# Patient Record
Sex: Male | Born: 1940
Health system: Southern US, Community
[De-identification: ages and names within clinical notes are randomized; demographics above are authoritative.]

## PROBLEM LIST (undated history)

## (undated) DIAGNOSIS — M1990 Unspecified osteoarthritis, unspecified site: Secondary | ICD-10-CM

## (undated) DIAGNOSIS — C679 Malignant neoplasm of bladder, unspecified: Secondary | ICD-10-CM

## (undated) DIAGNOSIS — K219 Gastro-esophageal reflux disease without esophagitis: Secondary | ICD-10-CM

## (undated) DIAGNOSIS — I10 Essential (primary) hypertension: Secondary | ICD-10-CM

## (undated) DIAGNOSIS — Z8719 Personal history of other diseases of the digestive system: Secondary | ICD-10-CM

## (undated) DIAGNOSIS — G4700 Insomnia, unspecified: Secondary | ICD-10-CM

## (undated) DIAGNOSIS — R011 Cardiac murmur, unspecified: Secondary | ICD-10-CM

## (undated) HISTORY — PX: JOINT REPLACEMENT: SHX530

## (undated) HISTORY — PX: TONSILLECTOMY: SUR1361

## (undated) HISTORY — PX: SECONDARY CLOSURE ARM: SHX5151

## (undated) HISTORY — PX: KNEE ARTHROSCOPY: SUR90

## (undated) HISTORY — PX: TRANSTHORACIC ECHOCARDIOGRAM: SHX275

## (undated) HISTORY — PX: OTHER SURGICAL HISTORY: SHX169

## (undated) HISTORY — PX: CARDIOVASCULAR STRESS TEST: SHX262

---

## 2000-09-21 ENCOUNTER — Emergency Department (HOSPITAL_COMMUNITY): Admission: EM | Admit: 2000-09-21 | Discharge: 2000-09-21 | Payer: Self-pay | Admitting: Emergency Medicine

## 2000-09-21 ENCOUNTER — Encounter: Payer: Self-pay | Admitting: Emergency Medicine

## 2001-04-01 ENCOUNTER — Encounter: Payer: Self-pay | Admitting: Internal Medicine

## 2001-04-01 ENCOUNTER — Encounter: Admission: RE | Admit: 2001-04-01 | Discharge: 2001-04-01 | Payer: Self-pay | Admitting: Internal Medicine

## 2001-07-16 ENCOUNTER — Encounter: Payer: Self-pay | Admitting: Orthopedic Surgery

## 2001-07-18 ENCOUNTER — Observation Stay (HOSPITAL_COMMUNITY): Admission: RE | Admit: 2001-07-18 | Discharge: 2001-07-19 | Payer: Self-pay | Admitting: Orthopedic Surgery

## 2001-07-18 HISTORY — PX: SHOULDER OPEN ROTATOR CUFF REPAIR: SHX2407

## 2005-04-16 ENCOUNTER — Emergency Department (HOSPITAL_COMMUNITY): Admission: EM | Admit: 2005-04-16 | Discharge: 2005-04-16 | Payer: Self-pay | Admitting: Family Medicine

## 2005-05-22 ENCOUNTER — Ambulatory Visit (HOSPITAL_COMMUNITY): Admission: RE | Admit: 2005-05-22 | Discharge: 2005-05-22 | Payer: Self-pay | Admitting: *Deleted

## 2005-07-12 ENCOUNTER — Encounter: Admission: RE | Admit: 2005-07-12 | Discharge: 2005-07-12 | Payer: Self-pay | Admitting: Internal Medicine

## 2005-11-23 ENCOUNTER — Ambulatory Visit (HOSPITAL_COMMUNITY): Admission: RE | Admit: 2005-11-23 | Discharge: 2005-11-23 | Payer: Self-pay | Admitting: Family Medicine

## 2005-11-23 ENCOUNTER — Emergency Department (HOSPITAL_COMMUNITY): Admission: EM | Admit: 2005-11-23 | Discharge: 2005-11-23 | Payer: Self-pay | Admitting: Family Medicine

## 2009-03-15 ENCOUNTER — Ambulatory Visit (HOSPITAL_COMMUNITY): Admission: RE | Admit: 2009-03-15 | Discharge: 2009-03-16 | Payer: Self-pay | Admitting: Orthopedic Surgery

## 2009-03-15 HISTORY — PX: OTHER SURGICAL HISTORY: SHX169

## 2010-02-19 ENCOUNTER — Emergency Department (HOSPITAL_COMMUNITY)
Admission: EM | Admit: 2010-02-19 | Discharge: 2010-02-19 | Payer: Self-pay | Source: Home / Self Care | Admitting: Emergency Medicine

## 2010-04-30 LAB — DIFFERENTIAL
Basophils Relative: 1 % (ref 0–1)
Eosinophils Absolute: 0.1 10*3/uL (ref 0.0–0.7)
Eosinophils Relative: 1 % (ref 0–5)
Lymphs Abs: 1.7 10*3/uL (ref 0.7–4.0)
Monocytes Relative: 5 % (ref 3–12)

## 2010-04-30 LAB — COMPREHENSIVE METABOLIC PANEL
ALT: 21 U/L (ref 0–53)
AST: 21 U/L (ref 0–37)
Alkaline Phosphatase: 51 U/L (ref 39–117)
CO2: 29 mEq/L (ref 19–32)
Calcium: 9.1 mg/dL (ref 8.4–10.5)
GFR calc Af Amer: >60 mL/min (ref >60)
Potassium: 4 mEq/L (ref 3.5–5.1)
Sodium: 140 mEq/L (ref 135–145)
Total Protein: 7.5 g/dL (ref 6.0–8.3)

## 2010-04-30 LAB — URINALYSIS, ROUTINE W REFLEX MICROSCOPIC
Bilirubin Urine: NEGATIVE
Glucose, UA: NEGATIVE mg/dL
Ketones, ur: NEGATIVE mg/dL
Leukocytes, UA: NEGATIVE
Nitrite: NEGATIVE
Protein, ur: NEGATIVE mg/dL
Specific Gravity, Urine: 1.011 (ref 1.005–1.030)
Urobilinogen, UA: 0.2 mg/dL (ref 0.0–1.0)
pH: 6.5 (ref 5.0–8.0)

## 2010-04-30 LAB — URINE MICROSCOPIC-ADD ON

## 2010-04-30 LAB — CBC
Hemoglobin: 13.6 g/dL (ref 13.0–17.0)
MCHC: 34.3 g/dL (ref 30.0–36.0)
RBC: 4.29 MIL/uL (ref 4.22–5.81)

## 2010-06-30 NOTE — Op Note (Signed)
[facility]  Patient:    Jeremiah Wright, Jeremiah Wright Visit Number: [id number] MRN: [id number]          Service Type: SUR Location: 4W [id number] [id number] Attending Physician:  Skip Mayer Dictated by:   Georges Lynch Darrelyn Hillock, M.D. Proc. Date: 07/18/01 Admit Date:  07/18/2001                             Operative Report  SURGEON:  Windy Fast A. Darrelyn Hillock, M.D.  ASSISTANTDruscilla Brownie. Underwood III, P.A.-C.  PREOPERATIVE DIAGNOSIS:  Complete retracted tear of the rotator cuff tendon, right shoulder with severe impingement syndrome.  POSTOPERATIVE DIAGNOSIS:  Complete retracted tear to the rotator cuff tendon, right shoulder with severe impingement syndrome.  OPERATION: 1. Repair of a complete tear of the right rotator cuff tendon utilizing the    graft-jacket tendon graft. 2. Partial acromionectomy and acromioplasty.  DESCRIPTION OF PROCEDURE:  Under general anesthesia, the patient first had 1 g of IV Ancef.  A routine orthopedic prep and draping of the right shoulder was carried out.  An incision was made over the anterior aspect of the right shoulder, and bleeders were identified and cauterized.  Note, prior to surgery, the patient had an interscalene block of his neck.  Once the incision was made, I went down to the deltoid tendon and stripped it off by sharp dissection from the acromion.  At this particular time, the muscle then was split proximally.  I then introduced the Bennett retractor and did an acromionectomy and acromioplasty utilizing the oscillating saw and a bur.  I then thoroughly irrigated out the area and bone waxed the undersurface of the acromion.  I did a bursectomy as well.  Note, he had a large, retracted tear of the rotator cuff tendon.  I then utilized the bur to bur down lateral articular surface of the humerus and also to bur off the spur that was present anteriorly.  Once this was done, I then utilized a 5 x 5 graft-jacket to repair the defect  in the rotator cuff tendon.  I also utilized two multi-tack sutures.  The tendon graft down into the bone.  I thoroughly irrigated out the area and then reapproximated the deltoid tendon and the muscle in the usual fashion, and the subcu was closed in the usual fashion.  Skin was closed with metal staples, and a sterile Neosporin dressing was applied, and he was placed in a shoulder immobilizer. Dictated by:   Georges Lynch Darrelyn Hillock, M.D. Attending Physician:  Skip Mayer DD:  07/18/01 TD:  07/21/01 Job: [id number]

## 2010-06-30 NOTE — Op Note (Signed)
NAMENUEL, DEJAYNES                  ACCOUNT NO.:  [id number]   MEDICAL RECORD NO.:  [id number]          PATIENT TYPE:  AMB   LOCATION:  ENDO                         FACILITY:  [facility]   PHYSICIAN:  Georgiana Spinner, M.D.    DATE OF BIRTH:  [birth year]   DATE OF PROCEDURE:  05/22/2005  DATE OF DISCHARGE:                                 OPERATIVE REPORT   PROCEDURE:  Colonoscopy.   INDICATIONS:  Colon cancer screening.   PROCEDURE:  With the patient mildly sedated in the left lateral decubitus  position, a rectal examination was performed which revealed trace positive  material.  The prostate could not be well felt.  Subsequently, the Olympus  videoscopic colonoscope was inserted into the rectum and passed under direct  vision.  With pressure applied and position change, we reached the cecum  identified by the ileocecal valve and base of cecum both of which were  photographed.  From this point, the colonoscope was slowly withdrawn, taking  circumferential views of the colonic mucosa, stopping to photograph  significant diverticulosis of the sigmoid colon until we reached the rectum  which appeared normal under direct and showed small hemorrhoids on  retroflexed view.  The endoscope was straightened and withdrawn.  The  patient's vital signs and pulse oximeter remained stable.  The patient  tolerated the procedure well with no apparent complications.   FINDINGS:  Diverticulosis of sigmoid colon.  Internal hemorrhoids.  Otherwise unremarkable examination.   PLAN:  Consider repeat exam in 5 years.           ______________________________  Georgiana Spinner, M.D.     GMO/MEDQ  D:  05/22/2005  T:  05/22/2005  Job:  [id number]

## 2010-08-15 ENCOUNTER — Other Ambulatory Visit: Payer: Self-pay | Admitting: Internal Medicine

## 2010-08-15 DIAGNOSIS — R112 Nausea with vomiting, unspecified: Secondary | ICD-10-CM

## 2010-08-18 ENCOUNTER — Ambulatory Visit
Admission: RE | Admit: 2010-08-18 | Discharge: 2010-08-18 | Disposition: A | Discharge status: Home / Self Care | Payer: 59 | Source: Ambulatory Visit | Attending: Internal Medicine | Admitting: Internal Medicine

## 2010-08-18 DIAGNOSIS — R112 Nausea with vomiting, unspecified: Secondary | ICD-10-CM

## 2011-10-29 ENCOUNTER — Other Ambulatory Visit: Payer: Self-pay | Admitting: Orthopedic Surgery

## 2011-11-20 ENCOUNTER — Ambulatory Visit (HOSPITAL_COMMUNITY)
Admission: RE | Admit: 2011-11-20 | Discharge: 2011-11-20 | Disposition: A | Discharge status: Home / Self Care | Payer: Medicare Other | Source: Ambulatory Visit | Attending: Orthopedic Surgery | Admitting: Orthopedic Surgery

## 2011-11-20 ENCOUNTER — Encounter (HOSPITAL_COMMUNITY): Payer: Self-pay | Admitting: Pharmacy Technician

## 2011-11-20 ENCOUNTER — Encounter (HOSPITAL_COMMUNITY): Payer: Self-pay

## 2011-11-20 ENCOUNTER — Encounter (HOSPITAL_COMMUNITY)
Admission: RE | Admit: 2011-11-20 | Discharge: 2011-11-20 | Disposition: A | Discharge status: Home / Self Care | Payer: Medicare Other | Source: Ambulatory Visit | Attending: Orthopedic Surgery | Admitting: Orthopedic Surgery

## 2011-11-20 DIAGNOSIS — S43429A Sprain of unspecified rotator cuff capsule, initial encounter: Secondary | ICD-10-CM | POA: Insufficient documentation

## 2011-11-20 DIAGNOSIS — I10 Essential (primary) hypertension: Secondary | ICD-10-CM | POA: Insufficient documentation

## 2011-11-20 DIAGNOSIS — X58XXXA Exposure to other specified factors, initial encounter: Secondary | ICD-10-CM | POA: Insufficient documentation

## 2011-11-20 DIAGNOSIS — Z01812 Encounter for preprocedural laboratory examination: Secondary | ICD-10-CM | POA: Insufficient documentation

## 2011-11-20 HISTORY — DX: Essential (primary) hypertension: I10

## 2011-11-20 HISTORY — DX: Unspecified osteoarthritis, unspecified site: M19.90

## 2011-11-20 HISTORY — DX: Gastro-esophageal reflux disease without esophagitis: K21.9

## 2011-11-20 LAB — PROTIME-INR
INR: 1.06 (ref 0.00–1.49)
Prothrombin Time: 13.7 seconds (ref 11.6–15.2)

## 2011-11-20 LAB — SURGICAL PCR SCREEN: Staphylococcus aureus: NEGATIVE

## 2011-11-20 LAB — ABO/RH: ABO/RH(D): A NEG

## 2011-11-20 NOTE — Progress Notes (Signed)
11/20/11 1515  OBSTRUCTIVE SLEEP APNEA  Have you ever been diagnosed with sleep apnea through a sleep study? No  Do you snore loudly (loud enough to be heard through closed doors)?  0  Do you often feel tired, fatigued, or sleepy during the daytime? 0  Has anyone observed you stop breathing during your sleep? 0  Do you have, or are you being treated for high blood pressure? 1  BMI more than 35 kg/m2? 1  Age over 70 years old? 1  Neck circumference greater than 40 cm/18 inches? 0  Gender: 1  Obstructive Sleep Apnea Score 4   Score 4 or greater  Results sent to PCP

## 2011-11-20 NOTE — Patient Instructions (Signed)
Jeremiah Wright  11/20/2011                           YOUR PROCEDURE IS SCHEDULED ON:  11/29/11 7:30 AM               PLEASE REPORT TO SHORT STAY CENTER AT :  5:15 AM               CALL THIS NUMBER IF ANY PROBLEMS THE DAY OF SURGERY :               [id number]                      REMEMBER:   Do not eat food or drink liquids AFTER MIDNIGHT    Take these medicines the morning of surgery with A SIP OF WATER:  PRILOSEC / TRAMADOL   Do not wear jewelry, make-up   Do not wear lotions, powders, or perfumes.   Do not shave legs or underarms 12 hrs. before surgery (men may shave face)  Do not bring valuables to the hospital.  Contacts, dentures or bridgework may not be worn into surgery.  Leave suitcase in the car. After surgery it may be brought to your room.  For patients admitted to the hospital more than one night, checkout time is 11:00                         AM the day of discharge.   Patients discharged the day of surgery will not be allowed to drive home                             If going home same day of surgery, must have someone stay with you first                           4 hrs at home and arrange for some one to drive you home from hospital.    Special Instructions:   Please read over the following fact sheets that you were given:               1. MRSA  INFORMATION                      2. [employer] PREPARING FOR SURGERY SHEET                3. INCENTIVE SPIROMETER                                                  X_____________________________________________________________________

## 2011-11-29 ENCOUNTER — Encounter (HOSPITAL_COMMUNITY): Payer: Self-pay | Admitting: Anesthesiology

## 2011-11-29 ENCOUNTER — Ambulatory Visit (HOSPITAL_COMMUNITY): Payer: Medicare Other | Admitting: Anesthesiology

## 2011-11-29 ENCOUNTER — Encounter (HOSPITAL_COMMUNITY): Payer: Self-pay | Admitting: Orthopedic Surgery

## 2011-11-29 ENCOUNTER — Encounter (HOSPITAL_COMMUNITY): Payer: Self-pay | Admitting: *Deleted

## 2011-11-29 ENCOUNTER — Inpatient Hospital Stay (HOSPITAL_COMMUNITY): Payer: Medicare Other

## 2011-11-29 ENCOUNTER — Inpatient Hospital Stay (HOSPITAL_COMMUNITY)
Admission: RE | Admit: 2011-11-29 | Discharge: 2011-11-30 | DRG: 484 | Disposition: A | Discharge status: Home / Self Care | Payer: Medicare Other | Source: Ambulatory Visit | Attending: Orthopedic Surgery | Admitting: Orthopedic Surgery

## 2011-11-29 ENCOUNTER — Encounter (HOSPITAL_COMMUNITY)
Admission: RE | Disposition: A | Discharge status: Home / Self Care | Payer: Self-pay | Source: Ambulatory Visit | Attending: Orthopedic Surgery

## 2011-11-29 ENCOUNTER — Ambulatory Visit (HOSPITAL_COMMUNITY): Payer: Medicare Other

## 2011-11-29 DIAGNOSIS — M19019 Primary osteoarthritis, unspecified shoulder: Principal | ICD-10-CM | POA: Diagnosis present

## 2011-11-29 DIAGNOSIS — I10 Essential (primary) hypertension: Secondary | ICD-10-CM | POA: Diagnosis present

## 2011-11-29 DIAGNOSIS — M19012 Primary osteoarthritis, left shoulder: Secondary | ICD-10-CM | POA: Diagnosis present

## 2011-11-29 HISTORY — PX: REVERSE SHOULDER ARTHROPLASTY: SHX5054

## 2011-11-29 LAB — TYPE AND SCREEN: ABO/RH(D): A NEG

## 2011-11-29 SURGERY — ARTHROPLASTY, SHOULDER, TOTAL, REVERSE
Anesthesia: General | Site: Shoulder | Laterality: Left | Wound class: Clean

## 2011-11-29 MED ORDER — SODIUM CHLORIDE 0.9 % IR SOLN
Status: DC | PRN
  Administered 2011-11-29: 1000 mL

## 2011-11-29 MED ORDER — PANTOPRAZOLE SODIUM 40 MG PO TBEC
40.0000 mg | DELAYED_RELEASE_TABLET | Freq: Every day | ORAL | Status: DC
  Administered 2011-11-30: 40 mg via ORAL
  Filled 2011-11-29: qty 1

## 2011-11-29 MED ORDER — ACETAMINOPHEN 325 MG PO TABS: 650.0000 mg | ORAL_TABLET | Freq: Four times a day (QID) | ORAL | Status: DC | PRN

## 2011-11-29 MED ORDER — METHOCARBAMOL 100 MG/ML IJ SOLN
500.0000 mg | Freq: Four times a day (QID) | INTRAVENOUS | Status: DC | PRN
  Administered 2011-11-29: 500 mg via INTRAVENOUS
  Filled 2011-11-29: qty 5

## 2011-11-29 MED ORDER — MEPERIDINE HCL 50 MG/ML IJ SOLN: 6.2500 mg | INTRAMUSCULAR | Status: DC | PRN

## 2011-11-29 MED ORDER — MENTHOL 3 MG MT LOZG
1.0000 | LOZENGE | OROMUCOSAL | Status: DC | PRN
  Administered 2011-11-29: 3 mg via ORAL
  Filled 2011-11-29: qty 9

## 2011-11-29 MED ORDER — ACETAMINOPHEN 10 MG/ML IV SOLN
INTRAVENOUS | Status: AC
  Filled 2011-11-29: qty 100

## 2011-11-29 MED ORDER — LIDOCAINE HCL 4 % MT SOLN
OROMUCOSAL | Status: DC | PRN
  Administered 2011-11-29: 4 mL via TOPICAL

## 2011-11-29 MED ORDER — DIPHENHYDRAMINE HCL 12.5 MG/5ML PO ELIX: 12.5000 mg | ORAL_SOLUTION | ORAL | Status: DC | PRN

## 2011-11-29 MED ORDER — METHOCARBAMOL 500 MG PO TABS
500.0000 mg | ORAL_TABLET | Freq: Four times a day (QID) | ORAL | Status: DC | PRN
  Administered 2011-11-29 – 2011-11-30 (×3): 500 mg via ORAL
  Filled 2011-11-29 (×3): qty 1

## 2011-11-29 MED ORDER — IRBESARTAN 150 MG PO TABS
150.0000 mg | ORAL_TABLET | Freq: Every day | ORAL | Status: DC
  Administered 2011-11-29 – 2011-11-30 (×2): 150 mg via ORAL
  Filled 2011-11-29 (×2): qty 1

## 2011-11-29 MED ORDER — MORPHINE SULFATE 2 MG/ML IJ SOLN: 2.0000 mg | INTRAMUSCULAR | Status: DC | PRN

## 2011-11-29 MED ORDER — ONDANSETRON HCL 4 MG/2ML IJ SOLN: 4.0000 mg | Freq: Four times a day (QID) | INTRAMUSCULAR | Status: DC | PRN

## 2011-11-29 MED ORDER — POVIDONE-IODINE 7.5 % EX SOLN: Freq: Once | CUTANEOUS | Status: DC

## 2011-11-29 MED ORDER — PROMETHAZINE HCL 25 MG/ML IJ SOLN: 6.2500 mg | INTRAMUSCULAR | Status: DC | PRN

## 2011-11-29 MED ORDER — HYDROMORPHONE HCL PF 1 MG/ML IJ SOLN
0.2500 mg | INTRAMUSCULAR | Status: DC | PRN
Start: 2011-11-29 — End: 2011-11-29
  Administered 2011-11-29 (×2): 0.5 mg via INTRAVENOUS

## 2011-11-29 MED ORDER — NEOSTIGMINE METHYLSULFATE 1 MG/ML IJ SOLN
INTRAMUSCULAR | Status: DC | PRN
  Administered 2011-11-29: 2 mg via INTRAVENOUS

## 2011-11-29 MED ORDER — PHENOL 1.4 % MT LIQD
1.0000 | OROMUCOSAL | Status: DC | PRN
  Filled 2011-11-29: qty 177

## 2011-11-29 MED ORDER — ACETAMINOPHEN 650 MG RE SUPP: 650.0000 mg | Freq: Four times a day (QID) | RECTAL | Status: DC | PRN

## 2011-11-29 MED ORDER — CEFAZOLIN SODIUM-DEXTROSE 2-3 GM-% IV SOLR
2.0000 g | Freq: Four times a day (QID) | INTRAVENOUS | Status: AC
  Administered 2011-11-29 – 2011-11-30 (×3): 2 g via INTRAVENOUS
  Filled 2011-11-29 (×3): qty 50

## 2011-11-29 MED ORDER — ROPIVACAINE HCL 5 MG/ML IJ SOLN
INTRAMUSCULAR | Status: AC
  Filled 2011-11-29: qty 30

## 2011-11-29 MED ORDER — LIDOCAINE HCL (CARDIAC) 20 MG/ML IV SOLN
INTRAVENOUS | Status: DC | PRN
  Administered 2011-11-29: 40 mg via INTRAVENOUS

## 2011-11-29 MED ORDER — DOCUSATE SODIUM 100 MG PO CAPS
100.0000 mg | ORAL_CAPSULE | Freq: Two times a day (BID) | ORAL | Status: DC
  Administered 2011-11-29 – 2011-11-30 (×3): 100 mg via ORAL

## 2011-11-29 MED ORDER — ONDANSETRON HCL 4 MG/2ML IJ SOLN
INTRAMUSCULAR | Status: DC | PRN
  Administered 2011-11-29: 4 mg via INTRAVENOUS

## 2011-11-29 MED ORDER — ROCURONIUM BROMIDE 100 MG/10ML IV SOLN
INTRAVENOUS | Status: DC | PRN
  Administered 2011-11-29: 50 mg via INTRAVENOUS

## 2011-11-29 MED ORDER — CEFAZOLIN SODIUM-DEXTROSE 2-3 GM-% IV SOLR
2.0000 g | INTRAVENOUS | Status: AC
  Administered 2011-11-29: 2 g via INTRAVENOUS

## 2011-11-29 MED ORDER — BISACODYL 5 MG PO TBEC: 5.0000 mg | DELAYED_RELEASE_TABLET | Freq: Every day | ORAL | Status: DC | PRN

## 2011-11-29 MED ORDER — FENTANYL CITRATE 0.05 MG/ML IJ SOLN
INTRAMUSCULAR | Status: DC | PRN
  Administered 2011-11-29: 50 ug via INTRAVENOUS

## 2011-11-29 MED ORDER — CEFAZOLIN SODIUM-DEXTROSE 2-3 GM-% IV SOLR
INTRAVENOUS | Status: AC
  Filled 2011-11-29: qty 50

## 2011-11-29 MED ORDER — HYDROMORPHONE HCL PF 1 MG/ML IJ SOLN
INTRAMUSCULAR | Status: AC
  Filled 2011-11-29: qty 1

## 2011-11-29 MED ORDER — LACTATED RINGERS IV SOLN
INTRAVENOUS | Status: DC | PRN
  Administered 2011-11-29 (×2): via INTRAVENOUS

## 2011-11-29 MED ORDER — ASPIRIN EC 325 MG PO TBEC
325.0000 mg | DELAYED_RELEASE_TABLET | Freq: Two times a day (BID) | ORAL | Status: DC
  Administered 2011-11-29 – 2011-11-30 (×3): 325 mg via ORAL
  Filled 2011-11-29 (×6): qty 1

## 2011-11-29 MED ORDER — ONDANSETRON HCL 4 MG PO TABS: 4.0000 mg | ORAL_TABLET | Freq: Four times a day (QID) | ORAL | Status: DC | PRN

## 2011-11-29 MED ORDER — HYDROCODONE-ACETAMINOPHEN 5-325 MG PO TABS
1.0000 | ORAL_TABLET | ORAL | Status: DC | PRN
  Administered 2011-11-29 – 2011-11-30 (×5): 2 via ORAL
  Filled 2011-11-29 (×5): qty 2

## 2011-11-29 MED ORDER — METOCLOPRAMIDE HCL 5 MG/ML IJ SOLN: 5.0000 mg | Freq: Three times a day (TID) | INTRAMUSCULAR | Status: DC | PRN

## 2011-11-29 MED ORDER — LACTATED RINGERS IV SOLN
INTRAVENOUS | Status: DC
Start: 2011-11-29 — End: 2011-11-29

## 2011-11-29 MED ORDER — SODIUM CHLORIDE 0.9 % IV SOLN
INTRAVENOUS | Status: DC
  Administered 2011-11-29 – 2011-11-30 (×3): via INTRAVENOUS

## 2011-11-29 MED ORDER — METOCLOPRAMIDE HCL 10 MG PO TABS: 5.0000 mg | ORAL_TABLET | Freq: Three times a day (TID) | ORAL | Status: DC | PRN

## 2011-11-29 MED ORDER — MIDAZOLAM HCL 5 MG/5ML IJ SOLN
INTRAMUSCULAR | Status: DC | PRN
  Administered 2011-11-29: 2 mg via INTRAVENOUS

## 2011-11-29 MED ORDER — ALUM & MAG HYDROXIDE-SIMETH 200-200-20 MG/5ML PO SUSP: 30.0000 mL | ORAL | Status: DC | PRN

## 2011-11-29 MED ORDER — PANTOPRAZOLE SODIUM 40 MG PO TBEC
40.0000 mg | DELAYED_RELEASE_TABLET | Freq: Every day | ORAL | Status: DC
  Filled 2011-11-29: qty 1

## 2011-11-29 MED ORDER — PROPOFOL 10 MG/ML IV BOLUS
INTRAVENOUS | Status: DC | PRN
  Administered 2011-11-29: 140 mg via INTRAVENOUS

## 2011-11-29 MED ORDER — FLEET ENEMA 7-19 GM/118ML RE ENEM: 1.0000 | ENEMA | Freq: Once | RECTAL | Status: AC | PRN

## 2011-11-29 MED ORDER — PHENYLEPHRINE HCL 10 MG/ML IJ SOLN
INTRAMUSCULAR | Status: DC | PRN
  Administered 2011-11-29 (×2): 40 ug via INTRAVENOUS

## 2011-11-29 MED ORDER — ROPIVACAINE HCL 5 MG/ML IJ SOLN
INTRAMUSCULAR | Status: DC | PRN
  Administered 2011-11-29: 30 mL

## 2011-11-29 MED ORDER — OXYCODONE-ACETAMINOPHEN 5-325 MG PO TABS: 1.0000 | ORAL_TABLET | ORAL | Status: DC | PRN

## 2011-11-29 MED ORDER — GLYCOPYRROLATE 0.2 MG/ML IJ SOLN
INTRAMUSCULAR | Status: DC | PRN
  Administered 2011-11-29 (×2): 0.2 mg via INTRAVENOUS

## 2011-11-29 MED ORDER — ACETAMINOPHEN 10 MG/ML IV SOLN
INTRAVENOUS | Status: DC | PRN
  Administered 2011-11-29: 1000 mg via INTRAVENOUS

## 2011-11-29 MED ORDER — POLYETHYLENE GLYCOL 3350 17 G PO PACK: 17.0000 g | PACK | Freq: Every day | ORAL | Status: DC | PRN

## 2011-11-29 MED ORDER — ZOLPIDEM TARTRATE 10 MG PO TABS
10.0000 mg | ORAL_TABLET | Freq: Every evening | ORAL | Status: DC | PRN
  Administered 2011-11-29: 10 mg via ORAL
  Filled 2011-11-29: qty 1

## 2011-11-29 SURGICAL SUPPLY — 75 items
BAG ZIPLOCK 12X15 (MISCELLANEOUS) ×2 IMPLANT
BIT DRILL 170X2.5X (BIT) ×1 IMPLANT
BIT DRL 170X2.5X (BIT) ×1
BLADE SAW SAG 73X25 THK (BLADE) ×1
BLADE SAW SGTL 73X25 THK (BLADE) ×1 IMPLANT
BLADE SURG 15 STRL LF DISP TIS (BLADE) ×1 IMPLANT
BLADE SURG 15 STRL SS (BLADE) ×1
BNDG COHESIVE 4X5 TAN STRL (GAUZE/BANDAGES/DRESSINGS) ×2 IMPLANT
BOOTIES KNEE HIGH SLOAN (MISCELLANEOUS) ×4 IMPLANT
CHLORAPREP W/TINT 26ML (MISCELLANEOUS) ×2 IMPLANT
CLEANER TIP ELECTROSURG 2X2 (MISCELLANEOUS) ×2 IMPLANT
CLOTH BEACON ORANGE TIMEOUT ST (SAFETY) ×2 IMPLANT
CLSR STERI-STRIP ANTIMIC 1/2X4 (GAUZE/BANDAGES/DRESSINGS) ×2 IMPLANT
COVER SURGICAL LIGHT HANDLE (MISCELLANEOUS) IMPLANT
DRAPE INCISE IOBAN 66X45 STRL (DRAPES) ×4 IMPLANT
DRAPE ORTHO SPLIT 77X108 STRL (DRAPES) ×1
DRAPE POUCH INSTRU U-SHP 10X18 (DRAPES) ×2 IMPLANT
DRAPE SURG 17X11 SM STRL (DRAPES) ×2 IMPLANT
DRAPE SURG ORHT 6 SPLT 77X108 (DRAPES) ×1 IMPLANT
DRAPE TABLE BACK 44X90 PK DISP (DRAPES) ×2 IMPLANT
DRAPE U-SHAPE 47X51 STRL (DRAPES) ×2 IMPLANT
DRILL 2.5 (BIT) ×1
DRSG ADAPTIC 3X8 NADH LF (GAUZE/BANDAGES/DRESSINGS) IMPLANT
DRSG MEPILEX BORDER 4X4 (GAUZE/BANDAGES/DRESSINGS) IMPLANT
DRSG MEPILEX BORDER 4X8 (GAUZE/BANDAGES/DRESSINGS) ×2 IMPLANT
DRSG PAD ABDOMINAL 8X10 ST (GAUZE/BANDAGES/DRESSINGS) IMPLANT
ELECT BLADE 6.5 EXT (BLADE) ×2 IMPLANT
ELECT BLADE TIP CTD 4 INCH (ELECTRODE) ×2 IMPLANT
ELECT REM PT RETURN 9FT ADLT (ELECTROSURGICAL) ×2
ELECTRODE REM PT RTRN 9FT ADLT (ELECTROSURGICAL) ×1 IMPLANT
EVACUATOR 1/8 PVC DRAIN (DRAIN) ×2 IMPLANT
FACESHIELD LNG OPTICON STERILE (SAFETY) ×4 IMPLANT
GLOVE BIO SURGEON STRL SZ 6.5 (GLOVE) ×2 IMPLANT
GLOVE BIO SURGEON STRL SZ7.5 (GLOVE) IMPLANT
GLOVE BIOGEL PI IND STRL 7.0 (GLOVE) IMPLANT
GLOVE BIOGEL PI IND STRL 7.5 (GLOVE) ×1 IMPLANT
GLOVE BIOGEL PI IND STRL 8 (GLOVE) IMPLANT
GLOVE BIOGEL PI INDICATOR 7.0 (GLOVE)
GLOVE BIOGEL PI INDICATOR 7.5 (GLOVE) ×1
GLOVE BIOGEL PI INDICATOR 8 (GLOVE)
GLOVE INDICATOR 6.5 STRL GRN (GLOVE) ×2 IMPLANT
GLOVE ORTHO TXT STRL SZ7.5 (GLOVE) ×6 IMPLANT
GLOVE SURG ORTHO 7.0 STRL STRW (GLOVE) IMPLANT
GLOVE SURG SS PI 7.5 STRL IVOR (GLOVE) ×2 IMPLANT
GOWN STRL NON-REIN LRG LVL3 (GOWN DISPOSABLE) ×4 IMPLANT
GOWN STRL REIN XL XLG (GOWN DISPOSABLE) ×4 IMPLANT
HANDPIECE INTERPULSE COAX TIP (DISPOSABLE) ×1
HOOD PEEL AWAY FACE SHEILD DIS (HOOD) ×4 IMPLANT
KIT BASIN OR (CUSTOM PROCEDURE TRAY) ×2 IMPLANT
MANIFOLD NEPTUNE II (INSTRUMENTS) ×2 IMPLANT
NEEDLE MA TROC 1/2 (NEEDLE) ×2 IMPLANT
NS IRRIG 1000ML POUR BTL (IV SOLUTION) ×2 IMPLANT
PACK SHOULDER CUSTOM OPM052 (CUSTOM PROCEDURE TRAY) ×2 IMPLANT
PIN GUIDE 1.2 (PIN) ×2 IMPLANT
PIN GUIDE GLENOPHERE 1.5MX300M (PIN) ×2 IMPLANT
PIN METAGLENE 2.5 (PIN) ×2 IMPLANT
POSITIONER SURGICAL ARM (MISCELLANEOUS) ×2 IMPLANT
RETRIEVER SUT HEWSON (MISCELLANEOUS) ×2 IMPLANT
SET HNDPC FAN SPRY TIP SCT (DISPOSABLE) ×1 IMPLANT
SET PAD SHOULDER ACCESS (MISCELLANEOUS) ×2 IMPLANT
SLING ARM FOAM STRAP LRG (SOFTGOODS) ×2 IMPLANT
SLING ARM IMMOBILIZER LRG (SOFTGOODS) IMPLANT
SLING ARM IMMOBILIZER MED (SOFTGOODS) IMPLANT
SPONGE GAUZE 4X4 12PLY (GAUZE/BANDAGES/DRESSINGS) ×2 IMPLANT
SPONGE LAP 18X18 X RAY DECT (DISPOSABLE) ×4 IMPLANT
STRIP CLOSURE SKIN 1/2X4 (GAUZE/BANDAGES/DRESSINGS) ×4 IMPLANT
SUCTION FRAZIER TIP 10 FR DISP (SUCTIONS) ×2 IMPLANT
SUT ETHIBOND 2 OS 4 DA (SUTURE) ×4 IMPLANT
SUT MNCRL AB 4-0 PS2 18 (SUTURE) ×2 IMPLANT
SUT VIC AB 0 CT1 36 (SUTURE) IMPLANT
SUT VIC AB 2-0 CT1 27 (SUTURE) ×2
SUT VIC AB 2-0 CT1 TAPERPNT 27 (SUTURE) ×2 IMPLANT
TOWEL OR 17X26 10 PK STRL BLUE (TOWEL DISPOSABLE) ×4 IMPLANT
TOWER CARTRIDGE SMART MIX (DISPOSABLE) IMPLANT
WATER STERILE IRR 1500ML POUR (IV SOLUTION) ×2 IMPLANT

## 2011-11-29 NOTE — Anesthesia Procedure Notes (Signed)
Anesthesia Regional Block:  Supraclavicular block  Pre-Anesthetic Checklist: ,, timeout performed, Correct Patient, Correct Site, Correct Laterality, Correct Procedure, Correct Position, site marked, Risks and benefits discussed,  Surgical consent,  Pre-op evaluation,  At surgeon's request and post-op pain management  Laterality: Left and Upper  Prep: chloraprep       Needles:  Injection technique: Single-shot  Needle Type: Stimiplex     Needle Length: 10cm 10 cm Needle Gauge: 20 and 20 G    Additional Needles:  Procedures: ultrasound guided and nerve stimulator Supraclavicular block Narrative:  Injection made incrementally with aspirations every 5 mL.  Performed by: Personally  Anesthesiologist: Phillips Grout MD  Additional Notes: Risks, benefits and alternative to block explained extensively.  Patient tolerated procedure well, without complications.  Supraclavicular block

## 2011-11-29 NOTE — Progress Notes (Signed)
PACU NOTE; Dr Ave Filter called into PACU and ordered repeat shoulder XRAY, order placed and radiology notified.

## 2011-11-29 NOTE — Anesthesia Preprocedure Evaluation (Addendum)
Anesthesia Evaluation  Patient identified by MRN, date of birth, ID band Patient awake    Reviewed: Allergy & Precautions, H&P , NPO status , Patient's Chart, lab work & pertinent test results  Airway Mallampati: II TM Distance: >3 FB Neck ROM: Full    Dental No notable dental hx.    Pulmonary neg pulmonary ROS,  breath sounds clear to auscultation  Pulmonary exam normal       Cardiovascular hypertension, Pt. on medications negative cardio ROS  Rhythm:Regular Rate:Normal     Neuro/Psych negative neurological ROS  negative psych ROS   GI/Hepatic negative GI ROS, Neg liver ROS,   Endo/Other  negative endocrine ROS  Renal/GU negative Renal ROS  negative genitourinary   Musculoskeletal negative musculoskeletal ROS (+)   Abdominal   Peds negative pediatric ROS (+)  Hematology negative hematology ROS (+)   Anesthesia Other Findings   Reproductive/Obstetrics negative OB ROS                           Anesthesia Physical Anesthesia Plan  ASA: II  Anesthesia Plan: General   Post-op Pain Management:    Induction: Intravenous  Airway Management Planned: Oral ETT  Additional Equipment:   Intra-op Plan:   Post-operative Plan: Extubation in OR  Informed Consent: I have reviewed the patients History and Physical, chart, labs and discussed the procedure including the risks, benefits and alternatives for the proposed anesthesia with the patient or authorized representative who has indicated his/her understanding and acceptance.   Dental advisory given  Plan Discussed with: CRNA  Anesthesia Plan Comments: (SCB)       Anesthesia Quick Evaluation

## 2011-11-29 NOTE — H&P (Signed)
Jeremiah Wright is an 71 y.o. male.   Chief Complaint: L shoudler pain /dysfunction  HPI: L shoulder rotator cuff tear arthropathy after failed RCR.  Pain and dsyfunction limiting quality of life.  Failed non op tx of injections, NSAIDS, activity modification.  Past Medical History  Diagnosis Date  . Hypertension   . Arthritis   . GERD (gastroesophageal reflux disease)   . Diverticulitis   . Difficulty sleeping     TAKES MED NIGHTLY    Past Surgical History  Procedure Date  . Shoulder surgery     RIGHT / LEFT  . Knee arthroscopy >10 YRS AGO    BIL  . Fatty tumor     REMOVED FROM BACK  . Secondary closure arm     RT ARM INJURY    History reviewed. No pertinent family history. Social History:  reports that he quit smoking about 4 years ago. He does not have any smokeless tobacco history on file. He reports that he does not drink alcohol or use illicit drugs.  Allergies: No Known Allergies  Medications Prior to Admission  Medication Sig Dispense Refill  . omeprazole (PRILOSEC) 20 MG capsule Take 20 mg by mouth daily.      Marland Kitchen trolamine salicylate (ASPERCREME) 10 % cream Apply 1 application topically as needed. For muscle aches on shoulders      . valsartan (DIOVAN) 160 MG tablet Take 160 mg by mouth every morning.      . cefdinir (OMNICEF) 300 MG capsule Take 300 mg by mouth every 12 (twelve) hours. For 7 days      . traMADol (ULTRAM) 50 MG tablet Take 50-100 mg by mouth every 6 (six) hours as needed. For pain      . zolpidem (AMBIEN) 10 MG tablet Take 10 mg by mouth at bedtime as needed. For sleep        No results found for this or any previous visit (from the past 48 hour(s)). No results found.  Review of Systems  All other systems reviewed and are negative.    Blood pressure 150/88, pulse 78, temperature 97.5 F (36.4 C), temperature source Oral, resp. rate 18, SpO2 97.00%. Physical Exam  Constitutional: He is oriented to person, place, and time. He appears  well-developed and well-nourished.  HENT:  Head: Atraumatic.  Eyes: EOM are normal.  Cardiovascular: Intact distal pulses.   Respiratory: Effort normal.  Musculoskeletal:       Left shoulder: He exhibits decreased range of motion, tenderness and crepitus. He exhibits no swelling.  Neurological: He is alert and oriented to person, place, and time.  Skin: Skin is warm and dry.  Psychiatric: He has a normal mood and affect.     Assessment/Plan L rotator cuff tear arthropathy Plan L reverse TSA Risks / benefits of surgery discussed Consent on chart  NPO for OR Preop antibiotics   Allexus Ovens WILLIAM 11/29/2011, 7:30 AM

## 2011-11-29 NOTE — Anesthesia Postprocedure Evaluation (Signed)
  Anesthesia Post-op Note  Patient: Jeremiah Wright  Procedure(s) Performed: Procedure(s) (LRB): REVERSE SHOULDER ARTHROPLASTY (Left)  Patient Location: PACU  Anesthesia Type: GA combined with regional for post-op pain  Level of Consciousness: awake and alert   Airway and Oxygen Therapy: Patient Spontanous Breathing  Post-op Pain: mild  Post-op Assessment: Post-op Vital signs reviewed, Patient's Cardiovascular Status Stable, Respiratory Function Stable, Patent Airway and No signs of Nausea or vomiting  Post-op Vital Signs: stable  Complications: No apparent anesthesia complications

## 2011-11-29 NOTE — Transfer of Care (Signed)
Immediate Anesthesia Transfer of Care Note  Patient: Jeremiah Wright  Procedure(s) Performed: Procedure(s) (LRB) with comments: REVERSE SHOULDER ARTHROPLASTY (Left) - Left Reverse Total Shoulder Arthroplasty with left supra clavicular block  Patient Location: PACU  Anesthesia Type: General  Level of Consciousness: awake, alert , oriented and patient cooperative  Airway & Oxygen Therapy: Patient Spontanous Breathing and Patient connected to face mask oxygen  Post-op Assessment: Report given to PACU RN and Post -op Vital signs reviewed and stable  Post vital signs: Reviewed and stable  Complications: No apparent anesthesia complications

## 2011-11-29 NOTE — Op Note (Signed)
Procedure(s): LEFT REVERSE SHOULDER ARTHROPLASTY Procedure Note  Jeremiah Wright male 71 y.o. 11/29/2011  Procedure(s) and Anesthesia Type:    * Left REVERSE SHOULDER ARTHROPLASTY - General   Indications:  71 y.o. male  With severe left shoulder arthritis with irrepairable rotator cuff tear. Pain and dysfunction interfered with quality of life and nonoperative treatment with activity modification, NSAIDS and injections failed.     Surgeon: Mable Paris   Assistants: Damita Lack PA-C [facility] was present and scrubbed throughout the procedure and was essential in positioning, retraction, exposure, and closure)  Anesthesia: General endotracheal anesthesia with preoperative interscalene block     Procedure Detail  REVERSE SHOULDER ARTHROPLASTY   Estimated Blood Loss:  200 mL         Drains: 1 medium hemovac  Blood Given: none          Specimens: none        Complications:  * No complications entered in OR log *         Disposition: PACU - hemodynamically stable.         Condition: stable      OPERATIVE FINDINGS:  A DePuy press-fit  reverse total shoulder arthroplasty was placed with a  size 10 stem, a 42 eccentric glenosphere, and a 3-mm poly insert. The base plate  fixation was excellent.  PROCEDURE: The patient was identified in the preoperative holding area  where I personally marked the operative site after verifying site, side,  and procedure with the patient. An interscalene block given by  the attending anesthesiologist in the holding area and the patient was taken back to the operating room where all extremities were  carefully padded in position after general anesthesia was induced. She  was placed in a beach-chair position and the operative upper extremity was  prepped and draped in a standard sterile fashion. An approximately 10-  cm incision was made from the tip of the coracoid process to the center  point of the humerus at the level  of the axilla. Dissection was carried  down through subcutaneous tissues to the level of the cephalic vein  which was taken laterally with the deltoid. The pectoralis major was  retracted medially. The subdeltoid space was developed and the lateral  edge of the conjoined tendon was identified. The undersurface of  conjoined tendon was palpated and the musculocutaneous nerve was not in  the field. Retractor was placed underneath the conjoined and second  retractor was placed lateral into the deltoid. The circumflex humeral  artery and vessels were identified and clamped and coagulated. The  biceps tendon was tenodesed to the upper border of the pectoralis major.  The subscapularis was torn.  The  joint was then gently externally rotated while the capsule was released  from the humeral neck around to just beyond the 6 o'clock position. At  this point, the joint was dislocated and the humeral head was presented  into the wound. The excessive osteophyte formation was removed with a  large rongeur and the intramedullary canal was then entered with  sequential reamers up to a 10-mm reamer which was felt to be the  appropriate size. The cutting guide was used to make the appropriate  head cut and the head was saved potentially bone grafting.  The proximal metaphyseal reaming guide was then placed and the appropriate size reamer was selected. The metaphyseal bone was then reamed. The trial implant was then placed. The trial was then left in place while the glenoid  was exposed with the arm in an  abducted extended position. The anterior and posterior labrum were  completely excised and the capsule was released circumferentially to  allow for exposure of the glenoid for preparation. The guidepin was  placed using the guide in neutral angulation and the reamer was  placed over the guidepin to ream down to concentric surface. Superior  hand reamer was used as well. The central drill hole was then made and   stayed within the glenoid vault. The Metaglene was then impacted with  Excellent press fit. The superior and inferior screws were then  drilled, measured, and filled with appropriate-sized locking screw  alternatively tightening top and bottom to bring the Metaglene down  tightly against the bone. Posterior and anterior screws were then placed. Fixation was excellent.  The humerus was then again exposed and the trial implant was removed. The gleno sphere was placed in the metaphysis of the proximal humerus and then reduced to the base plate.  The glenosphere was then impacted over the [facility] taper and tightened down  with the screw. The trial implant was then again placed in the proximal humerus and the poly trials were tested and the above implant was felt to be the most appropriate soft tissue tensioning with excellent stability and  excellent range of motion. Therefore, final humeral stem was placed with excellent press fit.  And then the trial polyethylene inserts were tested again and the above implant was felt to be the most appropriate for final insertion. The joint was reduced taken through full range of motion and felt to be extremely stable. Soft tissue tension was appropriate. A medium Hemovac drain was placed out underneath the deltoid prior to closure. The joint was then copiously irrigated with pulse  lavage and the wound was then closed. The subscapularis was not repairable.  Skin was closed with 2-0 Vicryl in a deep dermal layer and 4-0  Monocryl for skin closure. Steri-Strips were applied.terile  dressings were then applied as well as a sling. The patient was allowed  to awaken from general anesthesia, transferred to stretcher, and taken  to recovery room in stable condition.   POSTOPERATIVE PLAN: The patient will be kept in the hospital 1-2 days postoperatively  for pain control and therapy.

## 2011-11-29 NOTE — Preoperative (Signed)
Beta Blockers   Reason not to administer Beta Blockers:Not Applicable 

## 2011-11-30 ENCOUNTER — Encounter (HOSPITAL_COMMUNITY): Payer: Self-pay | Admitting: Orthopedic Surgery

## 2011-11-30 DIAGNOSIS — M19012 Primary osteoarthritis, left shoulder: Secondary | ICD-10-CM | POA: Diagnosis present

## 2011-11-30 LAB — CBC
MCV: 89.8 fL (ref 78.0–100.0)
Platelets: 215 10*3/uL (ref 150–400)
RBC: 3.25 MIL/uL — ABNORMAL LOW (ref 4.22–5.81)
RDW: 12 % (ref 11.5–15.5)
WBC: 7.5 10*3/uL (ref 4.0–10.5)

## 2011-11-30 LAB — BASIC METABOLIC PANEL
Chloride: 104 mEq/L (ref 96–112)
Creatinine, Ser: 1.35 mg/dL (ref 0.50–1.35)
GFR calc Af Amer: 60 mL/min — ABNORMAL LOW (ref >90)
Potassium: 3.7 mEq/L (ref 3.5–5.1)
Sodium: 136 mEq/L (ref 135–145)

## 2011-11-30 MED ORDER — OXYCODONE-ACETAMINOPHEN 5-325 MG PO TABS: 1.0000 | ORAL_TABLET | ORAL | Status: DC | PRN

## 2011-11-30 NOTE — Progress Notes (Signed)
Utilization review completed.  

## 2011-11-30 NOTE — Discharge Summary (Signed)
Patient ID: Jeremiah Wright MRN: [id number] DOB/AGE: 03/29/1940 71 y.o.  Admit date: 11/29/2011 Discharge date: 11/30/2011  Admission Diagnoses:  Principal Problem:  *Arthritis of shoulder region, left   Discharge Diagnoses:  Same  Past Medical History  Diagnosis Date  . Hypertension   . Arthritis   . GERD (gastroesophageal reflux disease)   . Diverticulitis   . Difficulty sleeping     TAKES MED NIGHTLY    Surgeries: Procedure(s): REVERSE SHOULDER ARTHROPLASTY on 11/29/2011   Consultants:    Discharged Condition: Improved  Hospital Course: ISSAI WERLING is an 71 y.o. male who was admitted 11/29/2011 for operative treatment ofArthritis of shoulder region, left. Patient has severe unremitting pain that affects sleep, daily activities, and work/hobbies. After pre-op clearance the patient was taken to the operating room on 11/29/2011 and underwent  Procedure(s): REVERSE SHOULDER ARTHROPLASTY.    Patient was given perioperative antibiotics: Anti-infectives     Start     Dose/Rate Route Frequency Ordered Stop   11/29/11 1400   ceFAZolin (ANCEF) IVPB 2 g/50 mL premix        2 g 100 mL/hr over 30 Minutes Intravenous Every 6 hours 11/29/11 1150 11/30/11 0209   11/29/11 0526   ceFAZolin (ANCEF) IVPB 2 g/50 mL premix        2 g 100 mL/hr over 30 Minutes Intravenous 60 min pre-op 11/29/11 0526 11/29/11 0748           Patient was given sequential compression devices, early ambulation, and ASA 325mg  BID to prevent DVT.  Patient benefited maximally from hospital stay and there were no complications.    Recent vital signs: Patient Vitals for the past 24 hrs:  BP Temp Temp src Pulse Resp SpO2 Height Weight  11/30/11 0602 115/73 mmHg 98.4 F (36.9 C) Oral 80  16  97 % - -  11/30/11 0129 113/73 mmHg 98.7 F (37.1 C) Oral 82  18  95 % - -  11/29/11 2121 108/67 mmHg 98.3 F (36.8 C) Oral 67  16  97 % - -  11/29/11 1805 119/70 mmHg 98.3 F (36.8 C) Oral 66  16  100 % - -    11/29/11 1440 117/77 mmHg 98.3 F (36.8 C) Oral 68  16  100 % - -  11/29/11 1300 112/70 mmHg 97.6 F (36.4 C) Oral 68  16  100 % - -  11/29/11 1240 119/71 mmHg 97.3 F (36.3 C) Oral 66  16  100 % - -  11/29/11 1140 132/79 mmHg 97.4 F (36.3 C) Oral 67  16  100 % 5\' 6"  (1.676 m) 102.059 kg (225 lb)  11/29/11 1115 132/74 mmHg 97.3 F (36.3 C) - 69  22  97 % - -  11/29/11 1100 125/73 mmHg - - 59  14  100 % - -  11/29/11 1045 130/74 mmHg - - 59  20  100 % - -  11/29/11 1030 135/77 mmHg 98 F (36.7 C) - 60  13  100 % - -     Recent laboratory studies:  Basename 11/30/11 0345  WBC 7.5  HGB 9.8*  HCT 29.2*  PLT 215  NA 136  K 3.7  CL 104  CO2 25  BUN 12  CREATININE 1.35  GLUCOSE 118*  INR --  CALCIUM 7.9*     Discharge Medications:     Medication List     As of 11/30/2011  8:14 AM    STOP taking these medications  cefdinir 300 MG capsule   Commonly known as: OMNICEF      TAKE these medications         omeprazole 20 MG capsule   Commonly known as: PRILOSEC   Take 20 mg by mouth daily.      oxyCODONE-acetaminophen 5-325 MG per tablet   Commonly known as: PERCOCET/ROXICET   Take 1-2 tablets by mouth every 4 (four) hours as needed for pain.      traMADol 50 MG tablet   Commonly known as: ULTRAM   Take 50-100 mg by mouth every 6 (six) hours as needed. For pain      trolamine salicylate 10 % cream   Commonly known as: ASPERCREME   Apply 1 application topically as needed. For muscle aches on shoulders      valsartan 160 MG tablet   Commonly known as: DIOVAN   Take 160 mg by mouth every morning.      zolpidem 10 MG tablet   Commonly known as: AMBIEN   Take 10 mg by mouth at bedtime as needed. For sleep        Diagnostic Studies: Dg Chest 2 View  11/20/2011  *RADIOLOGY REPORT*  Clinical Data: Hypertension  CHEST - 2 VIEW  Comparison: September 02, 2008  Findings: Lungs clear.  Heart size and pulmonary vascularity are normal.  No adenopathy.  No bone  lesions.  IMPRESSION: Lungs clear.   Original Report Authenticated By: Arvin Collard. WOODRUFF III, M.D.    Dg Shoulder 1v Left  11/29/2011  **ADDENDUM** CREATED: 11/29/2011 11:13:33  A repeat view obtained at 1106 hours contains the entirety of the humeral component of the left shoulder prosthesis. No fracture or acute complication identified.  **END ADDENDUM** SIGNED BY: Loraine Leriche A. Tyron Russell, M.D.   11/29/2011  *RADIOLOGY REPORT*  Clinical Data: Post reverse shoulder arthroplasty  LEFT SHOULDER - 1 VIEW  Comparison: Portable exam 1042 hours compared to 10/16/2011  Findings: Humeral and glenoid components of a left shoulder joint replacement identified. No acute fracture or dislocation. Chronic bony resorption at distal left clavicle. Osseous mineralization grossly normal. Surgical drain identified. Visualized ribs intact.  IMPRESSION: Left shoulder prosthesis without identified acute complication. Chronic bone resorption at the distal left clavicle.   Original Report Authenticated By: Lollie Marrow, M.D.     Disposition:       Discharge Orders    Future Orders Please Complete By Expires   Diet - low sodium heart healthy      Call MD / Call 911      Comments:   If you experience chest pain or shortness of breath, CALL 911 and be transported to the hospital emergency room.  If you develope a fever above 101 F, pus (white drainage) or increased drainage or redness at the wound, or calf pain, call your surgeon's office.   Constipation Prevention      Comments:   Drink plenty of fluids.  Prune juice may be helpful.  You may use a stool softener, such as Colace (over the counter) 100 mg twice a day.  Use MiraLax (over the counter) for constipation as needed.   Increase activity slowly as tolerated      Driving restrictions      Comments:   No driving until cleared by physician.   Lifting restrictions      Comments:   No lifting with left arm until cleared by physician.      Follow-up Information      Follow up with  Mable Paris, MD. Schedule an appointment as soon as possible for a visit in 2 weeks.   Contact information:   [facility] [address] [place] [address] [phone]           Signed: Jiles Harold 11/30/2011, 8:14 AM

## 2011-11-30 NOTE — Evaluation (Signed)
Occupational Therapy Evaluation Patient Details Name: Jeremiah Wright MRN: [id number] DOB: [birth year] Today's Date: 11/30/2011 Time: 6578-4696 OT Time Calculation (min): 18 min  OT Assessment / Plan / Recommendation Clinical Impression  Pt is s/p shoulder surgery and all education for OT completed. No follow OT needed. Wife can assist at home.     OT Assessment  Patient does not need any further OT services    Follow Up Recommendations  No OT follow up;Other (comment) (when MD progresses shoulder therapy)    Barriers to Discharge      Equipment Recommendations  None recommended by OT    Recommendations for Other Services    Frequency       Precautions / Restrictions Precautions Precautions: Shoulder Type of Shoulder Precautions: L UE sling Precaution Comments: issued shoulder care handout and reviewed with pt Required Braces or Orthoses: Other Brace/Splint (L shoulder sling) Restrictions Weight Bearing Restrictions: Yes LUE Weight Bearing: Non weight bearing        ADL       OT Diagnosis:    OT Problem List:   OT Treatment Interventions:     OT Goals    Visit Information  Last OT Received On: 11/30/11 Assistance Needed: +1    Subjective Data  Subjective: My wife is bringing a button down shirt Patient Stated Goal: home   Prior Functioning     Home Living Lives With: Spouse         Vision/Perception     Cognition       Extremity/Trunk Assessment       Mobility       Shoulder Instructions Donning/doffing shirt without moving shoulder: Patient able to independently direct caregiver Method for sponge bathing under operated UE: Patient able to independently direct caregiver Donning/doffing sling/immobilizer: Patient able to independently direct caregiver Correct positioning of sling/immobilizer: Patient able to independently direct caregiver ROM for elbow, wrist and digits of operated UE: Minimal assistance (gave a little elbow support for  elbow exercises) Sling wearing schedule (on at all times/off for ADL's): Patient able to independently direct caregiver Proper positioning of operated UE when showering: Patient able to independently direct caregiver Positioning of UE while sleeping: Patient able to independently direct caregiver   Exercise Other Exercises Other Exercises: Pt performed wrist and digit flexion/extension X 10 and elbow flexion/extension about 5-6 reps and then reported pain increasing so stopped these exercises for now. Encouraged pt to perform elbow, wrist and hand exercises 10 reps, 3 sets per day as tolerated.   Balance     End of Session OT - End of Session Activity Tolerance: Patient limited by pain;Other (comment) (but overall did well with tasks.) Patient left: Other (comment) (EOb with nursing)  GO     [name] [phone] 11/30/2011, 11:48 AM

## 2011-11-30 NOTE — Progress Notes (Signed)
PATIENT ID: Jeremiah Wright   1 Day Post-Op Procedure(s) (LRB): REVERSE SHOULDER ARTHROPLASTY (Left)  Subjective: Feeling very well, pain is minimal at this point. Slept well overnight. No complains or concerns. Feels ready to go home.   Objective:  Filed Vitals:   11/30/11 0602  BP: 115/73  Pulse: 80  Temp: 98.4 F (36.9 C)  Resp: 16     A&O L UE dressing c/d/i, drain removed Wiggles fingers, intact sensation to light touch, r/m/u nerves intact Adequately fires deltoid   Labs:   Basename 11/30/11 0345  HGB 9.8*   Basename 11/30/11 0345  WBC 7.5  RBC 3.25*  HCT 29.2*  PLT 215   Basename 11/30/11 0345  NA 136  K 3.7  CL 104  CO2 25  BUN 12  CREATININE 1.35  GLUCOSE 118*  CALCIUM 7.9*    Assessment and Plan: Ready to go home after OT today D/c with percocet for pain control Fu with Dr. Ave Filter in 2 weeks  VTE proph: SCDs, ASA 325 BID

## 2012-03-26 ENCOUNTER — Other Ambulatory Visit: Payer: Self-pay | Admitting: Urology

## 2012-04-11 ENCOUNTER — Encounter (HOSPITAL_BASED_OUTPATIENT_CLINIC_OR_DEPARTMENT_OTHER): Payer: Self-pay | Admitting: *Deleted

## 2012-04-11 NOTE — Progress Notes (Signed)
NPO AFTER MN. ARRIVES AT 0745. NEEDS ISTAT AND EKG. WILL TAKE PRILOSEC AM OF SURG W/ SIP OF WATER.

## 2012-04-16 NOTE — H&P (Signed)
Chief Complaint   cc:  Jeremiah Grippe, MD   Reason For Visit  Cystoscopy   Active Problems Problems  1. 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  Originally referred by Dr. Selena Batten for further evaluation of recurrent UTI's. He has hx micro- hematuria 10-15 yrs ago, thought 2ndary RMSF. He has now had recurrent UTI recently, and has had antibiotic prior to recent Left shoulder surgery. He has a hx of cigar smoking, but none x 10 yrs. No dysuria, no fever, or chills.      He used to drink 4-16 oz diet Coke/day, but now drinks < 1/day. He has increased his water intake. His weight has decreased from 245lbs to 200 lbs (6 weeks).   10/31/11  PSA - 0.4   Past Medical History Problems  1. History of  Anxiety (Symptom) 300.00 2. History of  Arthritis V13.4 3. History of  Diverticulosis 562.10 4. Former Smoker V15.82 5. History of  Hypertension 401.9 6. History of  Insomnia 780.52 7. History of  [place] Spotted Fever 082.0  Surgical History Problems  1. History of  Shoulder Surgery  Current Meds 1. Ambien TABS; Therapy: (Recorded:02Jan2014) to 2. CefTRIAXone Sodium 1 GM Injection Solution Reconstituted; INJECT 1  GM Intramuscular; To Be  Done: 15Jan2014; Status: HOLD FOR - Administration 3. CefTRIAXone Sodium 1 GM Injection Solution Reconstituted; INJECT 1  GM Intramuscular; To Be  Done: 16Jan2014; Status: HOLD FOR - Administration 4. CefTRIAXone Sodium 1 GM Injection Solution Reconstituted; INJECT 1  GM Intramuscular; To Be  Done: 17Jan2014; Status: HOLD FOR - Administration 5. Diovan TABS; Therapy: (Recorded:02Jan2014) to 6. Motrin TABS; Therapy: (Recorded:03Jan2014) to  Allergies Medication  1. No Known Drug Allergies  Family History Problems  1. Family history of  Family Health Status Number Of Children 1 son, 1 daughters 2. Family history of  Father  Deceased At Age 45 3. Family history of  Mother Deceased At Age 15  Social History Problems  1. Caffeine Use 2/day 2. Marital History - Currently Married 3. Never A Smoker Denied  4. History of  Alcohol Use  Review of Systems  Genitourinary: urinary frequency, feelings of urinary urgency, nocturia and weak urinary stream, but no dysuria, no incontinence, no difficulty starting the urinary stream, urinary stream does not start and stop, no incomplete emptying of bladder, no post-void dribbling and initiating urination does not require straining.    Vitals Vital Signs [Data Includes: Last 1 Day]  11Feb2014 10:02AM  BMI Calculated: 33.32 BSA Calculated: 1.98 Height: 5 ft 5 in Weight: 200 lb  Blood Pressure: 206 / 102 Temperature: 98.4 F Heart Rate: 105  Physical Exam Genitourinary: Examination of the penis demonstrates no discharge, no masses, no lesions and a normal meatus. The penis is circumcised. The scrotum is without lesions. The right epididymis is palpably normal and non-tender. The left epididymis is palpably normal and non-tender. The right testis is non-tender and without masses. The left testis is non-tender and without masses.    Procedure  Procedure: Cystoscopy  Chaperone Present: kim.  Indication: Frequent UTI.  Informed Consent: Risks, benefits, and potential adverse events were discussed and informed consent was obtained from the patient.  Prep: The patient was prepped with betadine.  Anesthesia:. Local anesthesia was administered intraurethrally with 2% lidocaine jelly.  Antibiotic prophylaxis: Ciprofloxacin.  Procedure Note:  Urethral meatus:. No abnormalities.  Anterior urethra: No abnormalities.  Prostatic urethra: No abnormalities.  Bladder: Visulization was clear. The ureteral orifices were in the normal anatomic position bilaterally. A systematic survey of the bladder demonstrated no bladder tumors or stones. Examination of the bladder demonstrated  trabeculation and a diverticulum located on the right side, near the trigone of the bladder measuring approximately 1 cm cellules, but no edema. Multiple tumors were identified in the bladder. A tumor was seen in the bladder measuring approximately 4 cm in size. This tumor was located near the trigone of the bladder. tumors within diverticulum, R trigone. No R orifice seen.    Assessment Assessed  1. Bladder Cancer 188.9   R bladder tic wiht tumors. No orifice seen   Plan Chronic Cystitis (595.2)  1. FISH(UroVysion)  Done: 11Feb2014 Neoplasm Of The Bladder (239.4)  2. CREATININE with eGFR  Done: 11Feb2014 3. CREATININE with eGFR  Done: 11Feb2014 4. VENIPUNCTURE  Done: 11Feb2014   CT, cysto and bx, laser tumors.   Signatures Electronically signed by : Jethro Bolus, M.D.; Mar 25 2012  3:05PM

## 2012-04-17 ENCOUNTER — Ambulatory Visit (HOSPITAL_BASED_OUTPATIENT_CLINIC_OR_DEPARTMENT_OTHER): Payer: Medicare Other | Admitting: Anesthesiology

## 2012-04-17 ENCOUNTER — Encounter (HOSPITAL_BASED_OUTPATIENT_CLINIC_OR_DEPARTMENT_OTHER): Payer: Self-pay | Admitting: *Deleted

## 2012-04-17 ENCOUNTER — Encounter (HOSPITAL_BASED_OUTPATIENT_CLINIC_OR_DEPARTMENT_OTHER): Payer: Self-pay | Admitting: Anesthesiology

## 2012-04-17 ENCOUNTER — Ambulatory Visit (HOSPITAL_BASED_OUTPATIENT_CLINIC_OR_DEPARTMENT_OTHER)
Admission: RE | Admit: 2012-04-17 | Discharge: 2012-04-17 | Disposition: A | Discharge status: Home / Self Care | Payer: Medicare Other | Source: Ambulatory Visit | Attending: Urology | Admitting: Urology

## 2012-04-17 ENCOUNTER — Encounter (HOSPITAL_BASED_OUTPATIENT_CLINIC_OR_DEPARTMENT_OTHER)
Admission: RE | Disposition: A | Discharge status: Home / Self Care | Payer: Self-pay | Source: Ambulatory Visit | Attending: Urology

## 2012-04-17 DIAGNOSIS — C679 Malignant neoplasm of bladder, unspecified: Secondary | ICD-10-CM | POA: Insufficient documentation

## 2012-04-17 DIAGNOSIS — N323 Diverticulum of bladder: Secondary | ICD-10-CM | POA: Insufficient documentation

## 2012-04-17 DIAGNOSIS — I10 Essential (primary) hypertension: Secondary | ICD-10-CM | POA: Insufficient documentation

## 2012-04-17 DIAGNOSIS — N3020 Other chronic cystitis without hematuria: Secondary | ICD-10-CM | POA: Insufficient documentation

## 2012-04-17 DIAGNOSIS — Z79899 Other long term (current) drug therapy: Secondary | ICD-10-CM | POA: Insufficient documentation

## 2012-04-17 DIAGNOSIS — C670 Malignant neoplasm of trigone of bladder: Secondary | ICD-10-CM

## 2012-04-17 HISTORY — DX: Personal history of other diseases of the digestive system: Z87.19

## 2012-04-17 HISTORY — PX: CYSTOSCOPY W/ RETROGRADES: SHX1426

## 2012-04-17 HISTORY — PX: CYSTOSCOPY WITH BIOPSY: SHX5122

## 2012-04-17 HISTORY — DX: Insomnia, unspecified: G47.00

## 2012-04-17 LAB — POCT I-STAT 4, (NA,K, GLUC, HGB,HCT)
Glucose, Bld: 106 mg/dL — ABNORMAL HIGH (ref 70–99)
HCT: 37 % — ABNORMAL LOW (ref 39.0–52.0)
Potassium: 3.5 mEq/L (ref 3.5–5.1)
Sodium: 143 mEq/L (ref 135–145)

## 2012-04-17 SURGERY — CYSTOSCOPY, WITH BIOPSY
Anesthesia: General | Site: Ureter | Laterality: Right

## 2012-04-17 MED ORDER — STERILE WATER FOR IRRIGATION IR SOLN
Status: DC | PRN
  Administered 2012-04-17: 500 mL

## 2012-04-17 MED ORDER — DEXAMETHASONE SODIUM PHOSPHATE 4 MG/ML IJ SOLN
INTRAMUSCULAR | Status: DC | PRN
  Administered 2012-04-17: 8 mg via INTRAVENOUS

## 2012-04-17 MED ORDER — HYDROCODONE-IBUPROFEN 5-200 MG PO TABS: 1.0000 | ORAL_TABLET | Freq: Four times a day (QID) | ORAL | Status: DC | PRN

## 2012-04-17 MED ORDER — LACTATED RINGERS IV SOLN
INTRAVENOUS | Status: DC
  Filled 2012-04-17: qty 1000

## 2012-04-17 MED ORDER — MEPERIDINE HCL 25 MG/ML IJ SOLN
6.2500 mg | INTRAMUSCULAR | Status: DC | PRN
  Filled 2012-04-17: qty 1

## 2012-04-17 MED ORDER — PHENAZOPYRIDINE HCL 200 MG PO TABS: 200.0000 mg | ORAL_TABLET | Freq: Three times a day (TID) | ORAL | Status: DC | PRN

## 2012-04-17 MED ORDER — CEFAZOLIN SODIUM-DEXTROSE 2-3 GM-% IV SOLR
2.0000 g | INTRAVENOUS | Status: AC
  Administered 2012-04-17: 2 g via INTRAVENOUS
  Filled 2012-04-17: qty 50

## 2012-04-17 MED ORDER — TAMSULOSIN HCL 0.4 MG PO CAPS: 0.4000 mg | ORAL_CAPSULE | Freq: Every day | ORAL | Status: DC

## 2012-04-17 MED ORDER — 0.9 % SODIUM CHLORIDE (POUR BTL) OPTIME
TOPICAL | Status: DC | PRN
  Administered 2012-04-17: 500 mL

## 2012-04-17 MED ORDER — ONDANSETRON HCL 4 MG/2ML IJ SOLN
INTRAMUSCULAR | Status: DC | PRN
  Administered 2012-04-17: 4 mg via INTRAVENOUS

## 2012-04-17 MED ORDER — BELLADONNA ALKALOIDS-OPIUM 16.2-60 MG RE SUPP
RECTAL | Status: DC | PRN
  Administered 2012-04-17: 1 via RECTAL

## 2012-04-17 MED ORDER — PROMETHAZINE HCL 25 MG/ML IJ SOLN
6.2500 mg | INTRAMUSCULAR | Status: DC | PRN
  Filled 2012-04-17: qty 1

## 2012-04-17 MED ORDER — INDIGOTINDISULFONATE SODIUM 8 MG/ML IJ SOLN
INTRAMUSCULAR | Status: DC | PRN
  Administered 2012-04-17: 5 mL via INTRAVENOUS

## 2012-04-17 MED ORDER — FENTANYL CITRATE 0.05 MG/ML IJ SOLN
25.0000 ug | INTRAMUSCULAR | Status: DC | PRN
  Filled 2012-04-17: qty 1

## 2012-04-17 MED ORDER — TRIMETHOPRIM 100 MG PO TABS: 100.0000 mg | ORAL_TABLET | ORAL | Status: DC

## 2012-04-17 MED ORDER — LIDOCAINE HCL (CARDIAC) 20 MG/ML IV SOLN
INTRAVENOUS | Status: DC | PRN
  Administered 2012-04-17: 50 mg via INTRAVENOUS

## 2012-04-17 MED ORDER — ACETAMINOPHEN 10 MG/ML IV SOLN
INTRAVENOUS | Status: DC | PRN
  Administered 2012-04-17: 1000 mg via INTRAVENOUS

## 2012-04-17 MED ORDER — KETOROLAC TROMETHAMINE 30 MG/ML IJ SOLN
INTRAMUSCULAR | Status: DC | PRN
  Administered 2012-04-17: 30 mg via INTRAVENOUS

## 2012-04-17 MED ORDER — PROPOFOL 10 MG/ML IV BOLUS
INTRAVENOUS | Status: DC | PRN
  Administered 2012-04-17: 120 mg via INTRAVENOUS

## 2012-04-17 MED ORDER — LACTATED RINGERS IV SOLN
INTRAVENOUS | Status: DC
  Administered 2012-04-17: 08:00:00 via INTRAVENOUS
  Filled 2012-04-17: qty 1000

## 2012-04-17 MED ORDER — SODIUM CHLORIDE 0.9 % IR SOLN
Status: DC | PRN
  Administered 2012-04-17: 9000 mL via INTRAVESICAL

## 2012-04-17 MED ORDER — FENTANYL CITRATE 0.05 MG/ML IJ SOLN
INTRAMUSCULAR | Status: DC | PRN
  Administered 2012-04-17: 75 ug via INTRAVENOUS
  Administered 2012-04-17 (×2): 25 ug via INTRAVENOUS

## 2012-04-17 SURGICAL SUPPLY — 26 items
BAG DRAIN URO-CYSTO SKYTR STRL (DRAIN) ×6 IMPLANT
BAG URINE LEG 500ML (DRAIN) ×3 IMPLANT
BOOTIES KNEE HIGH SLOAN (MISCELLANEOUS) ×3 IMPLANT
CANISTER SUCT LVC 12 LTR MEDI- (MISCELLANEOUS) ×6 IMPLANT
CATH FOLEY 2WAY SLVR  5CC 18FR (CATHETERS) ×1
CATH FOLEY 2WAY SLVR 5CC 18FR (CATHETERS) ×2 IMPLANT
CLOTH BEACON ORANGE TIMEOUT ST (SAFETY) ×3 IMPLANT
DRAPE CAMERA CLOSED 9X96 (DRAPES) ×3 IMPLANT
ELECT LOOP MED HF 24F 12D CBL (CLIP) ×3 IMPLANT
ELECT REM PT RETURN 9FT ADLT (ELECTROSURGICAL)
ELECT RESECT VAPORIZE 12D CBL (ELECTRODE) ×3 IMPLANT
ELECTRODE REM PT RTRN 9FT ADLT (ELECTROSURGICAL) IMPLANT
GLOVE BIO SURGEON STRL SZ7.5 (GLOVE) ×3 IMPLANT
GOWN PREVENTION PLUS LG XLONG (DISPOSABLE) ×3 IMPLANT
GOWN STRL REIN XL XLG (GOWN DISPOSABLE) ×3 IMPLANT
KIT ASPIRATION TUBING (SET/KITS/TRAYS/PACK) ×3 IMPLANT
NDL SAFETY ECLIPSE 18X1.5 (NEEDLE) IMPLANT
NEEDLE ELECTRODE (NEEDLE) ×3 IMPLANT
NEEDLE HYPO 18GX1.5 SHARP (NEEDLE)
NEEDLE HYPO 22GX1.5 SAFETY (NEEDLE) IMPLANT
NEEDLE SPNL 22GX7 QUINCKE BK (NEEDLE) IMPLANT
NS IRRIG 500ML POUR BTL (IV SOLUTION) IMPLANT
PACK CYSTOSCOPY (CUSTOM PROCEDURE TRAY) ×3 IMPLANT
SYR 20CC LL (SYRINGE) IMPLANT
SYR 5ML LL (SYRINGE) ×3 IMPLANT
WATER STERILE IRR 3000ML UROMA (IV SOLUTION) ×9 IMPLANT

## 2012-04-17 NOTE — Anesthesia Preprocedure Evaluation (Signed)
Anesthesia Evaluation  Patient identified by MRN, date of birth, ID band Patient awake    Reviewed: Allergy & Precautions, H&P , NPO status , Patient's Chart, lab work & pertinent test results  Airway Mallampati: II TM Distance: >3 FB Neck ROM: Full    Dental no notable dental hx. (+) Missing   Pulmonary neg pulmonary ROS,  breath sounds clear to auscultation  Pulmonary exam normal       Cardiovascular hypertension, Pt. on medications negative cardio ROS  Rhythm:Regular Rate:Normal     Neuro/Psych negative neurological ROS  negative psych ROS   GI/Hepatic negative GI ROS, Neg liver ROS,   Endo/Other  negative endocrine ROS  Renal/GU negative Renal ROS  negative genitourinary   Musculoskeletal negative musculoskeletal ROS (+)   Abdominal   Peds negative pediatric ROS (+)  Hematology negative hematology ROS (+)   Anesthesia Other Findings   Reproductive/Obstetrics negative OB ROS                           Anesthesia Physical  Anesthesia Plan  ASA: II  Anesthesia Plan: General   Post-op Pain Management:    Induction: Intravenous  Airway Management Planned: Oral ETT and LMA  Additional Equipment:   Intra-op Plan:   Post-operative Plan: Extubation in OR  Informed Consent: I have reviewed the patients History and Physical, chart, labs and discussed the procedure including the risks, benefits and alternatives for the proposed anesthesia with the patient or authorized representative who has indicated his/her understanding and acceptance.   Dental advisory given  Plan Discussed with: CRNA  Anesthesia Plan Comments:         Anesthesia Quick Evaluation

## 2012-04-17 NOTE — Interval H&P Note (Signed)
History and Physical Interval Note:  04/17/2012 9:01 AM  Jeremiah Wright  has presented today for surgery, with the diagnosis of bladder tumor/right bladder base diverticulum  The various methods of treatment have been discussed with the patient and family. After consideration of risks, benefits and other options for treatment, the patient has consented to  Procedure(s) with comments: CYSTOSCOPY WITH COLD CUP BIOPSY OF RIGHT BLADDER DIVERTICULUM TUMOR/HOLMIUM LASER OF BLADDER TUMOR/POSSIBLE BUTTON VAPORIZATION OF TUMOR INSIDE DIVERTICULUM (Right) - CYSTOSCOPY WITH COLD CUP BIOPSY OF RIGHT BLADDER DIVERTICULUM TUMOR/HOLMIUM LASER OF BLADDER TUMOR/POSSIBLE BUTTON VAPORIZATION OF TUMOR INSIDE DIVERTICULUM HOLMIUM LASER APPLICATION (Right) as a surgical intervention .  The patient's history has been reviewed, patient examined, no change in status, stable for surgery.  I have reviewed the patient's chart and labs.  Questions were answered to the patient's satisfaction.     Jethro Bolus I

## 2012-04-17 NOTE — Anesthesia Procedure Notes (Signed)
Procedure Name: LMA Insertion Date/Time: 04/17/2012 9:09 AM Performed by: Renella Cunas D Pre-anesthesia Checklist: Patient identified, Emergency Drugs available, Suction available and Patient being monitored Patient Re-evaluated:Patient Re-evaluated prior to inductionOxygen Delivery Method: Circle System Utilized Preoxygenation: Pre-oxygenation with 100% oxygen Intubation Type: IV induction Ventilation: Mask ventilation without difficulty LMA: LMA inserted LMA Size: 4.0 Number of attempts: 1 Airway Equipment and Method: bite block Placement Confirmation: positive ETCO2 Tube secured with: Tape Dental Injury: Teeth and Oropharynx as per pre-operative assessment

## 2012-04-17 NOTE — Anesthesia Postprocedure Evaluation (Signed)
  Anesthesia Post-op Note  Patient: Jeremiah Wright  Procedure(s) Performed: Procedure(s) (LRB): CYSTOSCOPY WITH  BIOPSY OF RIGHT BLADDER DIVERTICULUM TUMOR BUTTON VAPORIZATION OF TUMOR INSIDE DIVERTICULUM (Right) CYSTOSCOPY WITH RETROGRADE PYELOGRAM (Right)  Patient Location: PACU  Anesthesia Type: General  Level of Consciousness: awake and alert   Airway and Oxygen Therapy: Patient Spontanous Breathing  Post-op Pain: mild  Post-op Assessment: Post-op Vital signs reviewed, Patient's Cardiovascular Status Stable, Respiratory Function Stable, Patent Airway and No signs of Nausea or vomiting  Last Vitals:  Filed Vitals:   04/17/12 1004  BP: 125/67  Pulse:   Temp: 36.7 C  Resp:     Post-op Vital Signs: stable   Complications: No apparent anesthesia complications

## 2012-04-17 NOTE — Op Note (Signed)
Pre-operative diagnosis :   Right intra-diverticular bladder tumor  Postoperative diagnosis:   Same  Operation:   Cystourethroscopy, transurethral resection of large 4 cm right intra-diverticular bladder tumor with resection of 1 cm satellite right diverticular bladder tumor, right retrograde pyelogram with interpretation.  Surgeon:  Kathie Rhodes. Patsi Sears, MD  First assistant:   None  Anesthesia:  general  Preparation:   After appropriate preanesthesia, the patient was brought to the operative room, placed on the operating table in the dorsal supine position where general LMA anesthesia was introduced. He was then replaced in the dorsal lithotomy position where the pubis was prepped with Betadine solution and draped in usual fashion. The arm band was double checked. The right arm was marked.  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 Originally referred by Dr. Selena Batten for further evaluation of recurrent UTI's. He has hx micro- hematuria 10-15 yrs ago, thought 2ndary RMSF. He has now had recurrent UTI recently, and has had antibiotic prior to recent Left shoulder surgery. He has a hx of cigar smoking, but none x 10 yrs. No dysuria, no fever, or chills.  He used to drink 4-16 oz diet Coke/day, but now drinks < 1/day. He has increased his water intake. His weight has decreased from 245lbs to 200 lbs (6 weeks).  10/31/11 PSA - 0.4 Cysto showed bilateral trigonal tics, and Left orifice, but no Right orifice. Bladder tumor found inside large Right tic.    Statement of  Likelihood of Success: Excellent. TIME-OUT observed.:  Procedure:   Cystourethroscopy was accomplished, and showed elevated bladder neck, with trabeculation and cellule formation within the bladder. The trigone was identified, and there were diverticula identified, on the trigonal ridge, on the lateral borders of the trigone, and the area of 8 French diverticulum.  Indigocarmine was given, and a jets of blue dye was seen from the left ureteral orifice, which was in normal position on the left hemitrigone. The right ureteral orifice was identified within the right diverticular orifice rim. Right retrograde pyelogram showed no evidence of caliectasis. The renal pelvis was normal. No ureteral tumor was identified, although there was some dilation of both the upper ureter and the lower third ureter. All portions of the ureter appeared to drain well under fluoroscopic evaluation.  A large 4 cm transitional cell tumor was identified on the right side of the diverticulum. This was resected with the gyrus instrumentation. Following this, the button electrode was used to coagulate the bleeding base. A second 1 cm satellite tumor was identified and this was also resected, and the base was also cauterized with the button electrode. All tissue was sent to laboratory for evaluation. Because the diverticulum has no muscle within the wall, I elected to not resect deeply into the base of the tumor. Rather, I elected to resect as much as possible, and cauterize the base of the tumor.  After tissue was evacuated from the bladder, a size 18 French catheter was placed with 10 cc in the balloon to straight drainage. The patient received IV Tylenol and a B. and O. suppository at the beginning of procedure, and IV Toradol at the end of the procedure. He was awakened, and taken to recovery room in good condition.

## 2012-04-17 NOTE — Transfer of Care (Signed)
Immediate Anesthesia Transfer of Care Note  Patient: Jeremiah Wright  Procedure(s) Performed: Procedure(s) (LRB): CYSTOSCOPY WITH COLD CUP BIOPSY OF RIGHT BLADDER DIVERTICULUM TUMOR/HOLMIUM LASER OF BLADDER TUMOR/POSSIBLE BUTTON VAPORIZATION OF TUMOR INSIDE DIVERTICULUM (Right) HOLMIUM LASER APPLICATION (Right)  Patient Location: PACU  Anesthesia Type: General  Level of Consciousness: awake, oriented, sedated and patient cooperative  Airway & Oxygen Therapy: Patient Spontanous Breathing and Patient connected to face mask oxygen  Post-op Assessment: Report given to PACU RN and Post -op Vital signs reviewed and stable  Post vital signs: Reviewed and stable  Complications: No apparent anesthesia complications

## 2012-04-18 ENCOUNTER — Encounter (HOSPITAL_BASED_OUTPATIENT_CLINIC_OR_DEPARTMENT_OTHER): Payer: Self-pay | Admitting: Urology

## 2012-10-22 ENCOUNTER — Other Ambulatory Visit: Payer: Self-pay | Admitting: Urology

## 2012-10-30 ENCOUNTER — Encounter (HOSPITAL_BASED_OUTPATIENT_CLINIC_OR_DEPARTMENT_OTHER): Payer: Self-pay | Admitting: *Deleted

## 2012-10-30 NOTE — Progress Notes (Signed)
10/30/12 1322  OBSTRUCTIVE SLEEP APNEA  Have you ever been diagnosed with sleep apnea through a sleep study? No  Do you snore loudly (loud enough to be heard through closed doors)?  1  Do you often feel tired, fatigued, or sleepy during the daytime? 0  Has anyone observed you stop breathing during your sleep? 0  Do you have, or are you being treated for high blood pressure? 1  BMI more than 35 kg/m2? 0  Age over 71 years old? 1  Neck circumference greater than 40 cm/18 inches? 0  Gender: 1  Obstructive Sleep Apnea Score 4  Score 4 or greater  Results sent to PCP

## 2012-10-30 NOTE — Progress Notes (Signed)
NPO AFTER MN. ARRIVE AT 1610. NEEDS ISTAT. CURRENT EKG IN EPIC AND CHART. WILL TAKE PRILOSEC AM OF SURG W/ SIP OF WATER.

## 2012-11-07 ENCOUNTER — Ambulatory Visit (HOSPITAL_BASED_OUTPATIENT_CLINIC_OR_DEPARTMENT_OTHER)
Admission: RE | Admit: 2012-11-07 | Discharge: 2012-11-07 | Disposition: A | Discharge status: Home / Self Care | Payer: Medicare Other | Source: Ambulatory Visit | Attending: Urology | Admitting: Urology

## 2012-11-07 ENCOUNTER — Ambulatory Visit (HOSPITAL_BASED_OUTPATIENT_CLINIC_OR_DEPARTMENT_OTHER): Payer: Medicare Other | Admitting: Anesthesiology

## 2012-11-07 ENCOUNTER — Encounter (HOSPITAL_BASED_OUTPATIENT_CLINIC_OR_DEPARTMENT_OTHER): Payer: Self-pay

## 2012-11-07 ENCOUNTER — Encounter (HOSPITAL_BASED_OUTPATIENT_CLINIC_OR_DEPARTMENT_OTHER): Payer: Self-pay | Admitting: Anesthesiology

## 2012-11-07 ENCOUNTER — Encounter (HOSPITAL_BASED_OUTPATIENT_CLINIC_OR_DEPARTMENT_OTHER)
Admission: RE | Disposition: A | Discharge status: Home / Self Care | Payer: Self-pay | Source: Ambulatory Visit | Attending: Urology

## 2012-11-07 DIAGNOSIS — Z87898 Personal history of other specified conditions: Secondary | ICD-10-CM | POA: Insufficient documentation

## 2012-11-07 DIAGNOSIS — Z87891 Personal history of nicotine dependence: Secondary | ICD-10-CM | POA: Insufficient documentation

## 2012-11-07 DIAGNOSIS — Z79899 Other long term (current) drug therapy: Secondary | ICD-10-CM | POA: Insufficient documentation

## 2012-11-07 DIAGNOSIS — N401 Enlarged prostate with lower urinary tract symptoms: Secondary | ICD-10-CM | POA: Insufficient documentation

## 2012-11-07 DIAGNOSIS — N3020 Other chronic cystitis without hematuria: Secondary | ICD-10-CM | POA: Insufficient documentation

## 2012-11-07 DIAGNOSIS — I10 Essential (primary) hypertension: Secondary | ICD-10-CM | POA: Insufficient documentation

## 2012-11-07 DIAGNOSIS — C679 Malignant neoplasm of bladder, unspecified: Secondary | ICD-10-CM | POA: Insufficient documentation

## 2012-11-07 DIAGNOSIS — Z87440 Personal history of urinary (tract) infections: Secondary | ICD-10-CM | POA: Insufficient documentation

## 2012-11-07 DIAGNOSIS — N139 Obstructive and reflux uropathy, unspecified: Secondary | ICD-10-CM | POA: Insufficient documentation

## 2012-11-07 DIAGNOSIS — D494 Neoplasm of unspecified behavior of bladder: Secondary | ICD-10-CM | POA: Insufficient documentation

## 2012-11-07 DIAGNOSIS — N323 Diverticulum of bladder: Secondary | ICD-10-CM | POA: Insufficient documentation

## 2012-11-07 HISTORY — PX: TRANSURETHRAL RESECTION OF BLADDER TUMOR WITH MITOMYCIN-C: SHX6459

## 2012-11-07 HISTORY — PX: CYSTOSCOPY WITH BIOPSY: SHX5122

## 2012-11-07 HISTORY — DX: Malignant neoplasm of bladder, unspecified: C67.9

## 2012-11-07 LAB — POCT I-STAT 4, (NA,K, GLUC, HGB,HCT)
Glucose, Bld: 102 mg/dL — ABNORMAL HIGH (ref 70–99)
HCT: 38 % — ABNORMAL LOW (ref 39.0–52.0)
Hemoglobin: 12.9 g/dL — ABNORMAL LOW (ref 13.0–17.0)
Sodium: 142 mEq/L (ref 135–145)

## 2012-11-07 SURGERY — TRANSURETHRAL RESECTION OF BLADDER TUMOR WITH MITOMYCIN-C
Anesthesia: General | Site: Bladder | Wound class: Clean Contaminated

## 2012-11-07 MED ORDER — OXYCODONE-ACETAMINOPHEN 5-325 MG PO TABS: 1.0000 | ORAL_TABLET | ORAL | Status: DC | PRN

## 2012-11-07 MED ORDER — BELLADONNA ALKALOIDS-OPIUM 16.2-60 MG RE SUPP
RECTAL | Status: DC | PRN
  Administered 2012-11-07: 1 via RECTAL

## 2012-11-07 MED ORDER — URIBEL 118 MG PO CAPS: 1.0000 | ORAL_CAPSULE | Freq: Two times a day (BID) | ORAL | Status: DC | PRN

## 2012-11-07 MED ORDER — PROMETHAZINE HCL 25 MG/ML IJ SOLN
6.2500 mg | INTRAMUSCULAR | Status: DC | PRN
  Filled 2012-11-07: qty 1

## 2012-11-07 MED ORDER — STERILE WATER FOR IRRIGATION IR SOLN
Status: DC | PRN
  Administered 2012-11-07: 500 mL

## 2012-11-07 MED ORDER — LACTATED RINGERS IV SOLN
INTRAVENOUS | Status: DC | PRN
  Administered 2012-11-07 (×2): via INTRAVENOUS

## 2012-11-07 MED ORDER — CEFAZOLIN SODIUM-DEXTROSE 2-3 GM-% IV SOLR
INTRAVENOUS | Status: DC | PRN
  Administered 2012-11-07: 2 g via INTRAVENOUS

## 2012-11-07 MED ORDER — MITOMYCIN CHEMO FOR BLADDER INSTILLATION 40 MG
40.0000 mg | Freq: Once | INTRAVENOUS | Status: AC
  Administered 2012-11-07: 40 mg via INTRAVESICAL
  Filled 2012-11-07: qty 40

## 2012-11-07 MED ORDER — FENTANYL CITRATE 0.05 MG/ML IJ SOLN
INTRAMUSCULAR | Status: DC | PRN
  Administered 2012-11-07: 50 ug via INTRAVENOUS
  Administered 2012-11-07 (×4): 12.5 ug via INTRAVENOUS

## 2012-11-07 MED ORDER — PROPOFOL 10 MG/ML IV BOLUS
INTRAVENOUS | Status: DC | PRN
  Administered 2012-11-07: 180 mg via INTRAVENOUS

## 2012-11-07 MED ORDER — LIDOCAINE HCL (CARDIAC) 20 MG/ML IV SOLN
INTRAVENOUS | Status: DC | PRN
  Administered 2012-11-07: 60 mg via INTRAVENOUS

## 2012-11-07 MED ORDER — LACTATED RINGERS IV SOLN
INTRAVENOUS | Status: DC
  Administered 2012-11-07: 09:00:00 via INTRAVENOUS
  Filled 2012-11-07: qty 1000

## 2012-11-07 MED ORDER — MEPERIDINE HCL 25 MG/ML IJ SOLN
6.2500 mg | INTRAMUSCULAR | Status: DC | PRN
  Filled 2012-11-07: qty 1

## 2012-11-07 MED ORDER — KETOROLAC TROMETHAMINE 30 MG/ML IJ SOLN
INTRAMUSCULAR | Status: DC | PRN
  Administered 2012-11-07: 30 mg via INTRAVENOUS

## 2012-11-07 MED ORDER — ONDANSETRON HCL 4 MG/2ML IJ SOLN
INTRAMUSCULAR | Status: DC | PRN
  Administered 2012-11-07: 4 mg via INTRAVENOUS

## 2012-11-07 MED ORDER — DEXAMETHASONE SODIUM PHOSPHATE 4 MG/ML IJ SOLN
INTRAMUSCULAR | Status: DC | PRN
  Administered 2012-11-07: 8 mg via INTRAVENOUS

## 2012-11-07 MED ORDER — FENTANYL CITRATE 0.05 MG/ML IJ SOLN
25.0000 ug | INTRAMUSCULAR | Status: DC | PRN
  Filled 2012-11-07: qty 1

## 2012-11-07 MED ORDER — SODIUM CHLORIDE 0.9 % IR SOLN
Status: DC | PRN
  Administered 2012-11-07: 9000 mL via INTRAVESICAL

## 2012-11-07 MED ORDER — CEFAZOLIN SODIUM-DEXTROSE 2-3 GM-% IV SOLR
2.0000 g | INTRAVENOUS | Status: DC
  Filled 2012-11-07: qty 50

## 2012-11-07 MED ORDER — TRIMETHOPRIM 100 MG PO TABS: 100.0000 mg | ORAL_TABLET | ORAL | Status: DC

## 2012-11-07 SURGICAL SUPPLY — 26 items
BAG DRAIN URO-CYSTO SKYTR STRL (DRAIN) ×2 IMPLANT
BOOTIES KNEE HIGH SLOAN (MISCELLANEOUS) ×2 IMPLANT
CANISTER SUCT LVC 12 LTR MEDI- (MISCELLANEOUS) ×4 IMPLANT
CATH FOLEY 2WAY SLVR 30CC 22FR (CATHETERS) ×2 IMPLANT
CLOTH BEACON ORANGE TIMEOUT ST (SAFETY) ×2 IMPLANT
DRAPE CAMERA CLOSED 9X96 (DRAPES) ×2 IMPLANT
ELECT LOOP MED HF 24F 12D CBL (CLIP) ×2 IMPLANT
ELECT REM PT RETURN 9FT ADLT (ELECTROSURGICAL)
ELECTRODE REM PT RTRN 9FT ADLT (ELECTROSURGICAL) IMPLANT
GLOVE BIO SURGEON STRL SZ 6.5 (GLOVE) ×2 IMPLANT
GLOVE BIO SURGEON STRL SZ7.5 (GLOVE) ×2 IMPLANT
GLOVE INDICATOR 6.5 STRL GRN (GLOVE) ×2 IMPLANT
GOWN PREVENTION PLUS LG XLONG (DISPOSABLE) ×4 IMPLANT
GOWN STRL NON-REIN LRG LVL3 (GOWN DISPOSABLE) ×2 IMPLANT
GOWN STRL REIN XL XLG (GOWN DISPOSABLE) ×2 IMPLANT
IV NS IRRIG 3000ML ARTHROMATIC (IV SOLUTION) ×6 IMPLANT
KIT ASPIRATION TUBING (SET/KITS/TRAYS/PACK) ×2 IMPLANT
NDL SAFETY ECLIPSE 18X1.5 (NEEDLE) IMPLANT
NEEDLE HYPO 18GX1.5 SHARP (NEEDLE)
NEEDLE HYPO 22GX1.5 SAFETY (NEEDLE) IMPLANT
NEEDLE SPNL 22GX7 QUINCKE BK (NEEDLE) IMPLANT
NS IRRIG 500ML POUR BTL (IV SOLUTION) IMPLANT
PACK CYSTOSCOPY (CUSTOM PROCEDURE TRAY) ×2 IMPLANT
PLUG CATH AND CAP STER (CATHETERS) ×2 IMPLANT
SYR 20CC LL (SYRINGE) IMPLANT
WATER STERILE IRR 3000ML UROMA (IV SOLUTION) ×2 IMPLANT

## 2012-11-07 NOTE — Interval H&P Note (Signed)
History and Physical Interval Note:  11/07/2012 9:14 AM  Jeremiah Wright  has presented today for surgery, with the diagnosis of BLADDER CA  The various methods of treatment have been discussed with the patient and family. After consideration of risks, benefits and other options for treatment, the patient has consented to  Procedure(s): TRANSURETHRAL RESECTION OF BLADDER TUMOR WITH MITOMYCIN-C (N/A) CYSTOSCOPY WITH COLD CUP BIOPSY AND FULGERATION (N/A) as a surgical intervention .  The patient's history has been reviewed, patient examined, no change in status, stable for surgery.  I have reviewed the patient's chart and labs.  Questions were answered to the patient's satisfaction.     Jethro Bolus I

## 2012-11-07 NOTE — Anesthesia Preprocedure Evaluation (Addendum)
Anesthesia Evaluation  Patient identified by MRN, date of birth, ID band Patient awake    Reviewed: Allergy & Precautions, H&P , NPO status , Patient's Chart, lab work & pertinent test results  Airway Mallampati: II TM Distance: >3 FB Neck ROM: Full    Dental no notable dental hx. (+) Missing   Pulmonary neg pulmonary ROS,  breath sounds clear to auscultation  Pulmonary exam normal       Cardiovascular hypertension, Pt. on medications negative cardio ROS  Rhythm:Regular Rate:Normal     Neuro/Psych negative neurological ROS  negative psych ROS   GI/Hepatic negative GI ROS, Neg liver ROS,   Endo/Other  negative endocrine ROS  Renal/GU negative Renal ROS  negative genitourinary   Musculoskeletal negative musculoskeletal ROS (+)   Abdominal   Peds negative pediatric ROS (+)  Hematology negative hematology ROS (+)   Anesthesia Other Findings   Reproductive/Obstetrics negative OB ROS                           Anesthesia Physical  Anesthesia Plan  ASA: II  Anesthesia Plan: General   Post-op Pain Management:    Induction: Intravenous  Airway Management Planned: LMA  Additional Equipment:   Intra-op Plan:   Post-operative Plan:   Informed Consent: I have reviewed the patients History and Physical, chart, labs and discussed the procedure including the risks, benefits and alternatives for the proposed anesthesia with the patient or authorized representative who has indicated his/her understanding and acceptance.   Dental advisory given  Plan Discussed with: CRNA  Anesthesia Plan Comments:        Anesthesia Quick Evaluation

## 2012-11-07 NOTE — Op Note (Signed)
Pre-operative diagnosis :   Recurrent bladder cancer  Postoperative diagnosis:  Same  Operation:  Cystourethroscopy, photodocumentation of recurrent bladder cancer, transurethral resection of recurrent bladder cancer on the adjuvant right ureteral diverticulum, TUR bladder tumor of intra-diverticular bladder tumor on the right, TUR bladder tumor of intra-diverticular bladder tumor on the left, TUR bladder tumor of multiple bladder neck bladder tumors right and left, TUR of multiple bladder tumors of bladder stone and bladder base.  Surgeon:  Kathie Rhodes. Patsi Sears, MD  First assistant:  None  Anesthesia:  General LMA  Preparation: After appropriate preanesthesia, the patient is brought to the operative room, placed on the operating table in the dorsal supine position where general LMA anesthesia was introduced. He was then replaced in the dorsal lithotomy position with pubis was prepped with Betadine solution and draped in usual fashion. The arm band was double checked. The history was double checked.    Review history:   Problems  1. Benign Prostatic Hyperplasia Localized With Urinary Obstruction With Other Lower Urinary Tract  Symptoms 600.21  2. Bladder Cancer 188.9  3. Chronic Cystitis 595.2  4. 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 He is s/p cysto/TURBT on 04/17/12.  Hx of recurrent UTI's. Originally referred by Dr. Selena Batten for further evaluation of recurrent UTI's. He has hx micro- hematuria 10-15 yrs ago, thought 2ndary RMSF. He has now had recurrent UTI recently, and has had antibiotic prior to recent Left shoulder surgery. He has a hx of cigar smoking, but none x 10 yrs. No dysuria, no fever, or chills.  He used to drink 4-16 oz diet Coke/day, but now drinks < 1/day. He has increased his water intake. His weight has decreased from 245lbs to 200 lbs (6 weeks).   10/31/11 PSA - 0.4  Past Medical History  Problems  1. History of Anxiety (Symptom) 300.00  2. History of Arthritis V13.4  3. History of Diverticulosis 562.10  4. Former Smoker V15.82  5. History of Hypertension 401.9  6. History of Insomnia 780.52  7. History of [place] Spotted Fever 082.0   Statement of  Likelihood of Success: Excellent. TIME-OUT observed.:  Procedure: Cystourethroscopy was accomplished with the 30 scope. Multiple bladder neck bladder tumors were immediately identified, prolapsing into the prostatic fossa, left greater than right. Proximal this, the bladder base appeared within normal limits, but upon further evaluation, recurrent bladder cancer was identified at the right bladder diverticular edge, just lateral to the right ureteral orifice, as identified in the office. Further evaluation within the diverticulum showed diverticular base bladder cancer, identified only when the diverticulum his Foley distended, and hidden underneath the base lip of the diverticulum. In addition, bladder cancer identified in a left bladder wall diverticulum, identified 1 cm lateral and proximal to the left ureteral orifice. Multiple bladder tumors were identified and the bladder dome and the bladder base.  Using the Gyrus TUR element, all bladder tumors were resected, and sent to laboratory for examination, with particular attention to invasion in the bladder, and invasion into the prostate. Minimal bleeding was noted. The base of all tumors were is electrocoagulated. A size 22 two-way Foley catheter was placed with 30 cc in the Foley catheter. From the operating room, his bladder wash chemotherapy was released from the pharmacy, and will be given in recovery room.  The patient tolerated procedure well. He received a B. and  O. suppository at the beginning procedure. He was awakened, and taken to recovery room in good condition.

## 2012-11-07 NOTE — Anesthesia Procedure Notes (Signed)
Procedure Name: LMA Insertion Date/Time: 11/07/2012 11:03 AM Performed by: Jessica Priest Pre-anesthesia Checklist: Patient identified, Emergency Drugs available, Suction available and Patient being monitored Patient Re-evaluated:Patient Re-evaluated prior to inductionOxygen Delivery Method: Circle System Utilized Preoxygenation: Pre-oxygenation with 100% oxygen Intubation Type: IV induction Ventilation: Mask ventilation without difficulty LMA: LMA inserted LMA Size: 4.0 Number of attempts: 1 Airway Equipment and Method: bite block Placement Confirmation: positive ETCO2 Tube secured with: Tape Dental Injury: Teeth and Oropharynx as per pre-operative assessment

## 2012-11-07 NOTE — H&P (Signed)
Active Problems Problems  1. Benign Prostatic Hyperplasia Localized With Urinary Obstruction With Other Lower Urinary Tract  Symptoms 600.21 2. Bladder Cancer 188.9 3. Chronic Cystitis 595.2 4. 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  He is s/p cysto/TURBT on 04/17/12.    Hx of recurrent UTI's.  Originally referred by Dr. Selena Batten for further evaluation of recurrent UTI's. He has hx micro- hematuria 10-15 yrs ago, thought 2ndary RMSF. He has now had recurrent UTI recently, and has had antibiotic prior to recent Left shoulder surgery. He has a hx of cigar smoking, but none x 10 yrs. No dysuria, no fever, or chills.      He used to drink 4-16 oz diet Coke/day, but now drinks < 1/day. He has increased his water intake. His weight has decreased from 245lbs to 200 lbs (6 weeks).   10/31/11  PSA - 0.4   Past Medical History Problems  1. History of  Anxiety (Symptom) 300.00 2. History of  Arthritis V13.4 3. History of  Diverticulosis 562.10 4. Former Smoker V15.82 5. History of  Hypertension 401.9 6. History of  Insomnia 780.52 7. History of  [place] Spotted Fever 082.0  Surgical History Problems  1. History of  Cystoscopy With Fulguration Large Lesion (Over 5cm) 2. History of  Shoulder Surgery  Current Meds 1. Ambien TABS; Therapy: (Recorded:02Jan2014) to 2. Diovan TABS; Therapy: (Recorded:02Jan2014) to 3. Finasteride 5 MG Oral Tablet; TAKE ONE TABLET BY MOUTH DAILY; Therapy: 04Jun2014 to (Last  Rx:04Jun2014)  Requested for: 04Jun2014 4. Motrin TABS; Therapy: (Recorded:03Jan2014) to 5. Tamsulosin HCl 0.4 MG Oral Capsule; TAKE 1 CAPSULE EVERY DAY; Therapy: 06Mar2014 to  (Evaluate:04Jul2014); Last Rx:04Jun2014  Allergies Medication  1. No Known Drug Allergies  Family History Problems  1. Family history of  Family Health Status Number Of  Children 1 son, 1 daughters 2. Family history of  Father Deceased At Age 12 3. Family history of  Mother Deceased At Age 48  Social History Problems  1. Caffeine Use 2/day 2. Marital History - Currently Married 3. Never A Smoker Denied  4. History of  Alcohol Use  Review of Systems Genitourinary, constitutional, skin, eye, otolaryngeal, hematologic/lymphatic, cardiovascular, pulmonary, endocrine, musculoskeletal, gastrointestinal, neurological and psychiatric system(s) were reviewed and pertinent findings if present are noted.  Genitourinary: urinary frequency, feelings of urinary urgency, nocturia, weak urinary stream and hematuria, but urinary stream does not start and stop, no incomplete emptying of bladder, no urethral discharge, urine is not foul-smelling, urine not cloudy, no pelvic pain, no testicular pain, no scrotal pain, no scrotal mass and initiating urination does not require straining.  Gastrointestinal: no nausea, no vomiting, no heartburn, no diarrhea and no constipation.  Constitutional: no fever, no night sweats and not feeling tired (fatigue).  Integumentary: no new skin rashes or lesions and no pruritus.  Eyes: blurred vision, but no diplopia.  ENT: no sore throat and no sinus problems.  Hematologic/Lymphatic: no tendency to easily bruise and no swollen glands.  Cardiovascular: no chest pain and no leg swelling.  Respiratory: no shortness of breath and no cough.  Endocrine: no polydipsia.  Musculoskeletal: joint pain, but no back pain.  Neurological: no headache and no dizziness.  Psychiatric: anxiety, but no depression.    Vitals Vital Signs [Data Includes: Last 1 Day]  09Sep2014 11:23AM  Blood Pressure: 171 /  84 Temperature: 97.8 F Heart Rate: 88  Physical Exam Constitutional: Well nourished and well developed . No acute distress.  ENT:. The ears and nose are normal in appearance.  Neck: The appearance of the neck is normal.  Pulmonary: No respiratory  distress.  Cardiovascular:. No peripheral edema.  Abdomen: The abdomen is rounded. The abdomen is soft and nontender. No masses are palpated. No hernias are palpable.  Rectal: Rectal exam demonstrates normal sphincter tone. Estimated prostate size is 4+. Normal rectal tone, no rectal masses, prostate is smooth, symmetric and non-tender.  Genitourinary: Examination of the penis demonstrates no discharge, no masses, no lesions and a normal meatus. The penis is circumcised. The scrotum is without lesions. The right epididymis is palpably normal and non-tender. The left epididymis is palpably normal and non-tender. The right testis is non-tender and without masses. The left testis is non-tender and without masses.  Lymphatics: The femoral and inguinal nodes are not enlarged or tender.  Skin: Normal skin turgor, no visible rash and no visible skin lesions.  Neuro/Psych:. Mood and affect are appropriate.    Results/Data  Urine [Data Includes: Last 1 Day]   09Sep2014  COLOR YELLOW   APPEARANCE CLEAR   SPECIFIC GRAVITY 1.010   pH 6.0   GLUCOSE NEG mg/dL  BILIRUBIN NEG   KETONE NEG mg/dL  BLOOD SMALL   PROTEIN NEG mg/dL  UROBILINOGEN 0.2 mg/dL  NITRITE POS   LEUKOCYTE ESTERASE SMALL   SQUAMOUS EPITHELIAL/HPF NONE SEEN   WBC 7-10 WBC/hpf  RBC 0-2 RBC/hpf  BACTERIA MANY   CRYSTALS NONE SEEN   CASTS NONE SEEN    21 Oct 2012 11:07 AM   UA With REFLEX       COLOR YELLOW       APPEARANCE CLEAR       SPECIFIC GRAVITY 1.010       pH 6.0       GLUCOSE NEG       BILIRUBIN NEG       KETONE NEG       BLOOD SMALL       PROTEIN NEG       UROBILINOGEN 0.2       NITRITE POS       LEUKOCYTE ESTERASE SMALL       SQUAMOUS EPITHELIAL/HPF NONE SEEN       WBC 7-10       CRYSTALS NONE SEEN       CASTS NONE SEEN       RBC 0-2       BACTERIA MANY    Procedure  Procedure: Cystoscopy  Chaperone Present: Hassel Neth, CMA.  Indication: Hematuria. History of Urothelial Carcinoma.  Informed Consent:  Risks, benefits, and potential adverse events were discussed and informed consent was obtained from the patient.  Prep: The patient was prepped with betadine.  Anesthesia:. Local anesthesia was administered intraurethrally with 2% lidocaine jelly.  Antibiotic prophylaxis: Ciprofloxacin.  Procedure Note:  Urethral meatus:. No abnormalities.  Anterior urethra: No abnormalities.  Prostatic urethra: No abnormalities.  Bladder: Located on medial side of [place] diverticulum. Visulization was clear. The right ureteral orifice had clear efflux of urine and was ectopic. The left ureteral orifice was in the normal anatomic position. Examination of the bladder demonstrated a diverticulum located near the right ureteral orifice, on the right side, near the trigone of the bladder measuring approximately 3 cm. Multiple tumors were identified in the bladder. A papillary tumor was seen in the bladder measuring approximately 2 cm  in size. This tumor was located on the right side, on the anterior aspect, near the trigone of the bladder. The patient tolerated the procedure well.  Complications: None.    Assessment Assessed  1. Bladder Cancer 188.9 2. Neoplasm Of The Bladder 239.4 3. Diverticulum Of The Bladder 596.3 4. Benign Prostatic Hyperplasia Localized With Urinary Obstruction With Other Lower Urinary Tract  Symptoms 600.21   Recurrent TCC in [place] diverticulum. Right ureteral orifice identified in medial wall of tic.   Plan  Bladder Cancer (188.9)  1. Follow-up Schedule Surgery Office  Follow-up  Done: 09Sep2014    Cysto, and bx of turmor, and cauterization of base, and instill Mitomycin.  UA With REFLEX  Status: Resulted - Requires Verification  Done: 01Jan0001 12:00AM Ordered Today; For: Health Maintenance (V70.0); Ordered By: Jethro Bolus  Due: 11Sep2014 Marked Important; Last Updated By: Nathaniel Man   Discussion/Summary   cc: Dr. Dewayne Hatch Electronically signed by : Jethro Bolus, M.D.; Oct 21 2012  2:28PM

## 2012-11-07 NOTE — Transfer of Care (Signed)
Immediate Anesthesia Transfer of Care Note  Patient: Jeremiah Wright  Procedure(s) Performed: Procedure(s) (LRB): TRANSURETHRAL RESECTION OF BLADDER TUMOR WITH MITOMYCIN-C (N/A) CYSTOSCOPY WITH COLD CUP BIOPSY AND FULGERATION (N/A)  Patient Location: PACU  Anesthesia Type: General  Level of Consciousness: awake, sedated, patient cooperative and responds to stimulation  Airway & Oxygen Therapy: Patient Spontanous Breathing and Patient connected to face mask oxygen  Post-op Assessment: Report given to PACU RN, Post -op Vital signs reviewed and stable and Patient moving all extremities  Post vital signs: Reviewed and stable  Complications: No apparent anesthesia complications

## 2012-11-08 NOTE — Anesthesia Postprocedure Evaluation (Signed)
  Anesthesia Post-op Note  Patient: Jeremiah Wright  Procedure(s) Performed: Procedure(s) (LRB): TRANSURETHRAL RESECTION OF BLADDER TUMOR WITH MITOMYCIN-C (N/A) CYSTOSCOPY WITH COLD CUP BIOPSY AND FULGERATION (N/A)  Patient Location: PACU  Anesthesia Type: General  Level of Consciousness: awake and alert   Airway and Oxygen Therapy: Patient Spontanous Breathing  Post-op Pain: mild  Post-op Assessment: Post-op Vital signs reviewed, Patient's Cardiovascular Status Stable, Respiratory Function Stable, Patent Airway and No signs of Nausea or vomiting  Last Vitals:  Filed Vitals:   11/07/12 1525  BP: 140/74  Pulse: 78  Temp: 36.3 C  Resp: 18    Post-op Vital Signs: stable   Complications: No apparent anesthesia complications

## 2012-11-10 ENCOUNTER — Encounter (HOSPITAL_BASED_OUTPATIENT_CLINIC_OR_DEPARTMENT_OTHER): Payer: Self-pay | Admitting: Urology

## 2013-02-18 ENCOUNTER — Other Ambulatory Visit: Payer: Self-pay | Admitting: Urology

## 2013-02-23 ENCOUNTER — Encounter (HOSPITAL_BASED_OUTPATIENT_CLINIC_OR_DEPARTMENT_OTHER): Payer: Self-pay | Admitting: *Deleted

## 2013-02-23 NOTE — Progress Notes (Signed)
NPO AFTER MN. ARRIVE AT 0830. NEEDS ISTAT. CURRENT EKG IN EPIC AND CHART. WILL TAKE DIOVAN AND PRILOSEC AM DOS W/ SIPS OF WATER.

## 2013-03-02 ENCOUNTER — Encounter (HOSPITAL_BASED_OUTPATIENT_CLINIC_OR_DEPARTMENT_OTHER)
Admission: RE | Disposition: A | Discharge status: Home / Self Care | Payer: Self-pay | Source: Ambulatory Visit | Attending: Urology

## 2013-03-02 ENCOUNTER — Encounter (HOSPITAL_BASED_OUTPATIENT_CLINIC_OR_DEPARTMENT_OTHER): Payer: Self-pay

## 2013-03-02 ENCOUNTER — Ambulatory Visit (HOSPITAL_BASED_OUTPATIENT_CLINIC_OR_DEPARTMENT_OTHER)
Admission: RE | Admit: 2013-03-02 | Discharge: 2013-03-02 | Disposition: A | Discharge status: Home / Self Care | Payer: Medicare Other | Source: Ambulatory Visit | Attending: Urology | Admitting: Urology

## 2013-03-02 ENCOUNTER — Ambulatory Visit (HOSPITAL_BASED_OUTPATIENT_CLINIC_OR_DEPARTMENT_OTHER): Payer: Medicare Other | Admitting: Anesthesiology

## 2013-03-02 ENCOUNTER — Encounter (HOSPITAL_BASED_OUTPATIENT_CLINIC_OR_DEPARTMENT_OTHER): Payer: Medicare Other | Admitting: Anesthesiology

## 2013-03-02 DIAGNOSIS — N323 Diverticulum of bladder: Secondary | ICD-10-CM | POA: Insufficient documentation

## 2013-03-02 DIAGNOSIS — Z87891 Personal history of nicotine dependence: Secondary | ICD-10-CM | POA: Insufficient documentation

## 2013-03-02 DIAGNOSIS — Z79899 Other long term (current) drug therapy: Secondary | ICD-10-CM | POA: Insufficient documentation

## 2013-03-02 DIAGNOSIS — N400 Enlarged prostate without lower urinary tract symptoms: Secondary | ICD-10-CM | POA: Insufficient documentation

## 2013-03-02 DIAGNOSIS — C679 Malignant neoplasm of bladder, unspecified: Secondary | ICD-10-CM | POA: Insufficient documentation

## 2013-03-02 DIAGNOSIS — I10 Essential (primary) hypertension: Secondary | ICD-10-CM | POA: Insufficient documentation

## 2013-03-02 DIAGNOSIS — K219 Gastro-esophageal reflux disease without esophagitis: Secondary | ICD-10-CM | POA: Insufficient documentation

## 2013-03-02 HISTORY — PX: CYSTOSCOPY: SHX5120

## 2013-03-02 HISTORY — PX: TRANSURETHRAL RESECTION OF BLADDER TUMOR: SHX2575

## 2013-03-02 LAB — POCT I-STAT 4, (NA,K, GLUC, HGB,HCT)
Glucose, Bld: 109 mg/dL — ABNORMAL HIGH (ref 70–99)
HCT: 41 % (ref 39.0–52.0)
HEMOGLOBIN: 13.9 g/dL (ref 13.0–17.0)
Potassium: 4.4 mEq/L (ref 3.7–5.3)
Sodium: 142 mEq/L (ref 137–147)

## 2013-03-02 SURGERY — CYSTOSCOPY
Anesthesia: General | Site: Bladder

## 2013-03-02 MED ORDER — PROPOFOL 10 MG/ML IV BOLUS
INTRAVENOUS | Status: DC | PRN
  Administered 2013-03-02: 180 mg via INTRAVENOUS

## 2013-03-02 MED ORDER — MEPERIDINE HCL 25 MG/ML IJ SOLN
6.2500 mg | INTRAMUSCULAR | Status: DC | PRN
  Filled 2013-03-02: qty 1

## 2013-03-02 MED ORDER — LIDOCAINE HCL (CARDIAC) 20 MG/ML IV SOLN
INTRAVENOUS | Status: DC | PRN
  Administered 2013-03-02: 60 mg via INTRAVENOUS

## 2013-03-02 MED ORDER — SODIUM CHLORIDE 0.9 % IR SOLN
Status: DC | PRN
  Administered 2013-03-02: 4000 mL via INTRAVESICAL

## 2013-03-02 MED ORDER — TRIMETHOPRIM 100 MG PO TABS: 100.0000 mg | ORAL_TABLET | ORAL | Status: DC

## 2013-03-02 MED ORDER — PROMETHAZINE HCL 25 MG/ML IJ SOLN
6.2500 mg | INTRAMUSCULAR | Status: DC | PRN
  Filled 2013-03-02: qty 1

## 2013-03-02 MED ORDER — KETOROLAC TROMETHAMINE 30 MG/ML IJ SOLN
INTRAMUSCULAR | Status: DC | PRN
  Administered 2013-03-02: 30 mg via INTRAVENOUS

## 2013-03-02 MED ORDER — FENTANYL CITRATE 0.05 MG/ML IJ SOLN
INTRAMUSCULAR | Status: DC | PRN
  Administered 2013-03-02 (×2): 25 ug via INTRAVENOUS
  Administered 2013-03-02: 50 ug via INTRAVENOUS

## 2013-03-02 MED ORDER — DEXAMETHASONE SODIUM PHOSPHATE 4 MG/ML IJ SOLN
INTRAMUSCULAR | Status: DC | PRN
  Administered 2013-03-02: 10 mg via INTRAVENOUS

## 2013-03-02 MED ORDER — OXYCODONE HCL 5 MG PO TABS
5.0000 mg | ORAL_TABLET | Freq: Once | ORAL | Status: DC | PRN
  Filled 2013-03-02: qty 1

## 2013-03-02 MED ORDER — OXYCODONE HCL 5 MG/5ML PO SOLN
5.0000 mg | Freq: Once | ORAL | Status: DC | PRN
  Filled 2013-03-02: qty 5

## 2013-03-02 MED ORDER — MITOMYCIN CHEMO FOR BLADDER INSTILLATION 40 MG
40.0000 mg | Freq: Once | INTRAVENOUS | Status: AC
Start: 2013-03-02 — End: 2013-03-02
  Administered 2013-03-02: 40 mg via INTRAVESICAL
  Filled 2013-03-02: qty 40

## 2013-03-02 MED ORDER — PHENAZOPYRIDINE HCL 200 MG PO TABS
200.0000 mg | ORAL_TABLET | Freq: Three times a day (TID) | ORAL | Status: DC
  Administered 2013-03-02: 200 mg via ORAL
  Filled 2013-03-02: qty 2
  Filled 2013-03-02: qty 1

## 2013-03-02 MED ORDER — LACTATED RINGERS IV SOLN
INTRAVENOUS | Status: DC
Start: 2013-03-02 — End: 2013-03-02
  Administered 2013-03-02 (×2): via INTRAVENOUS
  Filled 2013-03-02: qty 1000

## 2013-03-02 MED ORDER — BELLADONNA ALKALOIDS-OPIUM 16.2-60 MG RE SUPP
RECTAL | Status: DC | PRN
  Administered 2013-03-02: 1 via RECTAL

## 2013-03-02 MED ORDER — BELLADONNA ALKALOIDS-OPIUM 16.2-60 MG RE SUPP
RECTAL | Status: AC
  Filled 2013-03-02: qty 1

## 2013-03-02 MED ORDER — HYDROMORPHONE HCL PF 1 MG/ML IJ SOLN
0.2500 mg | INTRAMUSCULAR | Status: DC | PRN
  Filled 2013-03-02: qty 1

## 2013-03-02 MED ORDER — ONDANSETRON HCL 4 MG/2ML IJ SOLN
INTRAMUSCULAR | Status: DC | PRN
  Administered 2013-03-02: 4 mg via INTRAVENOUS

## 2013-03-02 MED ORDER — TRAMADOL-ACETAMINOPHEN 37.5-325 MG PO TABS: 1.0000 | ORAL_TABLET | Freq: Four times a day (QID) | ORAL | Status: DC | PRN

## 2013-03-02 MED ORDER — PHENAZOPYRIDINE HCL 200 MG PO TABS: 200.0000 mg | ORAL_TABLET | Freq: Three times a day (TID) | ORAL | Status: DC | PRN

## 2013-03-02 MED ORDER — CEFAZOLIN SODIUM-DEXTROSE 2-3 GM-% IV SOLR
2.0000 g | INTRAVENOUS | Status: AC
  Administered 2013-03-02: 2 g via INTRAVENOUS
  Filled 2013-03-02: qty 50

## 2013-03-02 MED ORDER — FENTANYL CITRATE 0.05 MG/ML IJ SOLN
INTRAMUSCULAR | Status: AC
  Filled 2013-03-02: qty 4

## 2013-03-02 SURGICAL SUPPLY — 36 items
BAG DRAIN URO-CYSTO SKYTR STRL (DRAIN) ×3 IMPLANT
BAG URINE DRAINAGE (UROLOGICAL SUPPLIES) IMPLANT
BAG URINE LEG 19OZ MD ST LTX (BAG) IMPLANT
BOOTIES KNEE HIGH SLOAN (MISCELLANEOUS) ×3 IMPLANT
CANISTER SUCT LVC 12 LTR MEDI- (MISCELLANEOUS) ×6 IMPLANT
CATH FOLEY 2WAY SLVR  5CC 20FR (CATHETERS) ×2
CATH FOLEY 2WAY SLVR  5CC 22FR (CATHETERS) ×2
CATH FOLEY 2WAY SLVR 5CC 20FR (CATHETERS) ×1 IMPLANT
CATH FOLEY 2WAY SLVR 5CC 22FR (CATHETERS) ×1 IMPLANT
CLOTH BEACON ORANGE TIMEOUT ST (SAFETY) ×3 IMPLANT
DRAPE CAMERA CLOSED 9X96 (DRAPES) ×3 IMPLANT
ELECT LOOP HF 24-28F (CUTTING LOOP) IMPLANT
ELECT LOOP HF 26F 30D .35MM (CUTTING LOOP) IMPLANT
ELECT REM PT RETURN 9FT ADLT (ELECTROSURGICAL) ×3
ELECTRODE REM PT RTRN 9FT ADLT (ELECTROSURGICAL) ×1 IMPLANT
GLOVE BIO SURGEON STRL SZ7.5 (GLOVE) ×3 IMPLANT
GOWN PREVENTION PLUS LG XLONG (DISPOSABLE) IMPLANT
GOWN STRL REIN XL XLG (GOWN DISPOSABLE) IMPLANT
GOWN STRL REUS W/TWL XL LVL3 (GOWN DISPOSABLE) ×6 IMPLANT
GOWN XL W/COTTON TOWEL STD (GOWNS) ×3 IMPLANT
HOLDER FOLEY CATH W/STRAP (MISCELLANEOUS) ×3 IMPLANT
IV NS 1000ML (IV SOLUTION) ×2
IV NS 1000ML BAXH (IV SOLUTION) ×1 IMPLANT
IV NS IRRIG 3000ML ARTHROMATIC (IV SOLUTION) ×3 IMPLANT
LOOP CUTTING 24FR OLYMPUS (CUTTING LOOP) IMPLANT
NDL SAFETY ECLIPSE 18X1.5 (NEEDLE) IMPLANT
NEEDLE HYPO 18GX1.5 SHARP (NEEDLE)
NEEDLE HYPO 22GX1.5 SAFETY (NEEDLE) IMPLANT
NEEDLE SPNL 22GX7 QUINCKE BK (NEEDLE) IMPLANT
NS IRRIG 500ML POUR BTL (IV SOLUTION) IMPLANT
PACK CYSTOSCOPY (CUSTOM PROCEDURE TRAY) ×3 IMPLANT
PLUG CATH AND CAP STER (CATHETERS) IMPLANT
SET ASPIRATION TUBING (TUBING) ×3 IMPLANT
SYR 20CC LL (SYRINGE) IMPLANT
SYRINGE IRR TOOMEY STRL 70CC (SYRINGE) ×3 IMPLANT
WATER STERILE IRR 3000ML UROMA (IV SOLUTION) IMPLANT

## 2013-03-02 NOTE — Anesthesia Postprocedure Evaluation (Signed)
Anesthesia Post Note  Patient: Jeremiah Wright  Procedure(s) Performed: Procedure(s) (LRB): CYSTOSCOPY WITH INSTILLATION OF MITOMYCIN C (N/A) TRANSURETHRAL RESECTION OF BLADDER TUMOR (TURBT) (N/A)  Anesthesia type: General  Patient location: PACU  Post pain: Pain level controlled  Post assessment: Post-op Vital signs reviewed  Last Vitals: BP 141/78  Pulse 73  Temp(Src) 36.3 C (Oral)  Resp 16  Wt 184 lb (83.462 kg)  SpO2 98%  Post vital signs: Reviewed  Level of consciousness: sedated  Complications: No apparent anesthesia complications

## 2013-03-02 NOTE — Anesthesia Preprocedure Evaluation (Signed)
Anesthesia Evaluation  Patient identified by MRN, date of birth, ID band Patient awake    Reviewed: Allergy & Precautions, H&P , NPO status , Patient's Chart, lab work & pertinent test results  Airway Mallampati: II TM Distance: >3 FB Neck ROM: Full    Dental no notable dental hx. (+) Missing   Pulmonary former smoker,  breath sounds clear to auscultation  Pulmonary exam normal       Cardiovascular hypertension, Pt. on medications Rhythm:Regular Rate:Normal     Neuro/Psych negative neurological ROS  negative psych ROS   GI/Hepatic Neg liver ROS, GERD-  Medicated,  Endo/Other  negative endocrine ROS  Renal/GU negative Renal ROS     Musculoskeletal negative musculoskeletal ROS (+)   Abdominal   Peds  Hematology negative hematology ROS (+)   Anesthesia Other Findings   Reproductive/Obstetrics negative OB ROS                           Anesthesia Physical  Anesthesia Plan  ASA: II  Anesthesia Plan: General   Post-op Pain Management:    Induction: Intravenous  Airway Management Planned: LMA  Additional Equipment:   Intra-op Plan:   Post-operative Plan: Extubation in OR  Informed Consent: I have reviewed the patients History and Physical, chart, labs and discussed the procedure including the risks, benefits and alternatives for the proposed anesthesia with the patient or authorized representative who has indicated his/her understanding and acceptance.   Dental advisory given  Plan Discussed with: CRNA  Anesthesia Plan Comments:         Anesthesia Quick Evaluation

## 2013-03-02 NOTE — Discharge Instructions (Addendum)
Bladder Cancer Bladder cancer is an abnormal growth of tissue in your bladder. Your bladder is the balloon-like sac in your pelvis. It collects and stores urine that comes from the kidneys through the ureters. The bladder wall is made of layers. If cancer spreads into these layers and through the wall of the bladder, it becomes more difficult to treat.  There are four stages of bladder cancer:  Stage I. Cancer at this stage occurs in the bladder's inner lining but has not invaded the muscular bladder wall.  Stage II. At this stage, cancer has invaded the bladder wall but is still confined to the bladder.  Stage III. By this stage, the cancer cells have spread through the bladder wall to surrounding tissue. They may also have spread to the prostate in men or the uterus or vagina in women.  Stage IV. By this stage, cancer cells may have spread to the lymph nodes and other organs, such as your lungs, bones, or liver. RISK FACTORS Although the cause of bladder cancer is not known, the following risk factors can increase your chances of getting bladder cancer:   Smoking.   Occupational exposures, such as rubber, leather, textile, dyes, chemicals, and paint.   Being white.   Age.   Being male.   Having chronic bladder inflammation.   Having a bladder cancer history.   Having a family history of bladder cancer (heredity).   Having had chemotherapy or radiation therapy to the pelvis.   Being exposed to arsenic.  SYMPTOMS   Blood in the urine.   Pain with urination.   Frequent bladder or urine infections.  Increase in urgency and frequency of urination. DIAGNOSIS  Your health care provider may suspect bladder cancer based on your description of urinary symptoms or based on the finding of blood or infection in the urine (especially if this has recurred several times). Other tests or procedures that may be performed include:   A narrow tube being inserted into your  bladder through your urethra (cystoscopy) in order to view the lining of your bladder for tumors.   A biopsy to sample the tumor to see if cancer is present.  If cancer is present, it will then be staged to determine its severity and extent. It is important to know how deeply into the bladder wall the cancer has grown and whether the cancer has spread to any other parts of your body. Staging may require blood tests or special scans such as a CT scan, MRI, bone scan, or chest X-ray.  TREATMENT  Once your cancer has been diagnosed and staged, you should discuss a treatment plan with your health care provider. Based on the stage of the cancer, one treatment or a combination of treatments may be recommended. The most common forms of treatment are:   Surgery. Procedures that may be done include transurethral resection and cystectomy.  Radiation therapy. This is infrequently used to treat bladder cancer.   Chemotherapy. During this treatment, drugs are used to kill cancer cells.  Immunotherapy. This is usually administered directly into the bladder. HOME CARE INSTRUCTIONS  Only take over-the-counter or prescription medicines for pain, discomfort, or fever as directed by your health care provider.   Maintain a healthy diet.   Consider joining a support group. This may help you learn to cope with the stress of having bladder cancer.   Seek advice to help you manage treatment side effects.   Keep all follow-up appointments as directed by your health care  provider.   Inform your cancer specialist if you are admitted to the hospital.  [place] IF:  There is blood in your urine.  You have symptoms of a urinary tract infection. These include:  Tiredness.  Shakiness.  Weakness.  Muscle aches.  Abdominal pain.  Frequent and intense urge to urinate (in young women).  Burning feeling in the bladder or urethra during urination (in young women). SEEK IMMEDIATE MEDICAL  CARE IF:  You are unable to urinate. Document Released: 02/01/2003 Document Revised: 10/01/2012 Document Reviewed: 07/22/2012 [facility] Patient Information 2014 [place].  Post Anesthesia Home Care Instructions  Activity: Get plenty of rest for the remainder of the day. A responsible adult should stay with you for 24 hours following the procedure.  For the next 24 hours, DO NOT: -Drive a car -Paediatric nurse -Drink alcoholic beverages -Take any medication unless instructed by your physician -Make any legal decisions or sign important papers.  Meals: Start with liquid foods such as gelatin or soup. Progress to regular foods as tolerated. Avoid greasy, spicy, heavy foods. If nausea and/or vomiting occur, drink only clear liquids until the nausea and/or vomiting subsides. Call your physician if vomiting continues.  Special Instructions/Symptoms: Your throat may feel dry or sore from the anesthesia or the breathing tube placed in your throat during surgery. If this causes discomfort, gargle with warm salt water. The discomfort should disappear within 24 hours.

## 2013-03-02 NOTE — Interval H&P Note (Signed)
History and Physical Interval Note:  03/02/2013 8:08 AM  Jeremiah Wright  has presented today for surgery, with the diagnosis of bladder tumor  The various methods of treatment have been discussed with the patient and family. After consideration of risks, benefits and other options for treatment, the patient has consented to  Procedure(s): CYSTOSCOPY WITH INSTILLATION OF MITOMYCIN C (N/A) TRANSURETHRAL RESECTION OF BLADDER TUMOR (TURBT) (N/A) as a surgical intervention .  The patient's history has been reviewed, patient examined, no change in status, stable for surgery.  I have reviewed the patient's chart and labs.  Questions were answered to the patient's satisfaction.     Carolan Clines I

## 2013-03-02 NOTE — H&P (Signed)
Reason For Visit 3 mo cystoscopy   Active Problems Problems  1. 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 He is s/p cysto/TURBT of multiple tumors/instillation of Mitomycin on 11/07/12. Pathology shows low grade papillary urothelial carcinoma. Hx of bladder cancer. He is s/p cysto/TURBT on 04/17/12.     Hx of recurrent UTI's. Originally referred by Dr. Maudie Mercury for further evaluation of recurrent UTI's. He has hx micro- hematuria 10-15 yrs ago, thought 2ndary RMSF. He has now had recurrent UTI recently, and has had antibiotic prior to recent Left shoulder surgery. He has a hx of cigar smoking, but none x 10 yrs. No dysuria, no fever, or chills.       He used to drink 4-16 oz diet Coke/day, but now drinks < 1/day. He has increased his water intake. His weight has decreased from 245lbs to 200 lbs (6 weeks).     10/31/11 PSA - 0.4   Past Medical History Problems  1. History of Anxiety (300.00) 2. History of Arthritis (V13.4) 3. History of Diverticulosis (562.10) 4. Former smoker (V15.82) 5. History of hypertension (V12.59) 6. History of insomnia (V13.89) 7. History of [place] Spotted Fever (082.0)  Surgical History Problems  1. History of Cystoscopy With Fulguration Large Lesion (Over 5cm) 2. History of Cystoscopy With Fulguration Medium Lesion (2-5cm) 3. History of Shoulder Surgery  Current Meds 1. Ambien TABS;  Therapy: (Recorded:02Jan2014) to Recorded 2. Diovan TABS;  Therapy: (Recorded:02Jan2014) to Recorded 3. Finasteride 5 MG Oral Tablet; TAKE ONE TABLET BY MOUTH DAILY;  Therapy: [id number] to (Last [phone])  Requested for: [id number] Ordered 4. Motrin TABS;  Therapy: (ASNKNLZJ:[id number]) to Recorded 5. Tamsulosin HCl - 0.4 MG Oral Capsule; TAKE 1 CAPSULE EVERY DAY;  Therapy: 02IOX7353 to  (Evaluate:04Jul2014); Last Rx:04Jun2014 Ordered 6. Trimethoprim 100 MG Oral Tablet; take 1 tablet by mouth once daily;  Therapy: [id number] to (Evaluate:30Jun2015)  Requested for: [id number]; Last  Rx:02Dec2014 Ordered  Allergies Medication  1. No Known Drug Allergies  Family History Problems  1. Family history of Family Health Status Number Of Children   1 son, 1 daughters 2. Family history of Father Deceased At Age 24 3. Family history of Mother Deceased At Age 36  Social History Problems  1. Denied: History of Alcohol Use 2. Caffeine Use   2/day 3. Marital History - Currently Married 4. Never A Smoker  Review of Systems Genitourinary, constitutional, skin, eye, otolaryngeal, hematologic/lymphatic, cardiovascular, pulmonary, endocrine, musculoskeletal, gastrointestinal, neurological and psychiatric system(s) were reviewed and pertinent findings if present are noted.  Genitourinary: urinary frequency, feelings of urinary urgency, nocturia, weak urinary stream and hematuria, but urinary stream does not start and stop, no incomplete emptying of bladder, no urethral discharge, urine is not foul-smelling, urine not cloudy, no pelvic pain, no testicular pain, no scrotal pain, no scrotal mass and initiating urination does not require straining.  Gastrointestinal: no nausea, no vomiting, no heartburn, no diarrhea and no constipation.  Constitutional: no fever, no night sweats and not feeling tired (fatigue).  Integumentary: no new skin rashes or lesions and no pruritus.  Eyes: blurred vision, but no diplopia.  ENT: no sore throat and no sinus problems.  Hematologic/Lymphatic: no tendency to easily bruise and no swollen glands.  Cardiovascular: no chest pain and no leg swelling.  Respiratory: no shortness of breath and no cough.  Endocrine: no polydipsia.  Musculoskeletal: joint pain, but no back pain.  Neurological: no headache and no dizziness.  Psychiatric: anxiety, but no  depression.    Vitals Vital Signs [Data Includes: Last 1 Day]  Recorded: [id number] 02:21PM  Blood Pressure: 177 / 84 Temperature: 98.2 F Heart Rate: 86  Results/Data Urine [Data Includes: Last 1 Day]   [id number]  COLOR YELLOW   APPEARANCE CLEAR   SPECIFIC GRAVITY 1.025   pH 6.0   GLUCOSE NEG mg/dL  BILIRUBIN NEG   KETONE NEG mg/dL  BLOOD MOD   PROTEIN NEG mg/dL  UROBILINOGEN 0.2 mg/dL  NITRITE NEG   LEUKOCYTE ESTERASE NEG   SQUAMOUS EPITHELIAL/HPF NONE SEEN   WBC 0-2 WBC/hpf  RBC 3-6 RBC/hpf  BACTERIA NONE SEEN   CRYSTALS NONE SEEN   CASTS NONE SEEN    Procedure  Procedure: Cystoscopy  Chaperone Present: kim lewis.  Indication: History of Urothelial Carcinoma.  Informed Consent: Risks, benefits, and potential adverse events were discussed and informed consent was obtained from the patient.  Prep: The patient was prepped with betadine.  Anesthesia:. Local anesthesia was administered intraurethrally with 2% lidocaine jelly.  Antibiotic prophylaxis: Ciprofloxacin.  Procedure Note:  Urethral meatus:. No abnormalities.  Anterior urethra: No abnormalities.  Prostatic urethra: No abnormalities . The lateral prostatic lobes were enlarged. No intravesical median lobe was visualized.  Bladder: Examination of the bladder demonstrated a diverticulum. Multiple tumors were identified in the bladder. A papillary tumor was seen in the bladder measuring approximately 2 cm in size. This tumor was located on the anterior aspect, near the dome of the bladder. Another papillary tumor was seen in the bladder measuring approximately 1 cm in size. This tumor was located on the anterior aspect, near the dome, on the anterior aspect of the bladder. An additional papillary tumor was seen in the bladder measuring approximately 1 cm in size. This tumor was located on the anterior aspect, near the dome of the bladder. Another papillary tumor was seen in the bladder measuring approximately 1 cm in size.  This tumor was located near the dome of the bladder.    Assessment Assessed  1. Bladder cancer (188.9) 2. Diverticulum, bladder acquired (596.3)  4 apparently superficial tumor recurrences identified on the anterior surface of the bladder wall just left of the midline between the dome and the bladder neck.   Plan Health Maintenance  1. UA With REFLEX; [Do Not Release]; Status:Resulted - Requires Verification;   Done:  [id number] 02:03PM  TUR-BT and Mitomycin. Will eventually need BCG.   Discussion/Summary cc: Dr. Jani Gravel     Signatures Electronically signed by : Carolan Clines, M.D.; Feb 16 2013  3:47PM EST

## 2013-03-02 NOTE — Transfer of Care (Signed)
Immediate Anesthesia Transfer of Care Note  Patient: Aasir J Deguire  Procedure(s) Performed: Procedure(s) (LRB): CYSTOSCOPY WITH INSTILLATION OF MITOMYCIN C (N/A) TRANSURETHRAL RESECTION OF BLADDER TUMOR (TURBT) (N/A)  Patient Location: PACU  Anesthesia Type: General  Level of Consciousness: awake, alert  and oriented  Airway & Oxygen Therapy: Patient Spontanous Breathing and Patient connected to face mask oxygen  Post-op Assessment: Report given to PACU RN and Post -op Vital signs reviewed and stable  Post vital signs: Reviewed and stable  Complications: No apparent anesthesia complications

## 2013-03-02 NOTE — Op Note (Signed)
Pre-operative diagnosis :   Recurrent superficial bladder cancer  Postoperative diagnosis: Same(2 cm bladder dome bladder tumor x1; 4 superficial bladder tumors, Each measuring 1 cm, on the bladder dome, right posterior lateral surface, and left posterior lateral surface. BPH, bilateral peri-ureteral diverticula, with the right ureteral orifice on the medial side of the right diverticulum   Operation:  Cystourethroscopy, TUR multiple bladder tumors. Placement of 20 French Foley catheter (10 cc water in balloon). Instillation of mitomycin C. in recovery room   Surgeon:  S. Gaynelle Arabian, MD  First assistant: None   Anesthesia: General LMA   Preparation:  After appropriate preanesthesia, the patient was brought to the operating room, placed in the upper table in the dorsal supine position where general LMA anesthesia was introduced. He was replaced in the dorsal lithotomy position with pubis was prepped with Betadine solution and draped in usual fashion. The history was double checked. The arm band was double checked.   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 He is s/p cysto/TURBT of multiple tumors/instillation of Mitomycin on 11/07/12. Pathology shows low grade papillary urothelial carcinoma. Hx of bladder cancer. He is s/p cysto/TURBT on 04/17/12.  Hx of recurrent UTI's. Originally referred by Dr. Maudie Mercury for further evaluation of recurrent UTI's. He has hx micro- hematuria 10-15 yrs ago, thought 2ndary RMSF. He has now had recurrent UTI recently, and has had antibiotic prior to recent Left shoulder surgery. He has a hx of cigar smoking, but none x 10 yrs. No dysuria, no fever, or chills. Office cystoscopy showed 4 separate bladder tumors, 2 bladder tumors at the dome of the bladder, and a single bladder tumor in the right posterior lateral bladder wall and left posterior lateral bladder wall. He is now for TURBT and mitomycin instillation. The  bladder tumor sizes were 2 cm, times one tumor, and 1 cm for the remaining tumors.     Statement of  Likelihood of Success: Excellent. TIME-OUT observed.:A soft timeout was observed at the beginning procedure to note that there would be tissue sent to laboratory. A heart timeout was also observed in order to pass off tissue for surgeon to the nurse technician the room.   Procedure:  Cystourethroscopy was accomplished, noting that the patient is uncircumcised. The glans is normal. The meatus was normal. It did not need to be dilated. The entire urethra was too within normal limits. Bilobar BPH was noted. The bladder trigone was normal, but the right ureteral orifice was noted to be on the medial side of the diverticulum, as previous he noted. In addition, there was a left-sided diverticulum, but the diverticulum was approximately 1 cm lateral to the left ureteral orifice. This was photo documented.  Bladder tumors were identified in the bladder dome, measuring 2 cm, and 1 cm. In addition, 2 bladder tumors were noted on the left posterior lateral surface, measuring 1 cm, and a single 1 cm bladder tumor was noted on the right posterior lateral bladder wall. All bladder tumors were resected, and sent to laboratory as an aggregate for evaluation. A 20 French Foley catheter with 10 cc water in the balloon was placed to straight drainage. This will allow for mitomycin C. to be placed to recovery room. The patient was given IV Toradol, awakened, and taken to recovery room in good condition.

## 2013-03-04 ENCOUNTER — Encounter (HOSPITAL_BASED_OUTPATIENT_CLINIC_OR_DEPARTMENT_OTHER): Payer: Self-pay | Admitting: Urology

## 2013-09-04 ENCOUNTER — Other Ambulatory Visit: Payer: Self-pay | Admitting: Urology

## 2013-09-09 ENCOUNTER — Encounter (HOSPITAL_COMMUNITY): Payer: Self-pay | Admitting: Pharmacy Technician

## 2013-09-09 ENCOUNTER — Encounter (HOSPITAL_COMMUNITY)
Admission: RE | Admit: 2013-09-09 | Discharge: 2013-09-09 | Disposition: A | Discharge status: Home / Self Care | Payer: Medicare Other | Source: Ambulatory Visit | Attending: Urology | Admitting: Urology

## 2013-09-09 ENCOUNTER — Ambulatory Visit (HOSPITAL_COMMUNITY)
Admission: RE | Admit: 2013-09-09 | Discharge: 2013-09-09 | Disposition: A | Discharge status: Home / Self Care | Payer: Medicare Other | Source: Ambulatory Visit | Attending: Anesthesiology | Admitting: Anesthesiology

## 2013-09-09 ENCOUNTER — Encounter (INDEPENDENT_AMBULATORY_CARE_PROVIDER_SITE_OTHER): Payer: Self-pay

## 2013-09-09 ENCOUNTER — Encounter (HOSPITAL_COMMUNITY): Payer: Self-pay

## 2013-09-09 DIAGNOSIS — Z96619 Presence of unspecified artificial shoulder joint: Secondary | ICD-10-CM | POA: Insufficient documentation

## 2013-09-09 DIAGNOSIS — Z01810 Encounter for preprocedural cardiovascular examination: Secondary | ICD-10-CM | POA: Insufficient documentation

## 2013-09-09 DIAGNOSIS — Z01818 Encounter for other preprocedural examination: Secondary | ICD-10-CM | POA: Insufficient documentation

## 2013-09-09 DIAGNOSIS — Z01812 Encounter for preprocedural laboratory examination: Secondary | ICD-10-CM | POA: Insufficient documentation

## 2013-09-09 HISTORY — DX: Cardiac murmur, unspecified: R01.1

## 2013-09-09 LAB — CBC
HEMATOCRIT: 42.3 % (ref 39.0–52.0)
Hemoglobin: 14.5 g/dL (ref 13.0–17.0)
MCH: 31 pg (ref 26.0–34.0)
MCHC: 34.3 g/dL (ref 30.0–36.0)
MCV: 90.4 fL (ref 78.0–100.0)
PLATELETS: 237 10*3/uL (ref 150–400)
RBC: 4.68 MIL/uL (ref 4.22–5.81)
RDW: 11.6 % (ref 11.5–15.5)
WBC: 5.7 10*3/uL (ref 4.0–10.5)

## 2013-09-09 LAB — BASIC METABOLIC PANEL
ANION GAP: 12 (ref 5–15)
BUN: 14 mg/dL (ref 6–23)
CO2: 27 meq/L (ref 19–32)
CREATININE: 1.15 mg/dL (ref 0.50–1.35)
Calcium: 9.7 mg/dL (ref 8.4–10.5)
Chloride: 101 mEq/L (ref 96–112)
GFR calc Af Amer: 72 mL/min — ABNORMAL LOW (ref >90)
GFR calc non Af Amer: 62 mL/min — ABNORMAL LOW (ref >90)
GLUCOSE: 115 mg/dL — AB (ref 70–99)
Potassium: 4.8 mEq/L (ref 3.7–5.3)
Sodium: 140 mEq/L (ref 137–147)

## 2013-09-09 NOTE — Pre-Procedure Instructions (Signed)
EKG AND CXR WERE DONE TODAY - PREOP AT [facility]. 

## 2013-09-09 NOTE — Patient Instructions (Signed)
YOUR SURGERY IS SCHEDULED AT [facility]  ON:  Monday  8/3  REPORT TO  SHORT STAY CENTER AT:  7:00 AM   PLEASE COME IN THE [place] ENTRANCE AND FOLLOW SIGNS TO SHORT STAY CENTER.  DO NOT EAT OR DRINK ANYTHING AFTER MIDNIGHT THE NIGHT BEFORE YOUR SURGERY.  YOU MAY BRUSH YOUR TEETH, RINSE OUT YOUR MOUTH--BUT NO WATER, NO FOOD, NO CHEWING GUM, NO MINTS, NO CANDIES, NO CHEWING TOBACCO.  PLEASE TAKE THE FOLLOWING MEDICATIONS THE AM OF YOUR SURGERY WITH A FEW SIPS OF WATER:  FINASTERIDE, OMEPRAZOLE, FLOMAX, AND TRAMADOL.   DO NOT BRING VALUABLES, MONEY, CREDIT CARDS.  DO NOT WEAR JEWELRY, MAKE-UP, NAIL POLISH AND NO METAL PINS OR CLIPS IN YOUR HAIR. CONTACT LENS, DENTURES / PARTIALS, GLASSES SHOULD NOT BE WORN TO SURGERY AND IN MOST CASES-HEARING AIDS WILL NEED TO BE REMOVED.  BRING YOUR GLASSES CASE, ANY EQUIPMENT NEEDED FOR YOUR CONTACT LENS. FOR PATIENTS ADMITTED TO THE HOSPITAL--CHECK OUT TIME THE DAY OF DISCHARGE IS 11:00 AM.  ALL INPATIENT ROOMS ARE PRIVATE - WITH BATHROOM, TELEPHONE, TELEVISION AND WIFI INTERNET.  IF YOU ARE BEING DISCHARGED THE SAME DAY OF YOUR SURGERY--YOU CAN NOT DRIVE YOURSELF HOME--AND SHOULD NOT GO HOME ALONE BY TAXI OR BUS.  NO DRIVING OR OPERATING MACHINERY, OR MAKING LEGAL DECISIONS FOR 24 HOURS FOLLOWING ANESTHESIA / PAIN MEDICATIONS.  PLEASE MAKE ARRANGEMENTS FOR SOMEONE TO BE WITH YOU AT HOME THE FIRST 24 HOURS AFTER SURGERY. RESPONSIBLE DRIVER'S NAME / PHONE  PT'S WIFE UMPN [phone]                                                   [id number] READ OVER ANY  FACT SHEETS THAT YOU WERE GIVEN: MRSA INFORMATION, BLOOD TRANSFUSION INFORMATION, INCENTIVE SPIROMETER INFORMATION.  PLEASE BE AWARE THAT YOU MAY NEED ADDITIONAL BLOOD DRAWN DAY OF YOUR SURGERY  _______________________________________________________________________   [facility] - Preparing for Surgery Before surgery, you can play an important role.  Because skin is not sterile,  your skin needs to be as free of germs as possible.  You can reduce the number of germs on your skin by washing with CHG (chlorahexidine gluconate) soap before surgery.  CHG is an antiseptic cleaner which kills germs and bonds with the skin to continue killing germs even after washing. Please DO NOT use if you have an allergy to CHG or antibacterial soaps.  If your skin becomes reddened/irritated stop using the CHG and inform your nurse when you arrive at Short Stay. Do not shave (including legs and underarms) for at least 48 hours prior to the first CHG shower.  You may shave your face/neck. Please follow these instructions carefully:  1.  Shower with CHG Soap the night before surgery and the  morning of Surgery.  2.  If you choose to wash your hair, wash your hair first as usual with your  normal  shampoo.  3.  After you shampoo, rinse your hair and body thoroughly to remove the  shampoo.                           4.  Use CHG as you would any other liquid soap.  You can apply chg directly  to the skin and wash  Gently with a scrungie or clean washcloth.  5.  Apply the CHG Soap to your body ONLY FROM THE NECK DOWN.   Do not use on face/ open                           Wound or open sores. Avoid contact with eyes, ears mouth and genitals (private parts).                       Wash face,  Genitals (private parts) with your normal soap.             6.  Wash thoroughly, paying special attention to the area where your surgery  will be performed.  7.  Thoroughly rinse your body with warm water from the neck down.  8.  DO NOT shower/wash with your normal soap after using and rinsing off  the CHG Soap.                9.  Pat yourself dry with a clean towel.            10.  Wear clean pajamas.            11.  Place clean sheets on your bed the night of your first shower and do not  sleep with pets. Day of Surgery : Do not apply any lotions/deodorants the morning of surgery.  Please wear  clean clothes to the hospital/surgery center.  FAILURE TO FOLLOW THESE INSTRUCTIONS MAY RESULT IN THE CANCELLATION OF YOUR SURGERY PATIENT SIGNATURE_________________________________  NURSE SIGNATURE__________________________________  ________________________________________________________________________

## 2013-09-14 ENCOUNTER — Inpatient Hospital Stay (HOSPITAL_COMMUNITY): Payer: Medicare Other | Admitting: Anesthesiology

## 2013-09-14 ENCOUNTER — Encounter (HOSPITAL_COMMUNITY): Payer: Self-pay | Admitting: *Deleted

## 2013-09-14 ENCOUNTER — Encounter (HOSPITAL_COMMUNITY)
Admission: RE | Disposition: A | Discharge status: Home / Self Care | Payer: Self-pay | Source: Ambulatory Visit | Attending: Urology

## 2013-09-14 ENCOUNTER — Encounter (HOSPITAL_COMMUNITY): Payer: Medicare Other | Admitting: Anesthesiology

## 2013-09-14 ENCOUNTER — Inpatient Hospital Stay (HOSPITAL_COMMUNITY)
Admission: RE | Admit: 2013-09-14 | Discharge: 2013-09-18 | DRG: 666 | Disposition: A | Discharge status: Home / Self Care | Payer: Medicare Other | Source: Ambulatory Visit | Attending: Urology | Admitting: Urology

## 2013-09-14 DIAGNOSIS — C675 Malignant neoplasm of bladder neck: Secondary | ICD-10-CM | POA: Diagnosis present

## 2013-09-14 DIAGNOSIS — N183 Chronic kidney disease, stage 3 unspecified: Secondary | ICD-10-CM | POA: Diagnosis present

## 2013-09-14 DIAGNOSIS — R3989 Other symptoms and signs involving the genitourinary system: Secondary | ICD-10-CM | POA: Diagnosis present

## 2013-09-14 DIAGNOSIS — N323 Diverticulum of bladder: Secondary | ICD-10-CM | POA: Diagnosis present

## 2013-09-14 DIAGNOSIS — R310 Gross hematuria: Secondary | ICD-10-CM | POA: Diagnosis not present

## 2013-09-14 DIAGNOSIS — Z87440 Personal history of urinary (tract) infections: Secondary | ICD-10-CM

## 2013-09-14 DIAGNOSIS — C678 Malignant neoplasm of overlapping sites of bladder: Secondary | ICD-10-CM

## 2013-09-14 DIAGNOSIS — K219 Gastro-esophageal reflux disease without esophagitis: Secondary | ICD-10-CM | POA: Diagnosis present

## 2013-09-14 DIAGNOSIS — I129 Hypertensive chronic kidney disease with stage 1 through stage 4 chronic kidney disease, or unspecified chronic kidney disease: Secondary | ICD-10-CM | POA: Diagnosis present

## 2013-09-14 DIAGNOSIS — N3289 Other specified disorders of bladder: Secondary | ICD-10-CM | POA: Diagnosis not present

## 2013-09-14 DIAGNOSIS — C679 Malignant neoplasm of bladder, unspecified: Secondary | ICD-10-CM | POA: Diagnosis present

## 2013-09-14 DIAGNOSIS — C61 Malignant neoplasm of prostate: Secondary | ICD-10-CM | POA: Diagnosis present

## 2013-09-14 DIAGNOSIS — N400 Enlarged prostate without lower urinary tract symptoms: Secondary | ICD-10-CM | POA: Diagnosis present

## 2013-09-14 DIAGNOSIS — Z87891 Personal history of nicotine dependence: Secondary | ICD-10-CM

## 2013-09-14 HISTORY — PX: TRANSURETHRAL RESECTION OF BLADDER TUMOR: SHX2575

## 2013-09-14 HISTORY — PX: CYSTOSCOPY WITH RETROGRADE PYELOGRAM, URETEROSCOPY AND STENT PLACEMENT: SHX5789

## 2013-09-14 SURGERY — CYSTOURETEROSCOPY, WITH RETROGRADE PYELOGRAM AND STENT INSERTION
Anesthesia: General

## 2013-09-14 MED ORDER — ZOLPIDEM TARTRATE 10 MG PO TABS: 10.0000 mg | ORAL_TABLET | Freq: Every evening | ORAL | Status: DC | PRN

## 2013-09-14 MED ORDER — HYDROCORTISONE 1 % EX CREA
1.0000 "application " | TOPICAL_CREAM | Freq: Two times a day (BID) | CUTANEOUS | Status: DC
  Administered 2013-09-14 – 2013-09-18 (×8): 1 via TOPICAL
  Filled 2013-09-14 (×2): qty 28

## 2013-09-14 MED ORDER — OXYCODONE HCL 5 MG/5ML PO SOLN
5.0000 mg | Freq: Once | ORAL | Status: DC | PRN
  Filled 2013-09-14: qty 5

## 2013-09-14 MED ORDER — ZOLPIDEM TARTRATE 5 MG PO TABS
5.0000 mg | ORAL_TABLET | Freq: Every evening | ORAL | Status: DC | PRN
  Administered 2013-09-14 – 2013-09-17 (×4): 5 mg via ORAL
  Filled 2013-09-14 (×4): qty 1

## 2013-09-14 MED ORDER — ZOLPIDEM TARTRATE 5 MG PO TABS: 5.0000 mg | ORAL_TABLET | Freq: Every evening | ORAL | Status: DC | PRN

## 2013-09-14 MED ORDER — MEPERIDINE HCL 50 MG/ML IJ SOLN: 6.2500 mg | INTRAMUSCULAR | Status: DC | PRN

## 2013-09-14 MED ORDER — DEXAMETHASONE SODIUM PHOSPHATE 10 MG/ML IJ SOLN
INTRAMUSCULAR | Status: AC
  Filled 2013-09-14: qty 1

## 2013-09-14 MED ORDER — HYDROMORPHONE HCL PF 1 MG/ML IJ SOLN
0.5000 mg | INTRAMUSCULAR | Status: DC | PRN
  Administered 2013-09-15 – 2013-09-17 (×4): 0.5 mg via INTRAVENOUS
  Filled 2013-09-14 (×4): qty 1

## 2013-09-14 MED ORDER — PROPOFOL 10 MG/ML IV BOLUS
INTRAVENOUS | Status: DC | PRN
  Administered 2013-09-14: 150 mg via INTRAVENOUS

## 2013-09-14 MED ORDER — CEFAZOLIN SODIUM-DEXTROSE 2-3 GM-% IV SOLR
INTRAVENOUS | Status: AC
  Filled 2013-09-14: qty 50

## 2013-09-14 MED ORDER — HYDROMORPHONE HCL PF 1 MG/ML IJ SOLN
0.2500 mg | INTRAMUSCULAR | Status: DC | PRN
  Administered 2013-09-14 (×4): 0.5 mg via INTRAVENOUS

## 2013-09-14 MED ORDER — CEFAZOLIN SODIUM-DEXTROSE 2-3 GM-% IV SOLR
2.0000 g | INTRAVENOUS | Status: AC
  Administered 2013-09-14: 2 g via INTRAVENOUS

## 2013-09-14 MED ORDER — FENTANYL CITRATE 0.05 MG/ML IJ SOLN
INTRAMUSCULAR | Status: DC | PRN
  Administered 2013-09-14 (×2): 50 ug via INTRAVENOUS
  Administered 2013-09-14: 25 ug via INTRAVENOUS

## 2013-09-14 MED ORDER — DIPHENHYDRAMINE HCL 50 MG/ML IJ SOLN: 12.5000 mg | Freq: Four times a day (QID) | INTRAMUSCULAR | Status: DC | PRN

## 2013-09-14 MED ORDER — SODIUM CHLORIDE 0.9 % IJ SOLN
INTRAMUSCULAR | Status: AC
  Filled 2013-09-14: qty 10

## 2013-09-14 MED ORDER — ONDANSETRON HCL 4 MG/2ML IJ SOLN
INTRAMUSCULAR | Status: AC
  Filled 2013-09-14: qty 2

## 2013-09-14 MED ORDER — KETOROLAC TROMETHAMINE 30 MG/ML IJ SOLN
INTRAMUSCULAR | Status: DC | PRN
  Administered 2013-09-14: 30 mg via INTRAVENOUS

## 2013-09-14 MED ORDER — EPHEDRINE SULFATE 50 MG/ML IJ SOLN
INTRAMUSCULAR | Status: AC
Start: 2013-09-14 — End: 2013-09-14
  Filled 2013-09-14: qty 1

## 2013-09-14 MED ORDER — KETOROLAC TROMETHAMINE 30 MG/ML IJ SOLN
INTRAMUSCULAR | Status: AC
  Filled 2013-09-14: qty 1

## 2013-09-14 MED ORDER — BELLADONNA ALKALOIDS-OPIUM 16.2-60 MG RE SUPP
RECTAL | Status: DC | PRN
  Administered 2013-09-14: 1 via RECTAL

## 2013-09-14 MED ORDER — LIDOCAINE HCL (CARDIAC) 20 MG/ML IV SOLN
INTRAVENOUS | Status: AC
Start: 2013-09-14 — End: 2013-09-14
  Filled 2013-09-14: qty 5

## 2013-09-14 MED ORDER — HYDROMORPHONE HCL PF 1 MG/ML IJ SOLN
INTRAMUSCULAR | Status: AC
  Filled 2013-09-14: qty 1

## 2013-09-14 MED ORDER — OXYCODONE-ACETAMINOPHEN 5-325 MG PO TABS
1.0000 | ORAL_TABLET | ORAL | Status: DC | PRN
  Administered 2013-09-14 – 2013-09-15 (×2): 1 via ORAL
  Administered 2013-09-15: 2 via ORAL
  Filled 2013-09-14: qty 2
  Filled 2013-09-14: qty 1
  Filled 2013-09-14: qty 2

## 2013-09-14 MED ORDER — BELLADONNA ALKALOIDS-OPIUM 16.2-60 MG RE SUPP
RECTAL | Status: AC
  Filled 2013-09-14: qty 1

## 2013-09-14 MED ORDER — FENTANYL CITRATE 0.05 MG/ML IJ SOLN
INTRAMUSCULAR | Status: AC
  Filled 2013-09-14: qty 2

## 2013-09-14 MED ORDER — OXYBUTYNIN CHLORIDE 5 MG PO TABS
5.0000 mg | ORAL_TABLET | Freq: Three times a day (TID) | ORAL | Status: DC | PRN
  Administered 2013-09-14 – 2013-09-15 (×2): 5 mg via ORAL
  Filled 2013-09-14 (×3): qty 1

## 2013-09-14 MED ORDER — ACETAMINOPHEN 325 MG PO TABS: 650.0000 mg | ORAL_TABLET | ORAL | Status: DC | PRN

## 2013-09-14 MED ORDER — DIPHENHYDRAMINE HCL 12.5 MG/5ML PO ELIX
12.5000 mg | ORAL_SOLUTION | Freq: Four times a day (QID) | ORAL | Status: DC | PRN
Start: 2013-09-14 — End: 2013-09-18

## 2013-09-14 MED ORDER — PANTOPRAZOLE SODIUM 40 MG PO TBEC
40.0000 mg | DELAYED_RELEASE_TABLET | Freq: Every day | ORAL | Status: DC
  Administered 2013-09-15 – 2013-09-18 (×4): 40 mg via ORAL
  Filled 2013-09-14 (×4): qty 1

## 2013-09-14 MED ORDER — ONDANSETRON HCL 4 MG/2ML IJ SOLN
INTRAMUSCULAR | Status: DC | PRN
  Administered 2013-09-14: 4 mg via INTRAVENOUS

## 2013-09-14 MED ORDER — LIDOCAINE HCL 2 % EX GEL
CUTANEOUS | Status: AC
  Filled 2013-09-14: qty 10

## 2013-09-14 MED ORDER — PROPOFOL 10 MG/ML IV BOLUS
INTRAVENOUS | Status: AC
  Filled 2013-09-14: qty 20

## 2013-09-14 MED ORDER — OXYCODONE HCL 5 MG PO TABS: 5.0000 mg | ORAL_TABLET | Freq: Once | ORAL | Status: DC | PRN

## 2013-09-14 MED ORDER — DEXAMETHASONE SODIUM PHOSPHATE 10 MG/ML IJ SOLN
INTRAMUSCULAR | Status: DC | PRN
  Administered 2013-09-14: 10 mg via INTRAVENOUS

## 2013-09-14 MED ORDER — IRBESARTAN 75 MG PO TABS
75.0000 mg | ORAL_TABLET | Freq: Every day | ORAL | Status: DC
  Administered 2013-09-14 – 2013-09-18 (×5): 75 mg via ORAL
  Filled 2013-09-14 (×5): qty 1

## 2013-09-14 MED ORDER — ACETAMINOPHEN 10 MG/ML IV SOLN
1000.0000 mg | Freq: Once | INTRAVENOUS | Status: AC
  Administered 2013-09-14: 1000 mg via INTRAVENOUS
  Filled 2013-09-14: qty 100

## 2013-09-14 MED ORDER — LIDOCAINE HCL (CARDIAC) 20 MG/ML IV SOLN
INTRAVENOUS | Status: DC | PRN
Start: 2013-09-14 — End: 2013-09-14
  Administered 2013-09-14: 60 mg via INTRAVENOUS

## 2013-09-14 MED ORDER — SODIUM CHLORIDE 0.45 % IV SOLN
INTRAVENOUS | Status: DC
  Administered 2013-09-14: 50 mL via INTRAVENOUS
  Administered 2013-09-15 – 2013-09-16 (×3): via INTRAVENOUS

## 2013-09-14 MED ORDER — IOHEXOL 300 MG/ML  SOLN
INTRAMUSCULAR | Status: DC | PRN
  Administered 2013-09-14: 10 mL

## 2013-09-14 MED ORDER — SODIUM CHLORIDE 0.9 % IR SOLN
3000.0000 mL | Status: DC
  Administered 2013-09-14 – 2013-09-17 (×2): 3000 mL

## 2013-09-14 MED ORDER — KETOROLAC TROMETHAMINE 15 MG/ML IJ SOLN
15.0000 mg | Freq: Four times a day (QID) | INTRAMUSCULAR | Status: AC
  Administered 2013-09-14 – 2013-09-15 (×6): 15 mg via INTRAVENOUS
  Filled 2013-09-14 (×6): qty 1

## 2013-09-14 MED ORDER — SODIUM CHLORIDE 0.9 % IR SOLN
Status: DC | PRN
  Administered 2013-09-14: 12000 mL via INTRAVESICAL

## 2013-09-14 MED ORDER — CIPROFLOXACIN HCL 500 MG PO TABS
500.0000 mg | ORAL_TABLET | Freq: Two times a day (BID) | ORAL | Status: DC
  Administered 2013-09-14 – 2013-09-18 (×8): 500 mg via ORAL
  Filled 2013-09-14 (×11): qty 1

## 2013-09-14 MED ORDER — LACTATED RINGERS IV SOLN
INTRAVENOUS | Status: DC | PRN
  Administered 2013-09-14: 08:00:00 via INTRAVENOUS

## 2013-09-14 MED ORDER — PROMETHAZINE HCL 25 MG/ML IJ SOLN: 6.2500 mg | INTRAMUSCULAR | Status: DC | PRN

## 2013-09-14 SURGICAL SUPPLY — 24 items
BAG URINE DRAINAGE (UROLOGICAL SUPPLIES) ×4 IMPLANT
BAG URO CATCHER STRL LF (DRAPE) ×4 IMPLANT
CATH HEMA 3WAY 30CC 22FR COUDE (CATHETERS) ×4 IMPLANT
CATH INTERMIT  6FR 70CM (CATHETERS) ×4 IMPLANT
CLOTH BEACON ORANGE TIMEOUT ST (SAFETY) ×4 IMPLANT
DRAPE CAMERA CLOSED 9X96 (DRAPES) ×4 IMPLANT
ELECT BUTTON HF 24-28F 2 30DE (ELECTRODE) IMPLANT
ELECT LOOP MED HF 24F 12D (CUTTING LOOP) ×4 IMPLANT
ELECT LOOP MED HF 24F 12D CBL (CLIP) IMPLANT
ELECT RESECT VAPORIZE 12D CBL (ELECTRODE) ×4 IMPLANT
GLOVE BIOGEL M STRL SZ7.5 (GLOVE) ×4 IMPLANT
GOWN STRL REUS W/TWL LRG LVL3 (GOWN DISPOSABLE) ×4 IMPLANT
GOWN STRL REUS W/TWL XL LVL3 (GOWN DISPOSABLE) ×4 IMPLANT
GUIDEWIRE STR DUAL SENSOR (WIRE) IMPLANT
HOLDER FOLEY CATH W/STRAP (MISCELLANEOUS) ×4 IMPLANT
IV NS IRRIG 3000ML ARTHROMATIC (IV SOLUTION) ×24 IMPLANT
KIT ASPIRATION TUBING (SET/KITS/TRAYS/PACK) ×4 IMPLANT
MANIFOLD NEPTUNE II (INSTRUMENTS) ×4 IMPLANT
NS IRRIG 1000ML POUR BTL (IV SOLUTION) ×4 IMPLANT
PACK CYSTO (CUSTOM PROCEDURE TRAY) ×4 IMPLANT
SCRUB PCMX 4 OZ (MISCELLANEOUS) ×4 IMPLANT
SYRINGE IRR TOOMEY STRL 70CC (SYRINGE) ×4 IMPLANT
TUBING CONNECTING 10 (TUBING) ×3 IMPLANT
TUBING CONNECTING 10' (TUBING) ×1

## 2013-09-14 NOTE — Transfer of Care (Signed)
Immediate Anesthesia Transfer of Care Note  Patient: Jeremiah Wright  Procedure(s) Performed: Procedure(s): CYSTO BILATERAL RETROGRADE PYELOGRAM (Bilateral) TRANSURETHRAL RESECTION OF BLADDER TUMOR (TURBT)/TURP WITH GYRUS (N/A)  Patient Location: PACU  Anesthesia Type:General  Level of Consciousness: awake, alert  and oriented  Airway & Oxygen Therapy: Patient Spontanous Breathing and Patient connected to face mask oxygen  Post-op Assessment: Report given to PACU RN and Post -op Vital signs reviewed and stable  Post vital signs: Reviewed and stable  Complications: No apparent anesthesia complications

## 2013-09-14 NOTE — Interval H&P Note (Signed)
History and Physical Interval Note:  09/14/2013 8:30 AM  Jeremiah Wright  has presented today for surgery, with the diagnosis of BLADDER CANCER  The various methods of treatment have been discussed with the patient and family. After consideration of risks, benefits and other options for treatment, the patient has consented to  Procedure(s): CYSTO BILATERAL RETROGRADE PYELOGRAM (Bilateral) TRANSURETHRAL RESECTION OF BLADDER TUMOR (TURBT)/TURP WITH GYRUS (N/A) as a surgical intervention .  The patient's history has been reviewed, patient examined, no change in status, stable for surgery.  I have reviewed the patient's chart and labs.  Questions were answered to the patient's satisfaction.     Carolan Clines I

## 2013-09-14 NOTE — Anesthesia Postprocedure Evaluation (Signed)
Anesthesia Post Note  Patient: Jeremiah Wright  Procedure(s) Performed: Procedure(s) (LRB): CYSTO BILATERAL RETROGRADE PYELOGRAM (Bilateral) TRANSURETHRAL RESECTION OF BLADDER TUMOR (TURBT)/TURP WITH GYRUS (N/A)  Anesthesia type: General  Patient location: PACU  Post pain: Pain level controlled  Post assessment: Post-op Vital signs reviewed  Last Vitals: BP 154/87  Pulse 80  Temp(Src) 36.5 C (Oral)  Resp 14  Ht 5\' 6"  (1.676 m)  Wt 199 lb (90.266 kg)  BMI 32.13 kg/m2  SpO2 96%  Post vital signs: Reviewed  Level of consciousness: sedated  Complications: No apparent anesthesia complications

## 2013-09-14 NOTE — H&P (Signed)
Reason For Visit Cystoscopy   Active Problems Problems  1. Benign localized prostatic hyperplasia with lower urinary tract symptoms  (LUTS) (600.21,599.69)   Assessed By: Carolan Clines (Urology); Last Assessed: 24 Aug 2013 2. Chronic kidney disease, stage III (moderate) (585.3)   Assessed By: Carolan Clines (Urology); Last Assessed: 24 Aug 2013 3. Diverticulum, bladder acquired (596.3)   Assessed By: Carolan Clines (Urology); Last Assessed: 24 Aug 2013 4. 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 He is s/p BCG instillations x 6 on 05/20/13. He is s/p cysto/TURBT of multiple tumors/instillation of Mitocycin on 03/02/13. He is s/p cysto/TURBT of multiple tumors/instillation of Mitomycin on 11/07/12. Pathology shows low grade papillary urothelial carcinoma. Hx of bladder cancer. He is s/p cysto/TURBT on 04/17/12.     Hx of recurrent UTI's. Originally referred by Dr. Maudie Mercury for further evaluation of recurrent UTI's. He has hx micro- hematuria 10-15 yrs ago, thought 2ndary RMSF. He has now had recurrent UTI recently, and has had antibiotic prior to recent Left shoulder surgery. He has a hx of cigar smoking, but none x 10 yrs. No dysuria, no fever, or chills.       He used to drink 4-16 oz diet Coke/day, but now drinks < 1/day. He has increased his water intake. His weight has decreased from 245lbs to 200 lbs (6 weeks).     10/31/11 PSA - 0.4   Past Medical History Problems  1. History of Anxiety (300.00) 2. History of Arthritis (V13.4) 3. History of Diverticulosis (562.10) 4. Former smoker (V15.82) 5. History of hypertension (V12.59) 6. History of insomnia (V13.89) 7. History of [place] Spotted Fever (082.0)  Surgical History Problems  1. History  of Bladder Injection Of Cancer Treatment 2. History of Cystoscopy With Fulguration Large Lesion (Over 5cm) 3. History of Cystoscopy With Fulguration Medium Lesion (2-5cm) 4. History of Cystoscopy With Fulguration Small Lesion (5-5mm) 5. History of Shoulder Surgery  Current Meds 1. Ambien TABS;  Therapy: (Recorded:02Jan2014) to Recorded 2. Finasteride 5 MG Oral Tablet; take 1 tablet by mouth once daily;  Therapy: [id number] to (Evaluate:23Jun2016)  Requested for: [phone];  Last Rx:29Jun2015 Ordered 3. Motrin TABS;  Therapy: (MIWOEHOZ:[id number]) to Recorded 4. Tamsulosin HCl - 0.4 MG Oral Capsule; TAKE 1 CAPSULE EVERY DAY;  Therapy: [id number] to (Evaluate:04Jul2014); Last Rx:04Jun2014 Ordered 5. Tramadol-Acetaminophen 37.5-325 MG Oral Tablet;  Therapy: [id number] to Recorded 6. Trimethoprim 100 MG Oral Tablet; take 1 tablet by mouth once daily;  Therapy: [id number] to (Evaluate:30Jun2015)  Requested for:  [id number]; Last Rx:02Dec2014 Ordered 7. Valsartan 320 MG Oral Tablet;  Therapy: 80XKP5374 to Recorded  Allergies Medication  1. No Known Drug Allergies  Family History Problems  1. Family history of Family Health Status Number Of Children   1 son, 1 daughters 2. Family history of Father Deceased At Age 79 3. Family history of Mother Deceased At Age 25  Social History Problems  1. Denied: History of Alcohol Use 2. Caffeine Use   2/day 3. Marital History - Currently Married 4. Never A Smoker  Review of Systems Genitourinary, constitutional, skin, eye, otolaryngeal, hematologic/lymphatic, cardiovascular, pulmonary, endocrine, musculoskeletal, gastrointestinal, neurological and psychiatric system(s) were reviewed and pertinent findings if present are noted.  Genitourinary: urinary frequency, feelings of urinary  urgency, nocturia, weak urinary stream and hematuria, but urinary stream does not start and stop, no incomplete emptying of bladder, no urethral discharge, urine is  not foul-smelling, urine not cloudy, no pelvic pain, no testicular pain, no scrotal pain, no scrotal mass and initiating urination does not require straining.  Gastrointestinal: no nausea, no vomiting, no heartburn, no diarrhea and no constipation.  Constitutional: no fever, no night sweats and not feeling tired (fatigue).  Integumentary: no new skin rashes or lesions and no pruritus.  Eyes: blurred vision, but no diplopia.  ENT: no sore throat and no sinus problems.  Hematologic/Lymphatic: no tendency to easily bruise and no swollen glands.  Cardiovascular: no chest pain and no leg swelling.  Respiratory: no shortness of breath and no cough.  Endocrine: no polydipsia.  Musculoskeletal: joint pain, but no back pain.  Neurological: no headache and no dizziness.  Psychiatric: anxiety, but no depression.    Vitals Vital Signs [Data Includes: Last 1 Day]  Recorded: [id number] 02:31PM  Blood Pressure: 194 / 101 Temperature: 97.2 F Heart Rate: 99  Physical Exam Constitutional: Well nourished and well developed . No acute distress.  ENT:. The ears and nose are normal in appearance.  Neck: The appearance of the neck is normal.  Pulmonary: No respiratory distress.  Cardiovascular:. No peripheral edema.  Abdomen: The abdomen is rounded. The abdomen is soft and nontender. No masses are palpated. No hernias are palpable.  Genitourinary: Examination of the penis demonstrates no discharge, no masses, no lesions and a normal meatus. The penis is circumcised. The scrotum is without lesions. The right epididymis is palpably normal and non-tender. The left epididymis is palpably normal and non-tender. The right testis is non-tender and without masses. The left testis is non-tender and without masses.  Lymphatics: The femoral and inguinal nodes are not enlarged or tender.  Skin: Normal skin turgor, no visible rash and no visible skin lesions.  Neuro/Psych:. Mood and affect are appropriate.     Results/Data Urine [Data Includes: Last 1 Day]   [id number]  COLOR YELLOW   APPEARANCE CLEAR   SPECIFIC GRAVITY <1.005   pH 6.0   GLUCOSE NEG mg/dL  BILIRUBIN NEG   KETONE NEG mg/dL  BLOOD MOD   PROTEIN NEG mg/dL  UROBILINOGEN 0.2 mg/dL  NITRITE NEG   LEUKOCYTE ESTERASE NEG   SQUAMOUS EPITHELIAL/HPF RARE   WBC 0-2 WBC/hpf  RBC 0-2 RBC/hpf  BACTERIA NONE SEEN   CRYSTALS NONE SEEN   CASTS NONE SEEN    Procedure  Procedure: Cystoscopy  Chaperone Present: kim lewis.  Indication: History of Urothelial Carcinoma.  Informed Consent: Risks, benefits, and potential adverse events were discussed and informed consent was obtained from the patient.  Prep: The patient was prepped with betadine.  Anesthesia:. Local anesthesia was administered intraurethrally with 2% lidocaine jelly.  Antibiotic prophylaxis: Ciprofloxacin.  Procedure Note:  Urethral meatus:. No abnormalities.  Anterior urethra: No abnormalities.  Prostatic urethra:. The lateral prostatic lobes were enlarged. An enlarged intravesical median lobe was visualized. A papillary tumor was identified in the prostatic urethra and located anteriorly measuring 2 cm.  Bladder: Visulization was clear. Examination of the bladder demonstrated a diverticulum located near the right ureteral orifice, on the anterior aspect, on the right side of the bladder measuring approximately 2 cm bilateral Hytch tics. Right u.o part of base of tic. Bladder tumor lateral to u.o. Multiple tumors were identified in the bladder. A papillary tumor was seen in the bladder measuring approximately 1 cm in size. This  tumor was located on the left side of the bladder. Another papillary tumor was seen in the bladder. This tumor was located at the neck, on the anterior aspect of the bladder. An additional papillary tumor was seen in the bladder measuring approximately 1 cm in size. This tumor was located on the right side, on the anterior aspect, near the trigone of the  bladder. Another papillary tumor was seen in the bladder measuring approximately 1 cm in size. This tumor was located on the left side, near the trigone of the bladder.    Assessment Assessed  1. Benign localized prostatic hyperplasia with lower urinary tract symptoms  (LUTS) (600.21,599.69) 2. Stage I papillary adenocarcinoma of bladder (188.9) 3. Diverticulum, bladder acquired (596.3) 4. Chronic kidney disease, stage III (moderate) (585.3)  Multiple ( 4 ) renurrent bladder tumors, at bladder neck at 12:00; and at 5:00 inside the bladder neck; and on the rim of 2 Hutch diverticula. The Right u.o. is medial to the Right BT.   Plan Health Maintenance  1. UA With REFLEX; [Do Not Release]; Status:Resulted - Requires  Verification;   Done: [id number] 02:23PM Stage I papillary adenocarcinoma of bladder  2. Follow-up Schedule Surgery Office  Follow-up  Status: Complete  Done:  [id number] 8. Cysto; Status:Complete;   Done: [id number]  TUR multiple bladder tumors. He will need BCG again. He cannot have mitomycin because of TURP-whichhe needs because of symptoms, and because of the location of tumor at 12:00 and 5:00. ( R/o prostatic invasion.   Discussion/Summary cc: Jani Gravel, MD     Signatures Electronically signed by : Carolan Clines, M.D.; Aug 24 2013  5:17PM EST

## 2013-09-14 NOTE — Op Note (Signed)
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  Surgeon:  Chauncey Cruel. Gaynelle Arabian, MD  First assistant:  None  Anesthesia:  General LMA  Preparation:  After appropriate preanesthesia, the patient is brought to the operating room, placed in the upper table in the dorsal supine position where general LMA anesthesia was introduced. He was then replaced in the dorsal lithotomy position; and the  pubis was prepped with Betadine solution and draped in usual fashion.    Review history:  73 year old male referred by Dr. Davis Gourd for recurrent urinary tract infection. He has a 15 year history of microhematuria, and history of cigar smoking but none x10 years. Multiple TCC the bladder, now status post TUR bladder tumor and instillation of mitomycin in September 2014, with recurrence in January 2015. He has been treated with BCG induction and monthly treatments. He now have her has multiple recurrences within the bladder, occurring in the wall of a right-sided bladder diverticulum, and at the midline of the bladder neck, and growing on the right lateral wall of the prostate. His additional tumor is on the Left  the bladder base    Statement of  Likelihood of Success: Excellent. TIME-OUT  observed.:  Procedure:  Direct vision cystourethroscopy was accomplished, and showed normal  appearing pendulous urethra, and normal glans in uncircumcised penis. The prostatic urethra appeared obstructed from enlarged median lobe, and a 1.5cm pendulous TCC implant was seen growing out of the R lateral prostatic lobe. A second 2 cm tumor was identified in the midline on the inside of the bladder neck. The bladder itself had a trigone in normal position, but, bilateral Hutch-type diverticula were identified. The pt had been pre-treated with pyridium, in order to help identify the ureteral orifices, and the right orifice was identified on the median side rim of the Right diverticulum, with 2cm flowering tumor on the lateral edge of the diverticulum, with a  broad irregular base covering the Right side of the diverticular wall. On the Left side of the trigone, a 1.5 cm  Bladder tumor was identified and appeared to be superficial.    TUR-BT was accomplished of all tumors, and the prostate was resected in standard fashion, with chips sent to the laboratory for identification.  Photodocumentation was accomplished.    A 33F catheter, 30cc balloon was inflated with sterile water, and placed to CBI and traction. The patient was given antibiotic, IV Tylenol, and IV Toradol. He was awakened and taken to the PAR in good condition.

## 2013-09-14 NOTE — Anesthesia Preprocedure Evaluation (Addendum)
Anesthesia Evaluation  Patient identified by MRN, date of birth, ID band Patient awake    Reviewed: Allergy & Precautions, H&P , NPO status , Patient's Chart, lab work & pertinent test results  Airway Mallampati: II TM Distance: >3 FB Neck ROM: Full    Dental no notable dental hx.    Pulmonary former smoker,  breath sounds clear to auscultation  Pulmonary exam normal       Cardiovascular hypertension, Pt. on medications + Valvular Problems/Murmurs Rhythm:Regular Rate:Normal     Neuro/Psych negative neurological ROS  negative psych ROS   GI/Hepatic Neg liver ROS, GERD-  ,  Endo/Other  negative endocrine ROS  Renal/GU negative Renal ROS     Musculoskeletal negative musculoskeletal ROS (+)   Abdominal (+) + obese,   Peds  Hematology negative hematology ROS (+)   Anesthesia Other Findings   Reproductive/Obstetrics                          Anesthesia Physical Anesthesia Plan  ASA: II  Anesthesia Plan: General   Post-op Pain Management:    Induction: Intravenous  Airway Management Planned: LMA  Additional Equipment:   Intra-op Plan:   Post-operative Plan: Extubation in OR  Informed Consent: I have reviewed the patients History and Physical, chart, labs and discussed the procedure including the risks, benefits and alternatives for the proposed anesthesia with the patient or authorized representative who has indicated his/her understanding and acceptance.   Dental advisory given  Plan Discussed with: CRNA  Anesthesia Plan Comments:         Anesthesia Quick Evaluation

## 2013-09-15 ENCOUNTER — Encounter (HOSPITAL_COMMUNITY): Payer: Self-pay | Admitting: Urology

## 2013-09-15 MED ORDER — TRAMADOL-ACETAMINOPHEN 37.5-325 MG PO TABS: 1.0000 | ORAL_TABLET | Freq: Four times a day (QID) | ORAL | Status: DC | PRN

## 2013-09-15 MED ORDER — CIPROFLOXACIN HCL 500 MG PO TABS: 500.0000 mg | ORAL_TABLET | Freq: Two times a day (BID) | ORAL | Status: DC

## 2013-09-15 MED ORDER — PHENAZOPYRIDINE HCL 200 MG PO TABS: 200.0000 mg | ORAL_TABLET | Freq: Three times a day (TID) | ORAL | Status: DC | PRN

## 2013-09-15 NOTE — Progress Notes (Signed)
1 Day Post-Op Subjective: Patient reports clots in the urine. Irrigated. Pt will have CBI stopped and shower and see if he can be discharged.  Objective: Vital signs in last 24 hours: Temp:  [97.7 F (36.5 C)-98.4 F (36.9 C)] 98.4 F (36.9 C) (08/04 0454) Pulse Rate:  [70-77] 70 (08/04 0454) Resp:  [14-15] 15 (08/04 0454) BP: (112-118)/(58-65) 112/58 mmHg (08/04 0454) SpO2:  [95 %-97 %] 97 % (08/04 0831)  Intake/Output from previous day: 08/03 0701 - 08/04 0700 In: 20412.5 [I.V.:1287.5] Out: 22025 [Urine:22000; Blood:25] Intake/Output this shift: Total I/O In: 3641.7 [P.O.:240; I.V.:601.7; Other:2800] Out: 4360 [Urine:4360]  Physical Exam:  General:alert and cooperative GI: not done and soft, non tender, normal bowel sounds, no palpable masses, no organomegaly, no inguinal hernia Male genitalia: not done no bladder distension noted   Lab Results: No results found for this basename: HGB, HCT,  in the last 72 hours BMET No results found for this basename: NA, K, CL, CO2, GLUCOSE, BUN, CREATININE, CALCIUM,  in the last 72 hours No results found for this basename: LABPT, INR,  in the last 72 hours No results found for this basename: LABURIN,  in the last 72 hours Results for orders placed during the hospital encounter of 11/20/11  SURGICAL PCR SCREEN     Status: None   Collection Time    11/20/11  9:50 AM      Result Value Ref Range Status   MRSA, PCR NEGATIVE  NEGATIVE Final   Staphylococcus aureus NEGATIVE  NEGATIVE Final   Comment:            The Xpert SA Assay (FDA     approved for NASAL specimens     in patients over 48 years of age),     is one component of     a comprehensive surveillance     program.  Test performance has     been validated by [employer] for patients greater     than or equal to 28 year old.     It is not intended     to diagnose infection nor to     guide or monitor treatment.    Studies/Results: No results  found.  Assessment/Plan: Stable, but still haveing gross hematuria wit clots. Have spoken with case manager. Told that insurance will not cover pt unless he has complication of clot retention. ( ?) Will stop CBI and shower and see if can be discharged today.    LOS: 1 day   Zadin Lange I 09/15/2013, 1:13 PM

## 2013-09-15 NOTE — Discharge Instructions (Addendum)
Post transurethral resection of the prostate (TURP) instructions  Your recent prostate surgery requires very special post hospital care. Despite the fact that no skin incisions were used the area around the prostate incision is quite raw and is covered with a scab to promote healing and prevent bleeding. Certain cautions are needed to assure that the scab is not disturbed of the next 2-3 weeks while the healing proceeds.  Because the raw surface in your prostate and the irritating effects of urine you may expect frequency of urination and/or urgency (a stronger desire to urinate) and perhaps even getting up at night more often. This will usually resolve or improve slowly over the healing period. You may see some blood in your urine over the first 6 weeks. Do not be alarmed, even if the urine was clear for a while. Get off your feet and drink lots of fluids until clearing occurs. If you start to pass clots or don't improve call us.  Diet:  You may return to your normal diet immediately. Because of the raw surface of your bladder, alcohol, spicy foods, foods high in acid and drinks with caffeine may cause irritation or frequency and should be used in moderation. To keep your urine flowing freely and avoid constipation, drink plenty of fluids during the day (8-10 glasses). Tip: Avoid cranberry juice because it is very acidic.  Activity:  Your physical activity doesn't need to be restricted. However, if you are very active, you may see some blood in the urine. We suggest that you reduce your activity under the circumstances until the bleeding has stopped.  Bowels:  It is important to keep your bowels regular during the postoperative period. Straining with bowel movements can cause bleeding. A bowel movement every other day is reasonable. Use a mild laxative if needed, such as milk of magnesia 2-3 tablespoons, or 2 Dulcolax tablets. Call if you continue to have problems. If you had been taking narcotics  for pain, before, during or after your surgery, you may be constipated. Take a laxative if necessary.  Medication:  You should resume your pre-surgery medications unless told not to. In addition you may be given an antibiotic to prevent or treat infection. Antibiotics are not always necessary. All medication should be taken as prescribed until the bottles are finished unless you are having an unusual reaction to one of the drugs.     Problems you should report to [place]:  a. Fever greater than 101F. b. Heavy bleeding, or clots (see notes above about blood in urine). c. Inability to urinate. d. Drug reactions (hives, rash, nausea, vomiting, diarrhea). e. Severe burning or pain with urination that is not improving.  Bladder Cancer Bladder cancer is an abnormal growth of tissue in your bladder. Your bladder is the balloon-like sac in your pelvis. It collects and stores urine that comes from the kidneys through the ureters. The bladder wall is made of layers. If cancer spreads into these layers and through the wall of the bladder, it becomes more difficult to treat.  There are four stages of bladder cancer:  Stage I. Cancer at this stage occurs in the bladder's inner lining but has not invaded the muscular bladder wall.  Stage II. At this stage, cancer has invaded the bladder wall but is still confined to the bladder.  Stage III. By this stage, the cancer cells have spread through the bladder wall to surrounding tissue. They may also have spread to the prostate in men or the uterus or  vagina in women.  Stage IV. By this stage, cancer cells may have spread to the lymph nodes and other organs, such as your lungs, bones, or liver. RISK FACTORS Although the cause of bladder cancer is not known, the following risk factors can increase your chances of getting bladder cancer:   Smoking.   Occupational exposures, such as rubber, leather, textile, dyes, chemicals, and paint.  Being white.  Age.    Being male.   Having chronic bladder inflammation.   Having a bladder cancer history.   Having a family history of bladder cancer (heredity).   Having had chemotherapy or radiation therapy to the pelvis.   Being exposed to arsenic.  SYMPTOMS   Blood in the urine.   Pain with urination.   Frequent bladder or urine infections.  Increase in urgency and frequency of urination. DIAGNOSIS  Your health care provider may suspect bladder cancer based on your description of urinary symptoms or based on the finding of blood or infection in the urine (especially if this has recurred several times). Other tests or procedures that may be performed include:   A narrow tube being inserted into your bladder through your urethra (cystoscopy) in order to view the lining of your bladder for tumors.   A biopsy to sample the tumor to see if cancer is present.  If cancer is present, it will then be staged to determine its severity and extent. It is important to know how deeply into the bladder wall the cancer has grown and whether the cancer has spread to any other parts of your body. Staging may require blood tests or special scans such as a CT scan, MRI, bone scan, or chest X-ray.  TREATMENT  Once your cancer has been diagnosed and staged, you should discuss a treatment plan with your health care provider. Based on the stage of the cancer, one treatment or a combination of treatments may be recommended. The most common forms of treatment are:   Surgery. Procedures that may be done include transurethral resection and cystectomy.  Radiation therapy. This is infrequently used to treat bladder cancer.   Chemotherapy. During this treatment, drugs are used to kill cancer cells.  Immunotherapy. This is usually administered directly into the bladder. HOME CARE INSTRUCTIONS  Take medicines only as directed by your health care provider.   Maintain a healthy diet.   Consider joining a  support group. This may help you learn to cope with the stress of having bladder cancer.   Seek advice to help you manage treatment side effects.   Keep all follow-up visits as directed by your health care provider.   Inform your cancer specialist if you are admitted to the hospital.  [place] IF:  There is blood in your urine.  You have symptoms of a urinary tract infection. These include:  Tiredness.  Shakiness.  Weakness.  Muscle aches.  Abdominal pain.  Frequent and intense urge to urinate (in young women).  Burning feeling in the bladder or urethra during urination (in young women). SEEK IMMEDIATE MEDICAL CARE IF:  You are unable to urinate. Document Released: 02/01/2003 Document Revised: 06/15/2013 Document Reviewed: 07/22/2012 [facility] Patient Information 2015 [place], [place]. This information is not intended to replace advice given to you by your health care provider. Make sure you discuss any questions you have with your health care provider.

## 2013-09-15 NOTE — Discharge Summary (Addendum)
  Physician Discharge Summary  Patient ID: Jeremiah Wright MRN: [id number] DOB/AGE: [birth year] 73 y.o.  Admit date: 09/14/2013 Discharge date: 09/15/2013  Admission Diagnoses: BLADDER CANCER  Discharge Diagnoses:  Active Problems:   Bladder cancer BPH  Discharged Condition: Stable, with hematuria  Hospital Course: TURP, TURBT  Consults: none  Significant Diagnostic Studies: No results found.  Treatments: IV hydration and antibiotics:surgery  Discharge Exam: Blood pressure 112/58, pulse 70, temperature 98.4 F (36.9 C), temperature source Oral, resp. rate 15, height 5\' 6"  (1.676 m), weight 90.266 kg (199 lb), SpO2 97.00%. General appearance: alert and cooperative Male genitalia: normal  Disposition: Home, RTC for foley removal.     Medication List    ASK your doctor about these medications       finasteride 5 MG tablet  Commonly known as:  PROSCAR  Take 5 mg by mouth daily.     hydrocortisone cream 1 %  Apply 1 application topically 2 (two) times daily as needed for itching.     omeprazole 20 MG capsule  Commonly known as:  PRILOSEC  Take 20 mg by mouth every morning.     Oxycodone HCl 10 MG Tabs  Take 10 mg by mouth every 6 (six) hours as needed (Pain).     phenazopyridine 200 MG tablet  Commonly known as:  PYRIDIUM  Take 200 mg by mouth 3 (three) times daily as needed for pain.     tamsulosin 0.4 MG Caps capsule  Commonly known as:  FLOMAX  Take 1 capsule (0.4 mg total) by mouth daily.     traMADol 50 MG tablet  Commonly known as:  ULTRAM  Take 50-100 mg by mouth every 6 (six) hours as needed (Pain).     trimethoprim 100 MG tablet  Commonly known as:  TRIMPEX  Take 100 mg by mouth daily.     valsartan 320 MG tablet  Commonly known as:  DIOVAN  Take 160 mg by mouth every morning.     zolpidem 10 MG tablet  Commonly known as:  AMBIEN  Take 10 mg by mouth at bedtime as needed for sleep.           Follow-up Information   Follow up with Ailene Rud, MD.   Specialty:  Urology   Contact information:   [facility] [place] [address] [phone]       Signed: Carolan Clines I 09/15/2013, 1:18 PM  Addendum: Previous D/c cancelled because of urinary clot retention. Pt has now improved, and is given voiding trial with clearing urine. Allowed d/c. Scripts unchanged.   Pt's catheter removed and he is able to void with occasional small clot formation. He is allowed to be discharged. No riding lawnmower. Scripts unchanged.

## 2013-09-16 NOTE — Progress Notes (Signed)
Meets inpatient status-gross hematuria, mild CBI.

## 2013-09-16 NOTE — Progress Notes (Signed)
Urology Progress Note  2 Days Post-Op  Pt discharge yesterday, but cancelledk because of gross hematuria and clots. He continues to have gross hematuria today when CBI is off.  Subjective:     No acute urologic events overnight. Ambulation:   positive Flatus:    positive Bowel movement  positive  Pain: complete resolution  Objective:  Blood pressure 117/55, pulse 66, temperature 97.9 F (36.6 C), temperature source Oral, resp. rate 18, height 5\' 6"  (1.676 m), weight 90.266 kg (199 lb), SpO2 98.00%.  Physical Exam:  General:  No acute distress, awake Resp: clear to auscultation bilaterally Genitourinary:  normal Foley: yes.     I/O last 3 completed shifts: In: 33295.1 [P.O.:2280; I.V.:1568.3; Other:32800] Out: 88416 [Urine:44585]  No results found for this basename: HGB, WBC, PLT,  in the last 72 hours  No results found for this basename: NA, K, CL, CO2, BUN, CREATININE, CALCIUM, MAGNESIUM, GFRNONAA, GFRAA,  in the last 72 hours   No results found for this basename: PT, INR, APTT,  in the last 72 hours   No components found with this basename: ABG,   Assessment/Plan: TURP and TUR multiple BTs. Still grossly bloody urine, but no clots with mild CBI. Will try to D/c again tomorrow.

## 2013-09-17 ENCOUNTER — Other Ambulatory Visit: Payer: Self-pay | Admitting: Urology

## 2013-09-17 MED ORDER — TRAMADOL HCL 50 MG PO TABS
50.0000 mg | ORAL_TABLET | Freq: Four times a day (QID) | ORAL | Status: DC | PRN
  Administered 2013-09-17: 50 mg via ORAL
  Filled 2013-09-17: qty 1

## 2013-09-17 MED ORDER — TRAMADOL HCL 50 MG PO TABS: 50.0000 mg | ORAL_TABLET | Freq: Four times a day (QID) | ORAL | Status: DC

## 2013-09-17 NOTE — Progress Notes (Signed)
CBI d/c'd and catheter removed per Dr. Gaynelle Arabian. After patient has voided and urine is clear, can be d/c'd per Dr. Gaynelle Arabian. A few minutes after  catheter removed, patient passed a few clots through penis without discomfort. Will continue to monitor patient for void.

## 2013-09-17 NOTE — Progress Notes (Signed)
Spoke with urology PA who said to leave patient's catheter in, irrigate as needed, and ambulate patient. Stated she will follow up with patient later today.

## 2013-09-17 NOTE — Progress Notes (Signed)
Urology Progress Note  3 Days Post-Op  Urine clear this AM . No clots. Irrigated last night.  Subjective:     No acute urologic events overnight. Ambulation:   positive Flatus:    positive Bowel movement  positive  Pain: complete resolution  Objective:  Blood pressure 117/55, pulse 64, temperature 97.6 F (36.4 C), temperature source Oral, resp. rate 18, height 5\' 6"  (1.676 m), weight 90.266 kg (199 lb), SpO2 99.00%.  Physical Exam: ABD benign.   General:  No acute distress, awake  Genitourinary:   urine clear this AM. Pt post TURP and TUR multiple Bladder Tumors ( path pending). I think the catheter may be rubbing on his bladder and not allowing the TUR-BT areas to heal. Will D/c foley cath this AM and give voiding trial. ( hopefully D/c.  Foley: still in. Urine clear ( flushes with CBI)    I/O last 3 completed shifts: In: 55441.7 [P.O.:840; I.V.:601.7; Other:54000] Out: 33825 [Urine:62350]  No results found for this basename: HGB, WBC, PLT,  in the last 72 hours  No results found for this basename: NA, K, CL, CO2, BUN, CREATININE, CALCIUM, MAGNESIUM, GFRNONAA, GFRAA,  in the last 72 hours   No results found for this basename: PT, INR, APTT,  in the last 72 hours   No components found with this basename: ABG,   Assessment/Plan: Voiding trial.  Catheter removed.

## 2013-09-17 NOTE — Progress Notes (Signed)
Patient called, complaining of discomfort and bladder distention. Patient had not passed any more urine to catheter bag since insertion at 11:50 AM despite pushing PO fluids. Per patient, a large amount of urine had been leaked around the catheter and patient found some relief from that. Charge RN irrigated patient's catheter with approximately 60 cc NS, return of many moderate sized clots. Urine now dark pink and clear. Patient feels full relief. Urologist on call for Dr. Gaynelle Arabian paged. Will continue to monitor patient.

## 2013-09-17 NOTE — Progress Notes (Addendum)
Per Izora Ribas, [place], patient to stay one more night and hand irrigation to be done PRN. Izora Ribas, PA to notify Dr. Gaynelle Arabian that patient will not be discharged today. Patient notified, will continue to monitor.

## 2013-09-17 NOTE — Progress Notes (Signed)
Patient called out complaining of pain from catheter. Irrigated foley and removed several clots. Foley draining fine for now and patient's pain is relieved.  Will continue to monitor.

## 2013-09-17 NOTE — Progress Notes (Signed)
Came into patient's room to find that patient was having discomfort and that some urine and blood had leaked around his catheter. Irrigated catheter with approximately 150 cc NS with return of many moderate sized clots. Irrigated until no return of clots, patient felt relief. Will continue to monitor.

## 2013-09-17 NOTE — Progress Notes (Signed)
Received call from [place], [place] who got in contact with Dr. Gaynelle Arabian regarding patient. Patient to stay the night with PRN hand irrigation and if bleeding continues, will possibly go to OR in AM. Patient informed and in agreement with plan. Will continue to monitor.

## 2013-09-17 NOTE — Progress Notes (Signed)
Since catheter removed at 10 AM, patient has passed 20 cc of blood and clots x 2. Notified Dr. Gaynelle Arabian who said to place an 21 F catheter in patient and send patient home with catheter and for patient to arrange follow up appointment next week. Will continue to monitor patient.

## 2013-09-18 ENCOUNTER — Encounter (HOSPITAL_COMMUNITY)
Admission: RE | Disposition: A | Discharge status: Home / Self Care | Payer: Self-pay | Source: Ambulatory Visit | Attending: Urology

## 2013-09-18 SURGERY — CYSTOSCOPY
Anesthesia: General

## 2013-09-18 MED ORDER — SODIUM CHLORIDE 0.45 % IV SOLN: INTRAVENOUS | Status: DC

## 2013-09-18 MED ORDER — SODIUM CHLORIDE 0.45 % IV BOLUS
250.0000 mL | Freq: Once | INTRAVENOUS | Status: AC
  Administered 2013-09-18: 250 mL via INTRAVENOUS

## 2013-09-18 NOTE — Progress Notes (Signed)
Patient able to urinate 550 cc but urine had large clots. Bladder scanned patient and found no residual urine. Notified Tannenbaum, MD who said to hydrate patient with 250 cc 1/2 normal saline bolus and run fluids. Gaynelle Arabian, MD evaluated patient and patient's urine and said that if patient able to urinate again, could change patient to regular diet so he could eat lunch. Patient able to urinate about 125 cc around 20 minutes later with no residual urine. Message left for Dr. Gaynelle Arabian to call RN. Will continue to monitor patient.

## 2013-09-18 NOTE — Progress Notes (Signed)
Patient had good night last night with no irrigation required. This AM, Dr. Gaynelle Arabian requested that patient be irrigated once to ensure that catheter is irrigated easily and to then fill patient's bladder with 200-300 cc NS, remove catheter,and allow patient to try to urinate. Catheter irrigated easily with 100 cc NS, and then bladder filled with approx 280 cc NS, and catheter removed. Will continue to monitor patient for void.

## 2013-12-01 ENCOUNTER — Other Ambulatory Visit: Payer: Self-pay | Admitting: Urology

## 2013-12-02 ENCOUNTER — Other Ambulatory Visit (HOSPITAL_COMMUNITY): Payer: Self-pay | Admitting: Urology

## 2013-12-02 ENCOUNTER — Ambulatory Visit (HOSPITAL_COMMUNITY)
Admission: RE | Admit: 2013-12-02 | Discharge: 2013-12-02 | Disposition: A | Discharge status: Home / Self Care | Payer: Medicare Other | Source: Ambulatory Visit | Attending: Urology | Admitting: Urology

## 2013-12-02 DIAGNOSIS — C679 Malignant neoplasm of bladder, unspecified: Secondary | ICD-10-CM | POA: Diagnosis not present

## 2013-12-22 ENCOUNTER — Encounter (HOSPITAL_COMMUNITY)
Admission: RE | Admit: 2013-12-22 | Discharge: 2013-12-22 | Disposition: A | Discharge status: Home / Self Care | Payer: Medicare Other | Source: Ambulatory Visit | Attending: Urology | Admitting: Urology

## 2013-12-22 ENCOUNTER — Encounter (HOSPITAL_COMMUNITY): Payer: Self-pay

## 2013-12-22 DIAGNOSIS — Z01818 Encounter for other preprocedural examination: Secondary | ICD-10-CM | POA: Diagnosis not present

## 2013-12-22 LAB — CBC
HEMATOCRIT: 42.7 % (ref 39.0–52.0)
HEMOGLOBIN: 14 g/dL (ref 13.0–17.0)
MCH: 29.6 pg (ref 26.0–34.0)
MCHC: 32.8 g/dL (ref 30.0–36.0)
MCV: 90.3 fL (ref 78.0–100.0)
Platelets: 267 10*3/uL (ref 150–400)
RBC: 4.73 MIL/uL (ref 4.22–5.81)
RDW: 11.6 % (ref 11.5–15.5)
WBC: 6.4 10*3/uL (ref 4.0–10.5)

## 2013-12-22 LAB — BASIC METABOLIC PANEL
Anion gap: 13 (ref 5–15)
BUN: 16 mg/dL (ref 6–23)
CHLORIDE: 103 meq/L (ref 96–112)
CO2: 25 mEq/L (ref 19–32)
Calcium: 9.5 mg/dL (ref 8.4–10.5)
Creatinine, Ser: 1.19 mg/dL (ref 0.50–1.35)
GFR, EST AFRICAN AMERICAN: 69 mL/min — AB (ref >90)
GFR, EST NON AFRICAN AMERICAN: 59 mL/min — AB (ref >90)
GLUCOSE: 113 mg/dL — AB (ref 70–99)
Potassium: 4.5 mEq/L (ref 3.7–5.3)
Sodium: 141 mEq/L (ref 137–147)

## 2013-12-22 NOTE — Consult Note (Signed)
[place] ostomy consult note Patient marked per Dr. Zettie Pho request for ileal conduit.  Abdomen assessed in the sitting and standing positions.  Site is marked within the abdominal rectus muscle.  Patient with slightly rotund abdomen, wears his pants well below the mark. Site selected is in the Right upper quadrant, 7cm above the umbilicus and 5cm to the right of the umbilicus. Elta [place] is made with a skin marking pen and mark is covered with a thin film transparent dressing. Education provided: Patient declines reading material regarding an ostomy, but takes it home for his wife.  Has worn an indwelling urinary catheter intermittently in the past and is somewhat familiar with bedside drainage bags. Understands nature of diversion and is able to articulate the role of the [place] Nurse in the post-operative period. [facility] nursing team will look forward to caring for this nice man postoperatively, and will remain available to this patient, the nursing and medical teams.   Thanks, Maudie Flakes, MSN, RN, [place], [place], [place] [phone])

## 2013-12-22 NOTE — Progress Notes (Signed)
Chest x-ray 12/02/13 on EPIC, EKG 09/09/13 on EPIC

## 2013-12-22 NOTE — Patient Instructions (Addendum)
[place]  12/22/2013   Your procedure is scheduled on: Wednesday 12/30/13  Report to [facility] Admitting Tuesday 12/29/13  Call this number if you have problems the morning of surgery [phone]: [phone]   Remember: Please follow bowel prep instructions   Clear liquids all day Tuesday 12/29/13. Do not drink liquids After Midnight.     Take these medicines the morning of surgery with A SIP OF WATER: omeprazole, trimethoprim   Do not wear jewelry, make-up or nail polish.  Do not wear lotions, powders, or perfumes.  Do not shave 48 hours prior to surgery. Men may shave face and neck.  Do not bring valuables to the hospital.  Contacts, dentures or bridgework may not be worn into surgery.  Leave suitcase in the car. After surgery it may be brought to your room.  For patients admitted to the hospital, checkout time is 11:00 AM the day of discharge.   [facility] - Preparing for Surgery Before surgery, you can play an important role.  Because skin is not sterile, your skin needs to be as free of germs as possible.  You can reduce the number of germs on your skin by washing with CHG (chlorahexidine gluconate) soap before surgery.  CHG is an antiseptic cleaner which kills germs and bonds with the skin to continue killing germs even after washing. Please DO NOT use if you have an allergy to CHG or antibacterial soaps.  If your skin becomes reddened/irritated stop using the CHG and inform your nurse when you arrive at Short Stay. Do not shave (including legs and underarms) for at least 48 hours prior to the first CHG shower.  You may shave your face/neck. Please follow these instructions carefully:  1.  Shower with CHG Soap the night before surgery and the  morning of Surgery.  2.  If you choose to wash your hair, wash your hair first as usual with your  normal  shampoo.  3.  After you shampoo, rinse your hair and body thoroughly to remove the  shampoo.                            4.  Use CHG  as you would any other liquid soap.  You can apply chg directly  to the skin and wash                       Gently with a scrungie or clean washcloth.  5.  Apply the CHG Soap to your body ONLY FROM THE NECK DOWN.   Do not use on face/ open                           Wound or open sores. Avoid contact with eyes, ears mouth and genitals (private parts).                       Wash face,  Genitals (private parts) with your normal soap.             6.  Wash thoroughly, paying special attention to the area where your surgery  will be performed.  7.  Thoroughly rinse your body with warm water from the neck down.  8.  DO NOT shower/wash with your normal soap after using and rinsing off  the CHG Soap.  9.  Pat yourself dry with a clean towel.            10.  Wear clean pajamas.            11.  Place clean sheets on your bed the night of your first shower and do not  sleep with pets. Day of Surgery : Do not apply any lotions/deodorants the morning of surgery.  Please wear clean clothes to the hospital/surgery center.  FAILURE TO FOLLOW THESE INSTRUCTIONS MAY RESULT IN THE CANCELLATION OF YOUR SURGERY PATIENT SIGNATURE_________________________________  NURSE SIGNATURE__________________________________  ________________________________________________________________________    CLEAR LIQUID DIET   Foods Allowed                                                                     Foods Excluded  Coffee and tea, regular and decaf                             liquids that you cannot  Plain Jell-O in any flavor                                             see through such as: Fruit ices (not with fruit pulp)                                     milk, soups, orange juice  Iced Popsicles                                    All solid food Carbonated beverages, regular and diet                                    Cranberry, grape and apple juices Sports drinks like Gatorade Lightly seasoned  clear broth or consume(fat free) Sugar, honey syrup  Sample Menu Breakfast                                Lunch                                     Supper Cranberry juice                    Beef broth                            Chicken broth Jell-O                                     Grape juice  Apple juice Coffee or tea                        Jell-O                                      Popsicle                                                Coffee or tea                        Coffee or tea  _____________________________________________________________________

## 2013-12-22 NOTE — Progress Notes (Signed)
Pre-op testing and labs drawn at pre-op visit on 12/22/13

## 2013-12-29 ENCOUNTER — Inpatient Hospital Stay (HOSPITAL_COMMUNITY)
Admission: RE | Admit: 2013-12-29 | Discharge: 2014-01-06 | DRG: 654 | Disposition: A | Discharge status: Home Health | Payer: Medicare Other | Source: Ambulatory Visit | Attending: Urology | Admitting: Urology

## 2013-12-29 ENCOUNTER — Encounter (HOSPITAL_COMMUNITY): Payer: Self-pay | Admitting: *Deleted

## 2013-12-29 DIAGNOSIS — Z466 Encounter for fitting and adjustment of urinary device: Secondary | ICD-10-CM

## 2013-12-29 DIAGNOSIS — Z79899 Other long term (current) drug therapy: Secondary | ICD-10-CM

## 2013-12-29 DIAGNOSIS — I10 Essential (primary) hypertension: Secondary | ICD-10-CM | POA: Diagnosis present

## 2013-12-29 DIAGNOSIS — K219 Gastro-esophageal reflux disease without esophagitis: Secondary | ICD-10-CM | POA: Diagnosis present

## 2013-12-29 DIAGNOSIS — Z96612 Presence of left artificial shoulder joint: Secondary | ICD-10-CM | POA: Diagnosis present

## 2013-12-29 DIAGNOSIS — C679 Malignant neoplasm of bladder, unspecified: Secondary | ICD-10-CM | POA: Diagnosis present

## 2013-12-29 DIAGNOSIS — K567 Ileus, unspecified: Secondary | ICD-10-CM | POA: Diagnosis not present

## 2013-12-29 DIAGNOSIS — N329 Bladder disorder, unspecified: Secondary | ICD-10-CM | POA: Diagnosis present

## 2013-12-29 DIAGNOSIS — C61 Malignant neoplasm of prostate: Secondary | ICD-10-CM | POA: Diagnosis present

## 2013-12-29 DIAGNOSIS — M1990 Unspecified osteoarthritis, unspecified site: Secondary | ICD-10-CM | POA: Diagnosis present

## 2013-12-29 DIAGNOSIS — Z87891 Personal history of nicotine dependence: Secondary | ICD-10-CM | POA: Diagnosis not present

## 2013-12-29 LAB — COMPREHENSIVE METABOLIC PANEL
ALBUMIN: 3.8 g/dL (ref 3.5–5.2)
ALT: 16 U/L (ref 0–53)
AST: 23 U/L (ref 0–37)
Alkaline Phosphatase: 91 U/L (ref 39–117)
Anion gap: 12 (ref 5–15)
BILIRUBIN TOTAL: 0.3 mg/dL (ref 0.3–1.2)
BUN: 17 mg/dL (ref 6–23)
CHLORIDE: 107 meq/L (ref 96–112)
CO2: 24 mEq/L (ref 19–32)
Calcium: 9 mg/dL (ref 8.4–10.5)
Creatinine, Ser: 1.2 mg/dL (ref 0.50–1.35)
GFR calc Af Amer: 68 mL/min — ABNORMAL LOW (ref >90)
GFR calc non Af Amer: 59 mL/min — ABNORMAL LOW (ref >90)
Glucose, Bld: 142 mg/dL — ABNORMAL HIGH (ref 70–99)
Potassium: 4.7 mEq/L (ref 3.7–5.3)
Sodium: 143 mEq/L (ref 137–147)
Total Protein: 7.7 g/dL (ref 6.0–8.3)

## 2013-12-29 LAB — TYPE AND SCREEN
ABO/RH(D): A NEG
Antibody Screen: NEGATIVE

## 2013-12-29 LAB — CBC
HEMATOCRIT: 39.8 % (ref 39.0–52.0)
Hemoglobin: 13.2 g/dL (ref 13.0–17.0)
MCH: 30.1 pg (ref 26.0–34.0)
MCHC: 33.2 g/dL (ref 30.0–36.0)
MCV: 90.7 fL (ref 78.0–100.0)
Platelets: 243 10*3/uL (ref 150–400)
RBC: 4.39 MIL/uL (ref 4.22–5.81)
RDW: 11.8 % (ref 11.5–15.5)
WBC: 6.2 10*3/uL (ref 4.0–10.5)

## 2013-12-29 LAB — PREPARE RBC (CROSSMATCH)

## 2013-12-29 MED ORDER — PEG 3350-KCL-NA BICARB-NACL 420 G PO SOLR
4000.0000 mL | Freq: Once | ORAL | Status: AC
  Administered 2013-12-29: 4000 mL via ORAL

## 2013-12-29 MED ORDER — TRAMADOL HCL 50 MG PO TABS
50.0000 mg | ORAL_TABLET | Freq: Four times a day (QID) | ORAL | Status: DC | PRN
  Administered 2013-12-29 (×2): 50 mg via ORAL
  Filled 2013-12-29 (×2): qty 1

## 2013-12-29 MED ORDER — KCL IN DEXTROSE-NACL 20-5-0.45 MEQ/L-%-% IV SOLN
INTRAVENOUS | Status: DC
  Administered 2013-12-29 – 2013-12-30 (×2): via INTRAVENOUS
  Filled 2013-12-29 (×2): qty 1000

## 2013-12-29 MED ORDER — IRBESARTAN 150 MG PO TABS
150.0000 mg | ORAL_TABLET | Freq: Every day | ORAL | Status: DC
  Administered 2013-12-29: 150 mg via ORAL
  Filled 2013-12-29 (×2): qty 1

## 2013-12-29 MED ORDER — ALVIMOPAN 12 MG PO CAPS: 12.0000 mg | ORAL_CAPSULE | Freq: Once | ORAL | Status: DC

## 2013-12-29 MED ORDER — ZOLPIDEM TARTRATE 5 MG PO TABS
5.0000 mg | ORAL_TABLET | Freq: Every evening | ORAL | Status: DC | PRN
  Administered 2013-12-29: 5 mg via ORAL
  Filled 2013-12-29: qty 1

## 2013-12-29 NOTE — H&P (Signed)
Jeremiah Wright is an 73 y.o. male.    Chief Complaint: Pre-op Cystectomy  HPI:   1 - Recurrent High Grade Bladder Cancer -  Recent Course: 02/2012 - TURBT TaG1; 10/2012 - TURBT TaG1 (recurrent), Staging CT clinically localized 02/2013 - TURBT T1G3 --> Induction BCG x 6 09/2013 - TURBT T1G3 AND Gleason 7 prostate cancer  2 - Moderate Risk Prostate Cancer - Gleason 7 prostate cancer by TURP chips 09/2013. PSA <1.   PMH sig for HTN, ortho surgery. No CV disease. No strong blood thinners. His PCP is Dr. Maudie Mercury.   Today Jeremiah Wright is seen as preadmission for bowel prep and stomal marking in preparation for robotic cystoprostatectomy tomorrow.  Past Medical History  Diagnosis Date  . Hypertension   . History of diverticulitis of colon   . Insomnia   . Heart murmur     PT TOLD SLIGHT HEART MURMUR - DOESN'T CAUSE ANY PROBLEMS  . Arthritis     SHOULDERS AND KNEES - ARTHRITIS AND PAIN  . GERD (gastroesophageal reflux disease)     under control  . Bladder cancer     "[name]"     Past Surgical History  Procedure Laterality Date  . Knee arthroscopy Bilateral >10 YRS AGO  . Fatty tumor      REMOVED FROM BACK  . Secondary closure arm      RT ARM INJURY  . Reverse shoulder arthroplasty  11/29/2011    Procedure: REVERSE SHOULDER ARTHROPLASTY;  Surgeon: Nita Sells, MD;  Location: [facility];  Service: Orthopedics;  Laterality: Left;  Left Reverse Total Shoulder Arthroplasty with left supra clavicular block  . Cardiovascular stress test  10-14-2008  DR BERRY    NORMAL PERFUSION STUDY/ NO ISCHEMIA/ EF 61%  . Transthoracic echocardiogram  09-07-2008   DR BERRY    LVEF 55-65%/ BORDERLINE LV HYPERTROPHY  . Shoulder open rotator cuff repair  07-18-2001    AND PARTIAL ACROMIONECTOMY/ ACROMIOPLASTY ,  RIGHT SHOULDER  . Open acromionectomy/ resection distal clavicle/ repair rotator cuff  03-15-2009    LEFT SHOULDER  . Cystoscopy with biopsy Right 04/17/2012    Procedure: CYSTOSCOPY WITH  BIOPSY  OF RIGHT BLADDER DIVERTICULUM TUMOR BUTTON VAPORIZATION OF TUMOR INSIDE DIVERTICULUM;  Surgeon: Ailene Rud, MD;  Location[facility];  Service: Urology;  Laterality: Right;  CYSTOSCOPY WITH COLD CUP BIOPSY OF RIGHT BLADDER DIVERTICULUM TUMOR/HOLMIUM LASER OF BLADDER TUMOR/POSSIBLE BUTTON VAPORIZATION OF TUMOR INSIDE DIVERTICULUM  . Cystoscopy w/ retrogrades Right 04/17/2012    Procedure: CYSTOSCOPY WITH RETROGRADE PYELOGRAM;  Surgeon: Ailene Rud, MD;  Location: [facility];  Service: Urology;  Laterality: Right;  . Transurethral resection of bladder tumor with mitomycin-c N/A 11/07/2012    Procedure: TRANSURETHRAL RESECTION OF BLADDER TUMOR WITH MITOMYCIN-C;  Surgeon: Ailene Rud, MD;  Location: [facility];  Service: Urology;  Laterality: N/A;  . Cystoscopy with biopsy N/A 11/07/2012    Procedure: CYSTOSCOPY WITH COLD CUP BIOPSY AND FULGERATION;  Surgeon: Ailene Rud, MD;  Location[facility];  Service: Urology;  Laterality: N/A;  . Cystoscopy N/A 03/02/2013    Procedure: CYSTOSCOPY WITH INSTILLATION OF MITOMYCIN C;  Surgeon: Ailene Rud, MD;  Location: [facility];  Service: Urology;  Laterality: N/A;  . Transurethral resection of bladder tumor N/A 03/02/2013    Procedure: TRANSURETHRAL RESECTION OF BLADDER TUMOR (TURBT);  Surgeon: Ailene Rud, MD;  Location: [facility];  Service: Urology;  Laterality:  N/A;  . Cystoscopy with retrograde pyelogram, ureteroscopy and stent placement Bilateral 09/14/2013    Procedure: CYSTO BILATERAL RETROGRADE PYELOGRAM;  Surgeon: Ailene Rud, MD;  Location: [facility];  Service: Urology;  Laterality: Bilateral;  . Transurethral resection of bladder tumor N/A 09/14/2013    Procedure: TRANSURETHRAL RESECTION OF BLADDER TUMOR (TURBT)/TURP WITH GYRUS;  Surgeon: Ailene Rud, MD;  Location: [facility];  Service: Urology;   Laterality: N/A;  . Tonsillectomy  as child    History reviewed. No pertinent family history. Social History:  reports that he quit smoking about 10 years ago. His smoking use included Cigars. He has never used smokeless tobacco. He reports that he does not drink alcohol or use illicit drugs.  Allergies: No Known Allergies  Medications Prior to Admission  Medication Sig Dispense Refill  . finasteride (PROSCAR) 5 MG tablet Take 5 mg by mouth every morning.     Marland Kitchen omeprazole (PRILOSEC) 20 MG capsule Take 20 mg by mouth every morning.     . Oxycodone HCl 10 MG TABS Take 10 mg by mouth every 6 (six) hours as needed (Pain).    . tamsulosin (FLOMAX) 0.4 MG CAPS Take 1 capsule (0.4 mg total) by mouth daily. (Patient taking differently: Take 0.4 mg by mouth every morning. ) 30 capsule 0  . traMADol (ULTRAM) 50 MG tablet Take 50 mg by mouth every 6 (six) hours as needed for moderate pain.     Marland Kitchen trimethoprim (TRIMPEX) 100 MG tablet Take 100 mg by mouth every morning.     . valsartan (DIOVAN) 320 MG tablet Take 160 mg by mouth every morning.    . zolpidem (AMBIEN) 10 MG tablet Take 5-10 mg by mouth at bedtime as needed for sleep.     . ciprofloxacin (CIPRO) 500 MG tablet Take 1 tablet (500 mg total) by mouth 2 (two) times daily. 10 tablet 0  . hydrocortisone cream 1 % Apply 1 application topically 2 (two) times daily as needed for itching.    . phenazopyridine (PYRIDIUM) 200 MG tablet Take 200 mg by mouth 3 (three) times daily as needed for pain.    . phenazopyridine (PYRIDIUM) 200 MG tablet Take 1 tablet (200 mg total) by mouth 3 (three) times daily as needed for pain. For urinary burning Note: will stain clothing 30 tablet 3  . traMADol-acetaminophen (ULTRACET) 37.5-325 MG per tablet Take 1 tablet by mouth every 6 (six) hours as needed. 30 tablet 2    No results found for this or any previous visit (from the past 48 hour(s)). No results found.  Review of Systems  Constitutional: Negative.   Negative for fever and chills.  HENT: Negative.   Eyes: Negative.   Respiratory: Negative.   Cardiovascular: Negative.   Gastrointestinal: Negative.   Genitourinary: Negative.  Negative for flank pain.  Musculoskeletal: Positive for joint pain.       Chronic bilateral shoulder pain  Skin: Negative.   Neurological: Negative.   Endo/Heme/Allergies: Negative.   Psychiatric/Behavioral: Negative.     Blood pressure 173/87, pulse 72, temperature 98.1 F (36.7 C), temperature source Oral, resp. rate 16, height 5\' 6"  (1.676 m), weight 91.128 kg (200 lb 14.4 oz), SpO2 100 %. Physical Exam  Constitutional: He appears well-developed and well-nourished.  HENT:  Head: Normocephalic.  Eyes: Pupils are equal, round, and reactive to light.  Neck: Normal range of motion. Neck supple.  Cardiovascular: Normal rate.   Respiratory: Effort normal.  GI: Soft.  RLQ urostomy site marked  Genitourinary: Penis normal.  Musculoskeletal: Normal range of motion.  Neurological: He is alert.  Skin: Skin is warm and dry.  Psychiatric: He has a normal mood and affect. His behavior is normal. Judgment and thought content normal.     Assessment/Plan  1 - Recurrent High Grade Bladder Cancer - Aggressive disease with significant grade progression and rapid recurrences despite TURBT x several, Mito-C, and full induction BCG. Agree cystoprostatectomy offers best chance of cure and would also provide excellent primary therapy for prostate adenocarcinoma as well.   We rediscussed the role of radical cystectomy + lymph node dissection with concomitant prostatectomy in male and hysterectomy / oophorectomy in male and ileal conduit urinary diversion with the overall goal of complete surgical excision (negative margins) and better staging / diagnosis. We specifically rediscussed alternatives including chemo-radiation, palliative therapies, and the role of neoadjuvant chemotherapy. We then rediscussed surgical approaches  including robotic and open techniques with robotic associated with a shorter convalescence. I showed the patient on their abdomen the approximately 4-6 incision (trocar) sites as well as presumed extraction sites with robotic approach as well as possible open incision sites. I also showed them potential sites for the ileal conduit and spent significant time explaining the "plumbing" of this with regards to GI and GU tracts and specific risks of diversion including ureteral stricture. We specifically readdressed that there may be need to alter operative plans according to intraopertive findings including conversion to open procedure. We rediscussed specific peri-operative risks including bleeding, infection, deep vein thrombosis, pulmonary embolism, compartment syndrome, nuropathy / neuropraxia, bowel leak, bowel stricture, heart attack, stroke, death, as well as long-term risks such as non-cure / need for additional therapy and need for imaging and lab based post-op surveillance protocols. We rediscussed typical hospital course of approximately 5-7 day hospitalization, need for peri-operative drains / catheters, and typical post-hospital course with return to most non-strenuous activities by 4 weeks and ability to return to most jobs and more strenuous activity such as exercise by 8 weeks but with complete return to baseline often taking 77mos plus.  2 - Moderate Risk Prostate Cancer - although pt over 71, he has good performance status and relatively little comorbidity, agree plan above warranted for this indication as well.    Farhana Fellows 12/29/2013, 11:09 AM

## 2013-12-29 NOTE — Anesthesia Preprocedure Evaluation (Addendum)
Anesthesia Evaluation  Patient identified by MRN, date of birth, ID band Patient awake    Reviewed: Allergy & Precautions, H&P , NPO status , Patient's Chart, lab work & pertinent test results  Airway Mallampati: II  TM Distance: >3 FB Neck ROM: Full    Dental no notable dental hx. (+) Dental Advisory Given   Pulmonary former smoker,  breath sounds clear to auscultation  Pulmonary exam normal       Cardiovascular hypertension, Pt. on medications + Valvular Problems/Murmurs Rhythm:Regular Rate:Normal     Neuro/Psych negative neurological ROS  negative psych ROS   GI/Hepatic Neg liver ROS, GERD-  Medicated and Controlled,  Endo/Other  negative endocrine ROS  Renal/GU negative Renal ROS Bladder dysfunction  Bladder cancer    Musculoskeletal  (+) Arthritis -, Osteoarthritis,    Abdominal   Peds negative pediatric ROS (+)  Hematology negative hematology ROS (+)   Anesthesia Other Findings   Reproductive/Obstetrics negative OB ROS                            Anesthesia Physical Anesthesia Plan  ASA: III  Anesthesia Plan: General   Post-op Pain Management:    Induction: Intravenous  Airway Management Planned: Oral ETT  Additional Equipment: Arterial line, CVP and Ultrasound Guidance Line Placement  Intra-op Plan:   Post-operative Plan: Extubation in OR and Possible Post-op intubation/ventilation  Informed Consent: I have reviewed the patients History and Physical, chart, labs and discussed the procedure including the risks, benefits and alternatives for the proposed anesthesia with the patient or authorized representative who has indicated his/her understanding and acceptance.   Dental advisory given  Plan Discussed with: CRNA  Anesthesia Plan Comments:        Anesthesia Quick Evaluation

## 2013-12-29 NOTE — Consult Note (Signed)
WOC ostomy consult note Stoma type/location: Pre surgical marking for ileal conduit.  Previously marked on 12/22/13.  Site is still present and continues to be ideal location.  7 cm above umbilicus and 5 cm right of umbilicus.   Patient had additional questions regarding the site being above the umbilicus. Had patient stand, sit and change positioning.  Explained that this location left a flat plane for pouching and was easily within his own field of vision.  Patient verbalized understanding and understood rationale.   Education provided:  States he feels like he understands what to expect after surgery and is appreciative of [place] services' continued care.  [facility] team will continue to follow.  Domenic Moras RN BSN [place] Pager [phone]

## 2013-12-30 ENCOUNTER — Inpatient Hospital Stay (HOSPITAL_COMMUNITY): Payer: Medicare Other | Admitting: Anesthesiology

## 2013-12-30 ENCOUNTER — Encounter (HOSPITAL_COMMUNITY)
Admission: RE | Disposition: A | Discharge status: Home Health | Payer: Self-pay | Source: Ambulatory Visit | Attending: Urology

## 2013-12-30 ENCOUNTER — Encounter (HOSPITAL_COMMUNITY): Payer: Self-pay | Admitting: Certified Registered"

## 2013-12-30 ENCOUNTER — Inpatient Hospital Stay (HOSPITAL_COMMUNITY): Payer: Medicare Other

## 2013-12-30 HISTORY — PX: CYSTOSCOPY: SHX5120

## 2013-12-30 HISTORY — PX: ROBOT ASSISTED LAPAROSCOPIC COMPLETE CYSTECT ILEAL CONDUIT: SHX5139

## 2013-12-30 LAB — CBC WITH DIFFERENTIAL/PLATELET
Basophils Absolute: 0 10*3/uL (ref 0.0–0.1)
Basophils Relative: 0 % (ref 0–1)
EOS PCT: 0 % (ref 0–5)
Eosinophils Absolute: 0 10*3/uL (ref 0.0–0.7)
HEMATOCRIT: 41.3 % (ref 39.0–52.0)
Hemoglobin: 14 g/dL (ref 13.0–17.0)
LYMPHS ABS: 0.5 10*3/uL — AB (ref 0.7–4.0)
Lymphocytes Relative: 3 % — ABNORMAL LOW (ref 12–46)
MCH: 30 pg (ref 26.0–34.0)
MCHC: 33.9 g/dL (ref 30.0–36.0)
MCV: 88.6 fL (ref 78.0–100.0)
Monocytes Absolute: 0.6 10*3/uL (ref 0.1–1.0)
Monocytes Relative: 4 % (ref 3–12)
NEUTROS ABS: 13.7 10*3/uL — AB (ref 1.7–7.7)
Neutrophils Relative %: 93 % — ABNORMAL HIGH (ref 43–77)
Platelets: 293 10*3/uL (ref 150–400)
RBC: 4.66 MIL/uL (ref 4.22–5.81)
RDW: 11.7 % (ref 11.5–15.5)
WBC: 14.7 10*3/uL — AB (ref 4.0–10.5)

## 2013-12-30 LAB — BASIC METABOLIC PANEL
ANION GAP: 12 (ref 5–15)
BUN: 12 mg/dL (ref 6–23)
CO2: 22 meq/L (ref 19–32)
Calcium: 8.7 mg/dL (ref 8.4–10.5)
Chloride: 104 mEq/L (ref 96–112)
Creatinine, Ser: 1.34 mg/dL (ref 0.50–1.35)
GFR calc Af Amer: 59 mL/min — ABNORMAL LOW (ref >90)
GFR calc non Af Amer: 51 mL/min — ABNORMAL LOW (ref >90)
GLUCOSE: 157 mg/dL — AB (ref 70–99)
POTASSIUM: 4.3 meq/L (ref 3.7–5.3)
SODIUM: 138 meq/L (ref 137–147)

## 2013-12-30 LAB — SURGICAL PCR SCREEN
MRSA, PCR: NEGATIVE
Staphylococcus aureus: NEGATIVE

## 2013-12-30 SURGERY — CYSTECTOMY, ROBOT-ASSISTED, WITH ILEAL CONDUIT CREATION
Anesthesia: General

## 2013-12-30 MED ORDER — ACETAMINOPHEN 10 MG/ML IV SOLN
INTRAVENOUS | Status: DC | PRN
  Administered 2013-12-30: 1000 mg via INTRAVENOUS

## 2013-12-30 MED ORDER — LACTATED RINGERS IV SOLN: INTRAVENOUS | Status: DC

## 2013-12-30 MED ORDER — HYDROMORPHONE 0.3 MG/ML IV SOLN
INTRAVENOUS | Status: AC
  Filled 2013-12-30: qty 25

## 2013-12-30 MED ORDER — SODIUM CHLORIDE 0.9 % IJ SOLN: 9.0000 mL | INTRAMUSCULAR | Status: DC | PRN

## 2013-12-30 MED ORDER — ONDANSETRON HCL 4 MG/2ML IJ SOLN: 4.0000 mg | Freq: Once | INTRAMUSCULAR | Status: AC | PRN

## 2013-12-30 MED ORDER — HYDROMORPHONE HCL 1 MG/ML IJ SOLN
INTRAMUSCULAR | Status: AC
  Administered 2013-12-30: 0.5 mg via INTRAVENOUS
  Filled 2013-12-30: qty 1

## 2013-12-30 MED ORDER — DIPHENHYDRAMINE HCL 12.5 MG/5ML PO ELIX
12.5000 mg | ORAL_SOLUTION | Freq: Four times a day (QID) | ORAL | Status: DC | PRN
  Filled 2013-12-30: qty 5

## 2013-12-30 MED ORDER — NEOSTIGMINE METHYLSULFATE 10 MG/10ML IV SOLN
INTRAVENOUS | Status: DC | PRN
  Administered 2013-12-30 (×2): 1 mg via INTRAVENOUS

## 2013-12-30 MED ORDER — HYDROMORPHONE HCL 1 MG/ML IJ SOLN
INTRAMUSCULAR | Status: AC
  Filled 2013-12-30: qty 1

## 2013-12-30 MED ORDER — ROCURONIUM BROMIDE 100 MG/10ML IV SOLN
INTRAVENOUS | Status: DC | PRN
  Administered 2013-12-30: 10 mg via INTRAVENOUS
  Administered 2013-12-30: 20 mg via INTRAVENOUS
  Administered 2013-12-30: 5 mg via INTRAVENOUS
  Administered 2013-12-30: 30 mg via INTRAVENOUS
  Administered 2013-12-30: 10 mg via INTRAVENOUS
  Administered 2013-12-30: 40 mg via INTRAVENOUS
  Administered 2013-12-30: 35 mg via INTRAVENOUS
  Administered 2013-12-30 (×3): 20 mg via INTRAVENOUS

## 2013-12-30 MED ORDER — SODIUM CHLORIDE 0.9 % IV SOLN
INTRAVENOUS | Status: DC
Start: 2013-12-30 — End: 2013-12-31
  Administered 2013-12-30 – 2013-12-31 (×2): via INTRAVENOUS

## 2013-12-30 MED ORDER — FENTANYL CITRATE 0.05 MG/ML IJ SOLN
INTRAMUSCULAR | Status: AC
  Filled 2013-12-30: qty 5

## 2013-12-30 MED ORDER — LIDOCAINE HCL (CARDIAC) 20 MG/ML IV SOLN
INTRAVENOUS | Status: AC
  Filled 2013-12-30: qty 5

## 2013-12-30 MED ORDER — LACTATED RINGERS IV SOLN
INTRAVENOUS | Status: DC | PRN
  Administered 2013-12-30 (×2): via INTRAVENOUS

## 2013-12-30 MED ORDER — GLYCOPYRROLATE 0.2 MG/ML IJ SOLN
INTRAMUSCULAR | Status: DC | PRN
  Administered 2013-12-30: .4 mg via INTRAVENOUS

## 2013-12-30 MED ORDER — HYDROMORPHONE 0.3 MG/ML IV SOLN
INTRAVENOUS | Status: DC
  Administered 2013-12-30: 19:00:00 via INTRAVENOUS
  Administered 2013-12-31: 1.8 mg via INTRAVENOUS
  Administered 2013-12-31: 0.6 mg via INTRAVENOUS
  Administered 2013-12-31 (×2): 1.8 mg via INTRAVENOUS
  Administered 2013-12-31: 2.4 mg via INTRAVENOUS
  Administered 2013-12-31: 3.39 mg via INTRAVENOUS
  Administered 2014-01-01: 2.1 mg via INTRAVENOUS
  Administered 2014-01-01: 17:00:00 via INTRAVENOUS
  Administered 2014-01-01: 1.8 mg via INTRAVENOUS
  Administered 2014-01-01: 02:00:00 via INTRAVENOUS
  Administered 2014-01-01: 0.6 mg via INTRAVENOUS
  Administered 2014-01-01: 1.5 mg via INTRAVENOUS
  Administered 2014-01-01: 4.1 mg via INTRAVENOUS
  Administered 2014-01-02: 1.2 mg via INTRAVENOUS
  Administered 2014-01-02 (×2): 1.8 mg via INTRAVENOUS
  Administered 2014-01-02: 2.1 mg via INTRAVENOUS
  Administered 2014-01-02: 09:00:00 via INTRAVENOUS
  Administered 2014-01-02: 0.6 mg via INTRAVENOUS
  Administered 2014-01-02: 1.2 mg via INTRAVENOUS
  Administered 2014-01-03: 4.8 mg via INTRAVENOUS
  Administered 2014-01-03: 1.8 mg via INTRAVENOUS
  Administered 2014-01-03: 2.4 mg via INTRAVENOUS
  Administered 2014-01-03 (×2): 1.8 mg via INTRAVENOUS
  Administered 2014-01-03: 3.4 mg via INTRAVENOUS
  Administered 2014-01-04: 0 mg via INTRAVENOUS
  Administered 2014-01-04: 2.4 mg via INTRAVENOUS
  Filled 2013-12-30 (×7): qty 25

## 2013-12-30 MED ORDER — INDOCYANINE GREEN 25 MG IV SOLR
5.0000 mg | INTRAVENOUS | Status: DC | PRN
  Administered 2013-12-30: 5 mg

## 2013-12-30 MED ORDER — ONDANSETRON HCL 4 MG/2ML IJ SOLN: 4.0000 mg | Freq: Four times a day (QID) | INTRAMUSCULAR | Status: DC | PRN

## 2013-12-30 MED ORDER — HYDROMORPHONE HCL 1 MG/ML IJ SOLN
0.2500 mg | INTRAMUSCULAR | Status: DC | PRN
  Administered 2013-12-30 (×4): 0.5 mg via INTRAVENOUS

## 2013-12-30 MED ORDER — STERILE WATER FOR IRRIGATION IR SOLN
Status: DC | PRN
  Administered 2013-12-30: 1000 mL

## 2013-12-30 MED ORDER — MIDAZOLAM HCL 2 MG/2ML IJ SOLN
INTRAMUSCULAR | Status: AC
  Filled 2013-12-30: qty 2

## 2013-12-30 MED ORDER — ROCURONIUM BROMIDE 100 MG/10ML IV SOLN
INTRAVENOUS | Status: AC
Start: 2013-12-30 — End: 2013-12-30
  Filled 2013-12-30: qty 1

## 2013-12-30 MED ORDER — DIPHENHYDRAMINE HCL 50 MG/ML IJ SOLN
12.5000 mg | Freq: Four times a day (QID) | INTRAMUSCULAR | Status: DC | PRN
  Administered 2014-01-02 – 2014-01-03 (×3): 12.5 mg via INTRAVENOUS
  Filled 2013-12-30 (×3): qty 1

## 2013-12-30 MED ORDER — LACTATED RINGERS IV SOLN
INTRAVENOUS | Status: DC | PRN
  Administered 2013-12-30 (×5): via INTRAVENOUS

## 2013-12-30 MED ORDER — PHENYLEPHRINE HCL 10 MG/ML IJ SOLN
INTRAMUSCULAR | Status: DC | PRN
  Administered 2013-12-30: 80 ug via INTRAVENOUS
  Administered 2013-12-30: 40 ug via INTRAVENOUS
  Administered 2013-12-30: 80 ug via INTRAVENOUS
  Administered 2013-12-30 (×2): 40 ug via INTRAVENOUS
  Administered 2013-12-30 (×2): 80 ug via INTRAVENOUS

## 2013-12-30 MED ORDER — FENTANYL CITRATE 0.05 MG/ML IJ SOLN
INTRAMUSCULAR | Status: DC | PRN
  Administered 2013-12-30: 50 ug via INTRAVENOUS
  Administered 2013-12-30: 100 ug via INTRAVENOUS
  Administered 2013-12-30 (×2): 50 ug via INTRAVENOUS
  Administered 2013-12-30: 100 ug via INTRAVENOUS
  Administered 2013-12-30 (×10): 50 ug via INTRAVENOUS

## 2013-12-30 MED ORDER — PIPERACILLIN-TAZOBACTAM 3.375 G IVPB 30 MIN
3.3750 g | Freq: Once | INTRAVENOUS | Status: AC
  Administered 2013-12-30: 3.375 g via INTRAVENOUS

## 2013-12-30 MED ORDER — LACTATED RINGERS IR SOLN
Status: DC | PRN
  Administered 2013-12-30: 1000 mL

## 2013-12-30 MED ORDER — ONDANSETRON HCL 4 MG/2ML IJ SOLN
4.0000 mg | INTRAMUSCULAR | Status: DC | PRN
Start: 2013-12-30 — End: 2014-01-06

## 2013-12-30 MED ORDER — PROPOFOL 10 MG/ML IV BOLUS
INTRAVENOUS | Status: AC
  Filled 2013-12-30: qty 20

## 2013-12-30 MED ORDER — ONDANSETRON HCL 4 MG/2ML IJ SOLN
INTRAMUSCULAR | Status: AC
Start: 2013-12-30 — End: 2013-12-30
  Filled 2013-12-30: qty 2

## 2013-12-30 MED ORDER — LABETALOL HCL 5 MG/ML IV SOLN
INTRAVENOUS | Status: AC
  Filled 2013-12-30: qty 4

## 2013-12-30 MED ORDER — PROPOFOL 10 MG/ML IV BOLUS
INTRAVENOUS | Status: DC | PRN
  Administered 2013-12-30: 150 mg via INTRAVENOUS

## 2013-12-30 MED ORDER — DEXAMETHASONE SODIUM PHOSPHATE 10 MG/ML IJ SOLN
INTRAMUSCULAR | Status: AC
  Filled 2013-12-30: qty 1

## 2013-12-30 MED ORDER — NALOXONE HCL 0.4 MG/ML IJ SOLN: 0.4000 mg | INTRAMUSCULAR | Status: DC | PRN

## 2013-12-30 MED ORDER — DEXAMETHASONE SODIUM PHOSPHATE 10 MG/ML IJ SOLN
INTRAMUSCULAR | Status: DC | PRN
  Administered 2013-12-30: 10 mg via INTRAVENOUS

## 2013-12-30 MED ORDER — HYDROMORPHONE HCL 2 MG/ML IJ SOLN
INTRAMUSCULAR | Status: AC
  Filled 2013-12-30: qty 1

## 2013-12-30 MED ORDER — SODIUM CHLORIDE 0.9 % IJ SOLN
INTRAMUSCULAR | Status: AC
  Filled 2013-12-30: qty 20

## 2013-12-30 MED ORDER — PIPERACILLIN-TAZOBACTAM 3.375 G IVPB 30 MIN: 3.3750 g | Freq: Three times a day (TID) | INTRAVENOUS | Status: DC

## 2013-12-30 MED ORDER — PIPERACILLIN-TAZOBACTAM 3.375 G IVPB
INTRAVENOUS | Status: AC
  Filled 2013-12-30: qty 50

## 2013-12-30 MED ORDER — PIPERACILLIN-TAZOBACTAM 3.375 G IVPB
3.3750 g | Freq: Three times a day (TID) | INTRAVENOUS | Status: AC
  Administered 2013-12-31 – 2014-01-01 (×5): 3.375 g via INTRAVENOUS
  Filled 2013-12-30 (×5): qty 50

## 2013-12-30 MED ORDER — ROCURONIUM BROMIDE 100 MG/10ML IV SOLN
INTRAVENOUS | Status: AC
  Filled 2013-12-30: qty 1

## 2013-12-30 MED ORDER — SUCCINYLCHOLINE CHLORIDE 20 MG/ML IJ SOLN
INTRAMUSCULAR | Status: DC | PRN
  Administered 2013-12-30: 100 mg via INTRAVENOUS

## 2013-12-30 MED ORDER — PHENYLEPHRINE 40 MCG/ML (10ML) SYRINGE FOR IV PUSH (FOR BLOOD PRESSURE SUPPORT)
PREFILLED_SYRINGE | INTRAVENOUS | Status: AC
  Filled 2013-12-30: qty 10

## 2013-12-30 MED ORDER — ACETAMINOPHEN 10 MG/ML IV SOLN
1000.0000 mg | Freq: Once | INTRAVENOUS | Status: DC
  Filled 2013-12-30: qty 100

## 2013-12-30 MED ORDER — LABETALOL HCL 5 MG/ML IV SOLN
5.0000 mg | INTRAVENOUS | Status: DC | PRN
  Administered 2013-12-30: 5 mg via INTRAVENOUS
  Filled 2013-12-30: qty 4

## 2013-12-30 MED ORDER — MIDAZOLAM HCL 5 MG/5ML IJ SOLN
INTRAMUSCULAR | Status: DC | PRN
  Administered 2013-12-30 (×2): 1 mg via INTRAVENOUS

## 2013-12-30 MED ORDER — LIDOCAINE HCL (CARDIAC) 20 MG/ML IV SOLN
INTRAVENOUS | Status: DC | PRN
  Administered 2013-12-30: 50 mg via INTRAVENOUS

## 2013-12-30 MED ORDER — FENTANYL CITRATE 0.05 MG/ML IJ SOLN
INTRAMUSCULAR | Status: AC
  Filled 2013-12-30: qty 2

## 2013-12-30 MED ORDER — SODIUM CHLORIDE 0.9 % IR SOLN
Status: DC | PRN
  Administered 2013-12-30: 2000 mL

## 2013-12-30 MED ORDER — HYDROMORPHONE HCL 1 MG/ML IJ SOLN
INTRAMUSCULAR | Status: DC | PRN
  Administered 2013-12-30: 0.5 mg via INTRAVENOUS
  Administered 2013-12-30: 1 mg via INTRAVENOUS
  Administered 2013-12-30: 0.5 mg via INTRAVENOUS
  Administered 2013-12-30: 1 mg via INTRAVENOUS
  Administered 2013-12-30 (×2): 0.5 mg via INTRAVENOUS

## 2013-12-30 MED ORDER — ONDANSETRON HCL 4 MG/2ML IJ SOLN
INTRAMUSCULAR | Status: DC | PRN
  Administered 2013-12-30: 4 mg via INTRAVENOUS

## 2013-12-30 MED ORDER — BUPIVACAINE 0.25 % ON-Q PUMP SINGLE CATH 300ML
300.0000 mL | INJECTION | Status: DC
  Filled 2013-12-30: qty 300

## 2013-12-30 MED ORDER — ONDANSETRON HCL 4 MG/2ML IJ SOLN
INTRAMUSCULAR | Status: AC
  Filled 2013-12-30: qty 2

## 2013-12-30 MED ORDER — BUPIVACAINE LIPOSOME 1.3 % IJ SUSP
20.0000 mL | Freq: Once | INTRAMUSCULAR | Status: AC
  Administered 2013-12-30: 20 mL
  Filled 2013-12-30: qty 20

## 2013-12-30 SURGICAL SUPPLY — 99 items
APPLICATOR SURGIFLO ENDO (HEMOSTASIS) IMPLANT
BAG URINE DRAINAGE (UROLOGICAL SUPPLIES) ×3 IMPLANT
BAG URO CATCHER STRL LF (DRAPE) ×3 IMPLANT
BLADE SURG SZ10 CARB STEEL (BLADE) IMPLANT
CABLE HIGH FREQUENCY MONO STRZ (ELECTRODE) IMPLANT
CANISTER SUCTION 2500CC (MISCELLANEOUS) IMPLANT
CANNULA REDUC XI 12-8 STAPL (CANNULA) ×1
CANNULA REDUC XI 12-8MM STAPL (CANNULA) ×1
CANNULA REDUCER 12-8 DVNC XI (CANNULA) ×1 IMPLANT
CATH FOLEY 2WAY SLVR 18FR 30CC (CATHETERS) ×3 IMPLANT
CATH ROBINSON RED A/P 16FR (CATHETERS) IMPLANT
CHLORAPREP W/TINT 26ML (MISCELLANEOUS) ×3 IMPLANT
CLIP LIGATING HEM O LOK PURPLE (MISCELLANEOUS) ×12 IMPLANT
CLIP LIGATING HEMO LOK XL GOLD (MISCELLANEOUS) ×3 IMPLANT
CLOTH BEACON ORANGE TIMEOUT ST (SAFETY) ×3 IMPLANT
COVER SURGICAL LIGHT HANDLE (MISCELLANEOUS) IMPLANT
COVER TIP SHEARS 8 DVNC (MISCELLANEOUS) ×1 IMPLANT
COVER TIP SHEARS 8MM DA VINCI (MISCELLANEOUS) ×2
DECANTER SPIKE VIAL GLASS SM (MISCELLANEOUS) ×3 IMPLANT
DERMABOND ADVANCED (GAUZE/BANDAGES/DRESSINGS) ×2
DERMABOND ADVANCED .7 DNX12 (GAUZE/BANDAGES/DRESSINGS) ×1 IMPLANT
DRAPE ARM DVNC X/XI (DISPOSABLE) ×4 IMPLANT
DRAPE CAMERA CLOSED 9X96 (DRAPES) ×3 IMPLANT
DRAPE COLUMN DVNC XI (DISPOSABLE) ×1 IMPLANT
DRAPE DA VINCI XI ARM (DISPOSABLE) ×8
DRAPE DA VINCI XI COLUMN (DISPOSABLE) ×2
DRAPE WARM FLUID 44X44 (DRAPE) ×3 IMPLANT
DRSG TEGADERM 6X8 (GAUZE/BANDAGES/DRESSINGS) ×6 IMPLANT
ELECT CAUTERY BLADE 6.4 (BLADE) ×3 IMPLANT
ELECT REM PT RETURN 9FT ADLT (ELECTROSURGICAL) ×3
ELECTRODE REM PT RTRN 9FT ADLT (ELECTROSURGICAL) ×1 IMPLANT
GLOVE BIO SURGEON STRL SZ 6.5 (GLOVE) ×4 IMPLANT
GLOVE BIO SURGEONS STRL SZ 6.5 (GLOVE) ×2
GLOVE BIOGEL M STRL SZ7.5 (GLOVE) ×12 IMPLANT
GOWN STRL REUS W/TWL LRG LVL3 (GOWN DISPOSABLE) ×15 IMPLANT
KIT PROCEDURE DA VINCI SI (MISCELLANEOUS)
KIT PROCEDURE DVNC SI (MISCELLANEOUS) IMPLANT
LIQUID BAND (GAUZE/BANDAGES/DRESSINGS) ×6 IMPLANT
LOOP MINI RED (MISCELLANEOUS) ×3 IMPLANT
LOOP VESSEL MAXI BLUE (MISCELLANEOUS) ×3 IMPLANT
MANIFOLD NEPTUNE II (INSTRUMENTS) ×3 IMPLANT
MARKER SKIN DUAL TIP RULER LAB (MISCELLANEOUS) ×3 IMPLANT
NEEDLE INSUFFLATION 14GA 120MM (NEEDLE) ×3 IMPLANT
PACK CYSTO (CUSTOM PROCEDURE TRAY) ×3 IMPLANT
PACK ROBOT UROLOGY CUSTOM (CUSTOM PROCEDURE TRAY) ×3 IMPLANT
POUCH ENDO CATCH II 15MM (MISCELLANEOUS) ×3 IMPLANT
PUMP PAIN ON-Q (MISCELLANEOUS) ×3 IMPLANT
RELOAD PROXIMATE 75MM BLUE (ENDOMECHANICALS) ×9 IMPLANT
RELOAD STAPLER GOLD 60MM (STAPLE) ×1 IMPLANT
RELOAD STAPLER GREEN 60MM (STAPLE) ×1 IMPLANT
RELOAD STAPLER WHITE 60MM (STAPLE) ×2 IMPLANT
SEAL CANN UNIV 5-8 DVNC XI (MISCELLANEOUS) IMPLANT
SEAL XI 5MM-8MM UNIVERSAL (MISCELLANEOUS)
SET TUBE IRRIG SUCTION NO TIP (IRRIGATION / IRRIGATOR) ×3 IMPLANT
SOLUTION ELECTROLUBE (MISCELLANEOUS) ×3 IMPLANT
SPONGE LAP 18X18 X RAY DECT (DISPOSABLE) ×6 IMPLANT
SPONGE LAP 4X18 X RAY DECT (DISPOSABLE) ×3 IMPLANT
STAPLE ECHEON FLEX 60 POW ENDO (STAPLE) ×3 IMPLANT
STAPLER 45 BLU RELOAD XI (STAPLE) ×1 IMPLANT
STAPLER 45 BLUE RELOAD XI (STAPLE) ×2
STAPLER 45 WHITE RELOAD XI (STAPLE) ×14
STAPLER 45 WHT RELOAD XI (STAPLE) ×7 IMPLANT
STAPLER CANNULA SEAL DVNC XI (STAPLE) ×1 IMPLANT
STAPLER CANNULA SEAL XI (STAPLE) ×2
STAPLER PROXIMATE 75MM BLUE (STAPLE) ×3 IMPLANT
STAPLER RELOAD GOLD 60MM (STAPLE) ×3
STAPLER RELOAD GREEN 60MM (STAPLE) ×3
STAPLER RELOAD WHITE 60MM (STAPLE) ×6
STAPLER SHEATH (SHEATH) ×2
STAPLER SHEATH ENDOWRIST DVNC (SHEATH) ×1 IMPLANT
STENT SET URETHERAL LEFT 7FR (STENTS) ×3 IMPLANT
STENT SET URETHERAL RIGHT 7FR (STENTS) ×3 IMPLANT
SURGIFLO W/THROMBIN 8M KIT (HEMOSTASIS) IMPLANT
SUT CHROMIC 4 0 RB 1X27 (SUTURE) ×3 IMPLANT
SUT ETHILON 3 0 PS 1 (SUTURE) ×3 IMPLANT
SUT MNCRL AB 4-0 PS2 18 (SUTURE) ×6 IMPLANT
SUT PDS AB 0 CTX 36 PDP370T (SUTURE) ×18 IMPLANT
SUT SILK 3 0 SH 30 (SUTURE) IMPLANT
SUT SILK 3 0 SH CR/8 (SUTURE) ×6 IMPLANT
SUT VIC AB 2-0 UR5 27 (SUTURE) ×12 IMPLANT
SUT VIC AB 3-0 SH 27 (SUTURE) ×8
SUT VIC AB 3-0 SH 27X BRD (SUTURE) ×3 IMPLANT
SUT VIC AB 3-0 SH 27XBRD (SUTURE) ×1 IMPLANT
SUT VIC AB 4-0 PS2 18 (SUTURE) ×6 IMPLANT
SUT VIC AB 4-0 RB1 27 (SUTURE) ×14
SUT VIC AB 4-0 RB1 27XBRD (SUTURE) ×7 IMPLANT
SUT VICRYL 0 UR6 27IN ABS (SUTURE) ×6 IMPLANT
SUT VLOC BARB 180 ABS3/0GR12 (SUTURE) ×3
SUTURE VLOC BRB 180 ABS3/0GR12 (SUTURE) ×1 IMPLANT
SYSTEM UROSTOMY GENTLE TOUCH (WOUND CARE) ×3 IMPLANT
TOWEL OR NON WOVEN STRL DISP B (DISPOSABLE) ×3 IMPLANT
TROCAR 12M 150ML BLUNT (TROCAR) ×3 IMPLANT
TROCAR BLADELESS 15MM (ENDOMECHANICALS) ×6 IMPLANT
TUBING CONNECTING 10 (TUBING) ×2 IMPLANT
TUBING CONNECTING 10' (TUBING) ×1
URINEMETER 200ML W/220 (MISCELLANEOUS) ×3 IMPLANT
WATER STERILE IRR 1000ML UROMA (IV SOLUTION) ×3 IMPLANT
WATER STERILE IRR 1500ML POUR (IV SOLUTION) ×6 IMPLANT
YANKAUER SUCT BULB TIP 10FT TU (MISCELLANEOUS) IMPLANT

## 2013-12-30 NOTE — Care Management Note (Addendum)
    Page 1 of 1   01/01/2014     8:14:08 PM CARE MANAGEMENT NOTE 01/01/2014  Patient:  Jeremiah Wright, Jeremiah Wright   Account Number:  [id number]  Date Initiated:  12/30/2013  Documentation initiated by:  Dessa Phi  Subjective/Objective Assessment:   73 Y/O M ADMITTED W/BLADDER CA.     Action/Plan:   FROM HOME.   Anticipated DC Date:  01/03/2014   Anticipated DC Plan:  [place]  CM consult      Choice offered to / List presented to:  C-1 Patient           Status of service:  In process, will continue to follow Medicare Important Message given?  YES (If response is "NO", the following Medicare IM given date fields will be blank) Date Medicare IM given:  01/01/2014 Medicare IM given by:  [facility] Date Additional Medicare IM given:   Additional Medicare IM given by:    Discharge Disposition:    Per UR Regulation:  Reviewed for med. necessity/level of care/duration of stay  If discussed at [place] of Stay Meetings, dates discussed:    Comments:  01/01/14 Billye Nydam RN,BSN NCM [place] LIST.AWAIT CHOICE.RECOMMEND HHRN-OSTOMY.  12/30/13 Lateisha Thurlow RN,BSN NCM [phone] FOR CYSTOPROSTATECTOMY, & ILEAL CONDUIT.NO ANTICIPATED D/C NEEDS.

## 2013-12-30 NOTE — Brief Op Note (Signed)
12/29/2013 - 12/30/2013  5:25 PM  PATIENT:  Jeremiah Wright  73 y.o. male  PRE-OPERATIVE DIAGNOSIS:  RECURRENT BLADDER CANCER  POST-OPERATIVE DIAGNOSIS:  recurrent bladder cancer  PROCEDURE:  Procedure(s): XI ROBOTIC ASSISTED LAPAROSCOPIC COMPLETE CYSTECTOMY, ILEAL CONDUIT (N/A) CYSTOSCOPY WITH INDOCYANINE GREEN DYE INJECTION (N/A)  SURGEON:  Surgeon(s) and Role:    * Alexis Frock, MD - Primary  PHYSICIAN ASSISTANT:   ASSISTANTS: Curt Bears MD   ANESTHESIA:   local and general  EBL:  Total I/O In: 4500 [I.V.:4500] Out: 1350 [Urine:1200; Blood:150]  BLOOD ADMINISTERED:none  DRAINS: JP to bulb, Urostomy to striaght drain   LOCAL MEDICATIONS USED:  MARCAINE     SPECIMEN:  Source of Specimen:  ureteral margins, cystoprostatectomy, pelvic lymph nodes  DISPOSITION OF SPECIMEN:  PATHOLOGY  COUNTS:  YES  TOURNIQUET:  * No tourniquets in log *  DICTATION: .Other Dictation: Dictation Number [id number]  PLAN OF CARE: Admit to inpatient   PATIENT DISPOSITION:  PACU - hemodynamically stable.   Delay start of Pharmacological VTE agent (>24hrs) due to surgical blood loss or risk of bleeding: yes

## 2013-12-30 NOTE — Anesthesia Postprocedure Evaluation (Signed)
  Anesthesia Post-op Note  Patient: Jeremiah Wright  Procedure(s) Performed: Procedure(s) (LRB): XI ROBOTIC ASSISTED LAPAROSCOPIC COMPLETE CYSTECTOMY, ILEAL CONDUIT (N/A) CYSTOSCOPY WITH INDOCYANINE GREEN DYE INJECTION (N/A)  Patient Location: PACU  Anesthesia Type: General  Level of Consciousness: awake and alert   Airway and Oxygen Therapy: Patient Spontanous Breathing  Post-op Pain: mild  Post-op Assessment: Post-op Vital signs reviewed, Patient's Cardiovascular Status Stable, Respiratory Function Stable, Patent Airway and No signs of Nausea or vomiting  Last Vitals:  Filed Vitals:   12/30/13 1830  BP: 124/72  Pulse: 109  Temp: 37.2 C  Resp: 11    Post-op Vital Signs: stable   Complications: No apparent anesthesia complications

## 2013-12-30 NOTE — Progress Notes (Signed)
Day of Surgery  Subjective:  1 - Recurrent High Grade Bladder Cancer and Moderate Risk Prostate Cancer - Pt to undergo robotic cystoprostatectomy with cysto / ICG dye injection today. He had stomal marking and bowel prep to clear yesterday.  Objective: Vital signs in last 24 hours: Temp:  [97.4 F (36.3 C)-98.1 F (36.7 C)] 98 F (36.7 C) (11/18 0445) Pulse Rate:  [57-72] 57 (11/18 0445) Resp:  [16-20] 18 (11/18 0445) BP: (136-173)/(73-99) 136/73 mmHg (11/18 0445) SpO2:  [98 %-100 %] 99 % (11/18 0445) Weight:  [91.128 kg (200 lb 14.4 oz)] 91.128 kg (200 lb 14.4 oz) (11/17 1030) Last BM Date: 12/29/13  Intake/Output from previous day: 11/17 0701 - 11/18 0700 In: 1874.2 [P.O.:1360; I.V.:514.2] Out: -  Intake/Output this shift: Total I/O In: 448.3 [P.O.:240; I.V.:208.3] Out: -   General appearance: alert, cooperative and appears stated age Head: Normocephalic, without obvious abnormality, atraumatic Nose: Nares normal. Septum midline. Mucosa normal. No drainage or sinus tenderness. Throat: lips, mucosa, and tongue normal; teeth and gums normal Neck: supple, symmetrical, trachea midline Back: symmetric, no curvature. ROM normal. No CVA tenderness. Resp: non-labored on room air Cardio: Nl rate GI: soft, non-tender; bowel sounds normal; no masses,  no organomegaly Extremities: extremities normal, atraumatic, no cyanosis or edema Pulses: 2+ and symmetric Skin: Skin color, texture, turgor normal. No rashes or lesions Lymph nodes: Cervical, supraclavicular, and axillary nodes normal. Neurologic: Grossly normal  RLQ Urostomy site marked and clearly visible  Lab Results:   Recent Labs  12/29/13 1217  WBC 6.2  HGB 13.2  HCT 39.8  PLT 243   BMET  Recent Labs  12/29/13 1217  NA 143  K 4.7  CL 107  CO2 24  GLUCOSE 142*  BUN 17  CREATININE 1.20  CALCIUM 9.0   PT/INR No results for input(s): LABPROT, INR in the last 72 hours. ABG No results for input(s): PHART,  HCO3 in the last 72 hours.  Invalid input(s): PCO2, PO2  Studies/Results: No results found.  Anti-infectives: Anti-infectives    None      Assessment/Plan:  1 - Recurrent High Grade Bladder Cancer and Moderate Risk Prostate Cancer - Proceed as planned today with cystoprostatectomy and ileal conduit urinary diversion. Risk and benefits discussed extensively previously and reiterated.  Plan for ICU post-op, has RBC avail. [facility], Leolia Vinzant 12/30/2013

## 2013-12-30 NOTE — Transfer of Care (Signed)
Immediate Anesthesia Transfer of Care Note  Patient: Jeremiah Wright  Procedure(s) Performed: Procedure(s): XI ROBOTIC ASSISTED LAPAROSCOPIC COMPLETE CYSTECTOMY, ILEAL CONDUIT (N/A) CYSTOSCOPY WITH INDOCYANINE GREEN DYE INJECTION (N/A)  Patient Location: PACU  Anesthesia Type:General  Level of Consciousness: awake, oriented, patient cooperative, lethargic and responds to stimulation  Airway & Oxygen Therapy: Patient Spontanous Breathing and Patient connected to face mask oxygen  Post-op Assessment: Report given to PACU RN, Post -op Vital signs reviewed and stable and Patient moving all extremities  Post vital signs: Reviewed and stable  Complications: No apparent anesthesia complications

## 2013-12-31 ENCOUNTER — Encounter (HOSPITAL_COMMUNITY): Payer: Self-pay | Admitting: Urology

## 2013-12-31 LAB — CBC
HCT: 37.1 % — ABNORMAL LOW (ref 39.0–52.0)
Hemoglobin: 12.3 g/dL — ABNORMAL LOW (ref 13.0–17.0)
MCH: 29.8 pg (ref 26.0–34.0)
MCHC: 33.2 g/dL (ref 30.0–36.0)
MCV: 89.8 fL (ref 78.0–100.0)
Platelets: 255 10*3/uL (ref 150–400)
RBC: 4.13 MIL/uL — AB (ref 4.22–5.81)
RDW: 11.9 % (ref 11.5–15.5)
WBC: 12.4 10*3/uL — ABNORMAL HIGH (ref 4.0–10.5)

## 2013-12-31 LAB — BASIC METABOLIC PANEL WITH GFR
Anion gap: 10 (ref 5–15)
BUN: 14 mg/dL (ref 6–23)
CO2: 25 meq/L (ref 19–32)
Calcium: 8.5 mg/dL (ref 8.4–10.5)
Chloride: 108 meq/L (ref 96–112)
Creatinine, Ser: 1.37 mg/dL — ABNORMAL HIGH (ref 0.50–1.35)
GFR calc Af Amer: 58 mL/min — ABNORMAL LOW
GFR calc non Af Amer: 50 mL/min — ABNORMAL LOW
Glucose, Bld: 147 mg/dL — ABNORMAL HIGH (ref 70–99)
Potassium: 5.1 meq/L (ref 3.7–5.3)
Sodium: 143 meq/L (ref 137–147)

## 2013-12-31 MED ORDER — KCL IN DEXTROSE-NACL 20-5-0.45 MEQ/L-%-% IV SOLN
INTRAVENOUS | Status: DC
  Administered 2013-12-31 – 2014-01-02 (×5): via INTRAVENOUS
  Administered 2014-01-02: 50 mL/h via INTRAVENOUS
  Filled 2013-12-31 (×7): qty 1000

## 2013-12-31 MED ORDER — TRAMADOL HCL 50 MG PO TABS
50.0000 mg | ORAL_TABLET | Freq: Four times a day (QID) | ORAL | Status: DC | PRN
  Administered 2013-12-31 – 2014-01-05 (×9): 50 mg via ORAL
  Filled 2013-12-31 (×10): qty 1

## 2013-12-31 MED ORDER — HEPARIN SODIUM (PORCINE) 5000 UNIT/ML IJ SOLN
5000.0000 [IU] | Freq: Three times a day (TID) | INTRAMUSCULAR | Status: DC
  Administered 2013-12-31 – 2014-01-06 (×19): 5000 [IU] via SUBCUTANEOUS
  Filled 2013-12-31 (×20): qty 1

## 2013-12-31 MED ORDER — ZOLPIDEM TARTRATE 5 MG PO TABS
5.0000 mg | ORAL_TABLET | Freq: Every evening | ORAL | Status: DC | PRN
  Administered 2013-12-31 – 2014-01-05 (×5): 5 mg via ORAL
  Filled 2013-12-31 (×5): qty 1

## 2013-12-31 NOTE — Progress Notes (Signed)
PT Cancellation Note  Patient Details Name: Jeremiah Wright MRN: [id number] DOB: August 23, 1940   Cancelled Treatment:    Reason Eval/Treat Not Completed: Pain limiting ability to participate --pt requested PT check back on tomorrow. Will check back as schedule permits. Thanks.    Weston Anna, MPT Pager: [phone]

## 2013-12-31 NOTE — Op Note (Signed)
Jeremiah Wright, Jeremiah Wright                  ACCOUNT NO.:  [id number]  MEDICAL RECORD NO.:  [id number]  LOCATION:  1694                         FACILITY:  [facility]  PHYSICIAN:  Alexis Frock, MD     DATE OF BIRTH:  December 06, 1940  DATE OF PROCEDURE:  12/30/2013 DATE OF DISCHARGE:                              OPERATIVE REPORT   DIAGNOSES:  Recurrent high-grade bladder cancer, moderate risk prostate cancer.  PROCEDURE: 1. Cystoscopy with indocyanine green dye injection. 2. Robotic-assisted laparoscopic radical cystoprostatectomy with     bilateral pelvic lymphadenectomy and ileal conduit urinary     diversion.  ASSISTANT:  Gypsy Lore, MD.  ESTIMATED BLOOD LOSS:  150 mL.  COMPLICATIONS:  None.  SPECIMENS: 1. Right ureteral margin frozen section, negative. 2. Left ureteral margin frozen section, negative. 3. Cystoprostatectomy for permanent pathology. 4. Right external iliac lymph node, sentinel. 5. Right obturator lymph node, sentinel. 6. Right common iliac lymph node, sentinel. 7. Left external iliac lymph node. 8. Left obturator lymph node. 9. Left common iliac lymph node, sentinel.  DRAINS: 1. Jackson-Pratt drain bulb suction. 2. Urostomy to straight drain.  Bander stents in place.  Right is red.     Left is blue.  INDICATION:  Jeremiah Wright is a very pleasant, 73 year old gentleman with history of high-grade T1 bladder cancer.  This is status post endoscopic resection multiple times as well as mitomycin and full induction of BCG. In spite of these maneuvers, high-grade bladder tumor has been recurrent, stage T1.  The patient also has had significant obstructive urinary symptoms and underwent recent transurethral resection of the prostate and was found incidentally to have Gleason 7 adenocarcinoma as well despite a relatively normal PSA.  Given this constellation of findings, his recurrent high-grade bladder cancer, as well as moderate- risk prostate cancer, options were discussed  with the patient including possible surgical extirpation with radical cystoprostatectomy with curative intent as well as alternative management strategies including ablative therapies, surveillance protocols, re-induction of BCG for his bladder, and he wished to proceed with cystoprostatectomy with some invasive assistance.  Informed consent was obtained and placed in the medical record.  The patient was then admitted preoperatively and has undergone bowel prep.  PROCEDURE IN DETAIL:  The patient being Jeremiah Wright verified, procedure being cystoprostatectomy was confirmed with cystoscopy and dye injection.  Procedure was carried out.  Time-out was performed. Intravenous antibiotics were administered.  General endotracheal anesthesia was introduced.  The patient was initially placed into a low lithotomy position.  Sterile field was created by prepping and draping the patient's penis, perineum, and proximal thighs using iodine x3. Next, cystourethroscopy was performed using a 24-French 0-degree injection scope set.  Inspection of the anterior and posterior urethra revealed a prior TURP defect as expected.  Inspection of the urinary bladder revealed bilateral Hutch diverticula near the ureteral orifices at the area of the bladder neck and right Hutch diverticulum.  There were papillary changes consistent with likely further recurrence in the bladder tumor.  Next, approximately 2 mL of indocyanine green dye was injected in the submucosal location through the bladder neck, the mouth of the Hutch diverticulum with tumor, and in the prostatic urethra  taking great care to try to avoid undue perforation of these structures, which did not occur grossly.  The patient was then completely repositioned into a supine position.  His arms were tucked.  Very careful attention was directed at tucking his right arm where arterial line was placed.  He was further fashioned on the operative table using  3- inch tape over his chest, across foam padding.  A test of steep Trendelenburg position was performed, he was found to be suitably positioned.    A new sterile field was created by prepping and draping his infra-xiphoid abdomen using chlorhexidine gluconate in his penis, perineum using iodine, and a high-flow low pressure pneumoperitoneum was obtained using Veress technique in the supraumbilical midline having passed the aspiration and drop test.  An 8-mm robotic camera port was placed into this location.  Laparoscopic examination of the peritoneal cavity revealed significant diverticulosis with some adhesions of the sigmoid colon to the left abdominal wall, likely consistent with prior diverticulitis.  Additional ports were then carefully placed as follows; right paramedian 15-mm assistant port at the previously marked urostomy site, right paramedian 10-mm robotic port site, right far lateral 12-mm assistant port site, left paramedian 8-mm robotic port site, and left far lateral 8-mm robotic port site.  Robot was docked and passed through electronic checks.  Attention was directed at identification of the ureters.  First on the left side.  Incision was made in the posterior peritoneum in the area of the pulsations of the iliac vessels.  There was significant inflammatory tissue in this area; however, the left ureter was encountered, circumferentially mobilized, dissected to the bladder hiatus, and then also proximally above the iliac crossing for a distance of additional 6 cm.  Left ureter was doubly clipped and ligated, the proximal end having a stay suture placed.  Dissection then proceeded lateral to the left bladder just lateral to the left medial umbilical ligament, sweeping the bladder away from the pelvic sidewall towards the area of the endopelvic fascia, which was then carefully swept away from the lateral aspect of the prostate towards the apex of the prostate.  A mirror  image dissection was performed on the right side identifying the right ureter dissecting distally to the bladder hiatus, doubly clipping and ligating, freeing up the right bladder wall towards the area of the endopelvic fascia, and then the lateral aspect of the prostate towards the area of the prostatic apex.  The right ureter was mobilized additionally above the iliac crossing for a distance of approximately 3 more cm.  Distal ureteral margins were sent for frozen section, found to be negative for carcinoma.  Attention was directed at pelvic lymphadenectomy.  The entire pelvis was interrogated under fluorescent light.  Inspection of the pelvis using sentinel ICG lymphangiography revealed multiple sentinel lymph nodes in the right external, right obturator, right common, and left common group respectively.  As such, lymphadenectomy was started on the right side.  First at the right external iliac group with confines being the right external iliac artery, nerve, pelvic sidewall, iliac bifurcation.  Lymphostasis was achieved with cold clips.  Again, there were multiple sentinel lymph nodes in this packet and was labeled as such.  The right obturator group was carefully mobilized with the confines being the obturator nerve, external iliac vein, pelvic sidewall.  There were also several vividly fluorescent lymph nodes in this packet and this was labeled right obturator lymph node sentinel.  Another sentinel hyperfluorescence lymph node was seen in the right  common iliac.  This was carefully mobilized and set aside for permanent pathology.  A mirror image lymphadenectomy was performed on the left side; however, there was no sentinel tissue within the left external or left obturator groups. The obturator nerve was inspected and following maneuvers were found to be intact.  Attention was then directed to posterior dissection, the previous posterior peritoneal incisions were connected in the  midline and a posterior peritoneal flap was developed posterior to the bladder, and this was carried down towards the area of the vas and seminal vesicles, which were purposely kept superiorly with the bladder specimen and then towards the area of the prostatic apex just superior to Denonvilliers fascia.  This exposed the vascular pedicles of the bladder and prostate laterally.  These were controlled using sequential stapling technique with 46 mm white load robotic stapling x3 each side, this resulted in excellent hemostatic control of the bladder and prostate pedicles respectively.  Next, anterior dissection was performed by incising the medial umbilical ligaments bilaterally dropping the bladder in the midline, which exposed the anterior base of the prostate.  The dorsovenous complex was carefully identified and controlled using combination of cautery energy and vascular robotic stapler.  The membranous urethra was then identified and transected coldly.  The posterior urethralis muscle was then transected, which completely freed up the cystoprostatectomy specimen, this was placed in an extra large EndoCatch bag for later retrieval.  The membranous urethra was oversewn using 3-0 V-Loc to prevent excessive penile drainage.  The area of the rectum was very carefully inspected and there was no rectal violation noted.  The left ureter was then brought underneath the colon by tunneling in the retroperitoneum directly on top of the aortic bifurcation and bringing the left ureter through this window to the right side, which had easily traversed with undue tension.  Ureter was then marked with laparoscopic grasper, robot was then undocked.    The patient was made completely supine out of Trendelenburg position, and the previous camera port site was extended for a total distance of approximately 10 cm inferiorly.  The Omni-Tract retractor was deployed. The right and left ureters previously  marked were identified.  Bowel was retracted after identifying the terminal ileum and taking a segment 15 cm in length out of continuity using bowel load stapler.  Reanastomosis was performed using a side-to-side stapling technique with GIA stapler and oversewn in the free end by hand with silk.  The acute angle of this was buttressed with interrupted silk and the mesenteric defect was also reapproximated using interrupted silk to avoid internal hernia.  The bowel anastomosis was visibly viable and palpably patent and a second imbricating layer was applied over the free end using silk.  The conduit segment was then verified to be in the extraperitoneal position with the distal end carefully marked.  Attention was then directed at ureteroileal anastomosis.  First on the left side, a small approximately 7-mm segment of butt end of conduit removed.  The mucosa incised and this was imbricated x4 using 4-0 Vicryl in interrupted fashion.  The left ureter was carefully spatulated for a total distance of approximately 7-8 mm.  A heel stitch was applied with interrupted Vicryl and the blue-colored Bander stent was placed 25 cm to the anastomosis on the side, which resulted in excellent efflux of the urine after placement of the stent.  Anastomosis was further completed using 2 separate suture lines of running 4-0 Vicryl from the heel stitch anteriorly.  Similarly, the  right ureter was anastomosed to the butt end of the conduit on the contralateral side through a separate small incision that was imbricated at the level of the bowel mucosa. Similarly, the right ureteral anastomosis was spatulated and performed using a heel stitch followed by right red-colored Bander stent 25 cm to the anastomosis in 2 separate suture lines of running 4-0 Vicryl.  The conduit was then retrograde filled and no gross leak was detected.  The conduit was matured by extending and making a small cruciate incision at the  level of the fascia at the previously marked stoma site, which previously held the 15-mm port site and bringing the conduit through this area.  It was fashioned to the fascia in each of the apices of the cruciate incision x4 using 2-0 Vicryl and then x4 imbricated in rosebud fashion with intervening 3-0 Vicryl sutures.  The Bander stents were further fashioned to the conduit segment using a single interrupted 4-0 chromic stitch to prevent early migration.  The previous right 12-mm assistant port site was closed at the level of the fascia under direct vision.  Retractor was taken out as were all sponge and needles, and these counts were confirmed.  An area of healthy-looking omentum was brought across the area of the lower midline incision, which was then closed at the level of fascia using figure-of-eight PDS x10 followed by reapproximation of Scarpa's using running Vicryl.  All incision sites were infiltrated with dilute heparinized Marcaine and closed at the level of the skin using subcuticular Monocryl followed by Dermabond. Before closure, a closed suction drain was brought through the previous left paramedian robotic port site near the peritoneal cavity and sutured in place with drain stitch of nylon. Procedure was then terminated.  The patient tolerated the procedure well.  There were no immediate periprocedural complications.  The patient was taken to the postanesthesia care unit in stable condition with plan for step-down admission.          ______________________________ Alexis Frock, MD     TM/MEDQ  D:  12/30/2013  T:  12/31/2013  Job:  [id number]

## 2013-12-31 NOTE — Progress Notes (Signed)
1 Day Post-Op  Subjective:  1 - Recurrent High Grade Bladder Cancer and Moderate Risk Prostate Cancer - s/p robotic cystoprostatectomy with cysto / ICG dye injection 12/30/2013. Path pending. Observed in stepdown POD 0. Wound ostomy follow since pre-op admission.   2 - Ileus - NPO peri-op due to bowel anastomoses at surgery above.   3 - Disposition - No significant functional baseline deficits, lives independantly. PT eval pending.   Today Otilio only c/o some abd soreness. No CP,SOB. Hgb and Cr acceptable. KUB with stents in good position.   Objective: Vital signs in last 24 hours: Temp:  [97.6 F (36.4 C)-99.3 F (37.4 C)] 98.9 F (37.2 C) (11/19 0446) Pulse Rate:  [95-117] 101 (11/19 0500) Resp:  [9-18] 18 (11/19 0500) BP: (99-172)/(54-99) 137/73 mmHg (11/19 0500) SpO2:  [94 %-100 %] 95 % (11/19 0500) Arterial Line BP: (100-128)/(80-104) 100/80 mmHg (11/18 1815) Weight:  [94.9 kg (209 lb 3.5 oz)] 94.9 kg (209 lb 3.5 oz) (11/19 0446) Last BM Date: 12/29/13  Intake/Output from previous day: 11/18 0701 - 11/19 0700 In: 6750 [I.V.:6700; IV Piggyback:50] Out: 4081 [Urine:2050; Drains:230; Blood:150] Intake/Output this shift:    General appearance: alert, cooperative and appears stated age Head: Normocephalic, without obvious abnormality, atraumatic Nose: Nares normal. Septum midline. Mucosa normal. No drainage or sinus tenderness. Throat: lips, mucosa, and tongue normal; teeth and gums normal Neck: supple, symmetrical, trachea midline Back: symmetric, no curvature. ROM normal. No CVA tenderness. Resp: non labored on [place] O2. Chest wall: no tenderness Cardio: mild tachycardia with normal rythem by bedise monitor.  GI: soft, non-tender; bowel sounds normal; no masses,  no organomegaly Male genitalia: normal Extremities: extremities normal, atraumatic, no cyanosis or edema and SCD's in place.  Pulses: 2+ and symmetric Skin: Skin color, texture, turgor normal. No rashes or  lesions Lymph nodes: Cervical, supraclavicular, and axillary nodes normal. Neurologic: Alert and oriented X 3, normal strength and tone. Normal symmetric reflexes. Normal coordination and gait  Lab Results:   Recent Labs  12/30/13 1845 12/31/13 0354  WBC 14.7* 12.4*  HGB 14.0 12.3*  HCT 41.3 37.1*  PLT 293 255   BMET  Recent Labs  12/30/13 1845 12/31/13 0354  NA 138 143  K 4.3 5.1  CL 104 108  CO2 22 25  GLUCOSE 157* 147*  BUN 12 14  CREATININE 1.34 1.37*  CALCIUM 8.7 8.5   PT/INR No results for input(s): LABPROT, INR in the last 72 hours. ABG No results for input(s): PHART, HCO3 in the last 72 hours.  Invalid input(s): PCO2, PO2  Studies/Results: Abdomen 1 View (kub)  12/30/2013   CLINICAL DATA:  Fitting and adjustment of urinary device.  EXAM: ABDOMEN - 1 VIEW  COMPARISON:  CT scan of December 02, 2013.  FINDINGS: The bowel gas pattern is normal. Bilateral ureteral stents are noted which appear to be externalized. Surgical drain is noted in the pelvis. No abnormal calcifications are noted.  IMPRESSION: No evidence of bowel obstruction or ileus. Bilateral ureteral stents are noted which appear to be externalized.   Electronically Signed   By: Sabino Dick M.D.   On: 12/30/2013 19:26    Anti-infectives: Anti-infectives    Start     Dose/Rate Route Frequency Ordered Stop   12/30/13 2200  piperacillin-tazobactam (ZOSYN) IVPB 3.375 g  Status:  Discontinued     3.375 g100 mL/hr over 30 Minutes Intravenous 3 times per day 12/30/13 1757 12/30/13 1805   12/30/13 1830  piperacillin-tazobactam (ZOSYN) IVPB 3.375 g  3.375 g12.5 mL/hr over 240 Minutes Intravenous Every 8 hours 12/30/13 1807 01/01/14 1829   12/30/13 0900  piperacillin-tazobactam (ZOSYN) IVPB 3.375 g     3.375 g100 mL/hr over 30 Minutes Intravenous  Once 12/30/13 0838 12/30/13 0900      Assessment/Plan:  1 - Recurrent High Grade Bladder Cancer and Moderate Risk Prostate Cancer - Doing well POD 0.  Transfer to floor, start proph [place] heparin as Hgb acceptable.   2 - Ileus - NP today, continue MIVF.   3 - Disposition - transfer to floor. PT eval for help with return to ambualtion and DC planning.   [facility], Lakeem Rozo 12/31/2013

## 2014-01-01 LAB — BASIC METABOLIC PANEL
Anion gap: 8 (ref 5–15)
BUN: 13 mg/dL (ref 6–23)
CHLORIDE: 100 meq/L (ref 96–112)
CO2: 27 mEq/L (ref 19–32)
Calcium: 8.3 mg/dL — ABNORMAL LOW (ref 8.4–10.5)
Creatinine, Ser: 1.3 mg/dL (ref 0.50–1.35)
GFR calc Af Amer: 62 mL/min — ABNORMAL LOW (ref >90)
GFR calc non Af Amer: 53 mL/min — ABNORMAL LOW (ref >90)
GLUCOSE: 121 mg/dL — AB (ref 70–99)
Potassium: 4.5 mEq/L (ref 3.7–5.3)
Sodium: 135 mEq/L — ABNORMAL LOW (ref 137–147)

## 2014-01-01 LAB — HEMOGLOBIN AND HEMATOCRIT, BLOOD
HEMATOCRIT: 35.6 % — AB (ref 39.0–52.0)
HEMOGLOBIN: 11.5 g/dL — AB (ref 13.0–17.0)

## 2014-01-01 NOTE — Evaluation (Signed)
Physical Therapy Evaluation Patient Details Name: Jeremiah Wright MRN: [id number] DOB: April 26, 1940 Today's Date: 01/01/2014   History of Present Illness  73 yo male s/p cystoprostatectomy, ileal conduit 12/30/13.   Clinical Impression  On eval, pt required Min assist for mobility-able to ambulate ~125 feet with rolling walker. Tolerated activity well. Expect pt to be able to ambulate without use of walker soon. Recommend daily ambulation with nursing supervision. Will follow.     Follow Up Recommendations Home health PT;Supervision/Assistance - 24 hour    Equipment Recommendations   (to be determined)    Recommendations for Other Services OT consult     Precautions / Restrictions Precautions Precautions: Fall Precaution Comments: abd surgery. drain L side Restrictions Weight Bearing Restrictions: No      Mobility  Bed Mobility   Bed Mobility: Supine to Sit     Supine to sit: HOB elevated;Min assist     General bed mobility comments: assist for trunk to upright. increased time. pt used bedrail  Transfers Overall transfer level: Needs assistance Equipment used: Rolling walker (2 wheeled) Transfers: Sit to/from Stand Sit to Stand: Min assist         General transfer comment: assist to rise, stabilize, control descent.   Ambulation/Gait Ambulation/Gait assistance: Min assist Ambulation Distance (Feet): 125 Feet Assistive device: Rolling walker (2 wheeled) Gait Pattern/deviations: Step-through pattern;Decreased stride length     General Gait Details: assist to stabilize and manage lines/equipment. remained on [place] o2-sats 97%.   Stairs            Wheelchair Mobility    Modified Rankin (Stroke Patients Only)       Balance                                             Pertinent Vitals/Pain Pain Assessment: Faces Faces Pain Scale: Hurts little more Pain Location: abdomen Pain Descriptors / Indicators: Dull Pain Intervention(s):  Monitored during session    Home Living   Living Arrangements: Spouse/significant other   Type of Home: House Home Access: Stairs to enter Entrance Stairs-Rails: Right Entrance Stairs-Number of Steps: 4 Home Layout: Two level Home Equipment: Environmental consultant - 2 wheels      Prior Function Level of Independence: Independent               Hand Dominance        Extremity/Trunk Assessment   Upper Extremity Assessment: Generalized weakness           Lower Extremity Assessment: Generalized weakness      Cervical / Trunk Assessment: Normal  Communication   Communication: No difficulties  Cognition Arousal/Alertness: Awake/alert Behavior During Therapy: WFL for tasks assessed/performed Overall Cognitive Status: Within Functional Limits for tasks assessed                      General Comments      Exercises        Assessment/Plan    PT Assessment Patient needs continued PT services  PT Diagnosis Difficulty walking;Generalized weakness;Acute pain   PT Problem List Decreased strength;Decreased range of motion;Decreased activity tolerance;Decreased balance;Pain;Decreased knowledge of use of DME;Decreased mobility  PT Treatment Interventions DME instruction;Gait training;Functional mobility training;Therapeutic activities;Patient/family education;Balance training;Therapeutic exercise   PT Goals (Current goals can be found in the Care Plan section) Acute Rehab PT Goals Patient Stated Goal: regain independence. less pain PT  Goal Formulation: With patient Time For Goal Achievement: 01/15/14 Potential to Achieve Goals: Good    Frequency Min 3X/week   Barriers to discharge        Co-evaluation               End of Session Equipment Utilized During Treatment: Oxygen Activity Tolerance: Patient tolerated treatment well Patient left: in chair;with call bell/phone within reach;with chair alarm set           Time: 1026-1050 PT Time Calculation  (min) (ACUTE ONLY): 24 min   Charges:   PT Evaluation $Initial PT Evaluation Tier I: 1 Procedure PT Treatments $Gait Training: 8-22 mins $Therapeutic Activity: 8-22 mins   PT G Codes:          Weston Anna, MPT Pager: [phone]

## 2014-01-01 NOTE — Progress Notes (Signed)
2 Days Post-Op  Subjective:  1 - Recurrent High Grade Bladder Cancer and Moderate Risk Prostate Cancer - s/p robotic cystoprostatectomy with cysto / ICG dye injection 12/30/2013. Path pending. Observed in stepdown POD 0 and transferred to 4th floor med-surg POD1.  Wound ostomy follow since pre-op admission.   2 - Ileus - NPO peri-op due to bowel anastomoses at surgery above. Started clears POD 2.   3 - Disposition - No significant functional baseline deficits, lives independantly. PT eval pending.   Today Jeremiah Wright is w/o complaints. No nausea / emesis / wound problems.   Objective: Vital signs in last 24 hours: Temp:  [98.2 F (36.8 C)-98.6 F (37 C)] 98.5 F (36.9 C) (11/20 0601) Pulse Rate:  [85-96] 85 (11/20 0601) Resp:  [16-20] 16 (11/20 0601) BP: (129-166)/(77-96) 165/82 mmHg (11/20 0601) SpO2:  [96 %-100 %] 98 % (11/20 0601) Last BM Date: 12/29/13  Intake/Output from previous day: 11/19 0701 - 11/20 0700 In: 2462.5 [I.V.:2312.5; IV Piggyback:150] Out: 2400 [Urine:2140; Drains:260] Intake/Output this shift: Total I/O In: 1487.5 [I.V.:1437.5; IV Piggyback:50] Out: 5427 [Urine:1410; Drains:150]  General appearance: alert, cooperative and appears stated age Head: Normocephalic, without obvious abnormality, atraumatic Nose: Nares normal. Septum midline. Mucosa normal. No drainage or sinus tenderness. Throat: lips, mucosa, and tongue normal; teeth and gums normal Neck: supple, symmetrical, trachea midline Back: symmetric, no curvature. ROM normal. No CVA tenderness. Resp: non-labored on [place] O2 Chest wall: no tenderness Cardio: Nl rate GI: soft, non-tender; bowel sounds normal; no masses,  no organomegaly Male genitalia: normal, no spotting Extremities: extremities normal, atraumatic, no cyanosis or edema and SCD's in place  Pulses: 2+ and symmetric Skin: Skin color, texture, turgor normal. No rashes or lesions Lymph nodes: Cervical, supraclavicular, and axillary nodes  normal. Neurologic: Grossly normal Incision/Wound: Recent port and extraction sites c/d/i. RLQ Urostomy pink / patent with bander stents in place. JP with serosanguinous efflux.   Lab Results:   Recent Labs  12/30/13 1845 12/31/13 0354 01/01/14 0530  WBC 14.7* 12.4*  --   HGB 14.0 12.3* 11.5*  HCT 41.3 37.1* 35.6*  PLT 293 255  --    BMET  Recent Labs  12/30/13 1845 12/31/13 0354  NA 138 143  K 4.3 5.1  CL 104 108  CO2 22 25  GLUCOSE 157* 147*  BUN 12 14  CREATININE 1.34 1.37*  CALCIUM 8.7 8.5   PT/INR No results for input(s): LABPROT, INR in the last 72 hours. ABG No results for input(s): PHART, HCO3 in the last 72 hours.  Invalid input(s): PCO2, PO2  Studies/Results: Abdomen 1 View (kub)  12/30/2013   CLINICAL DATA:  Fitting and adjustment of urinary device.  EXAM: ABDOMEN - 1 VIEW  COMPARISON:  CT scan of December 02, 2013.  FINDINGS: The bowel gas pattern is normal. Bilateral ureteral stents are noted which appear to be externalized. Surgical drain is noted in the pelvis. No abnormal calcifications are noted.  IMPRESSION: No evidence of bowel obstruction or ileus. Bilateral ureteral stents are noted which appear to be externalized.   Electronically Signed   By: Sabino Dick M.D.   On: 12/30/2013 19:26    Anti-infectives: Anti-infectives    Start     Dose/Rate Route Frequency Ordered Stop   12/30/13 2200  piperacillin-tazobactam (ZOSYN) IVPB 3.375 g  Status:  Discontinued     3.375 g100 mL/hr over 30 Minutes Intravenous 3 times per day 12/30/13 1757 12/30/13 1805   12/30/13 1830  piperacillin-tazobactam (ZOSYN) IVPB 3.375 g  3.375 g12.5 mL/hr over 240 Minutes Intravenous Every 8 hours 12/30/13 1807 01/01/14 1829   12/30/13 0900  piperacillin-tazobactam (ZOSYN) IVPB 3.375 g     3.375 g100 mL/hr over 30 Minutes Intravenous  Once 12/30/13 0838 12/30/13 0900      Assessment/Plan:  1 - Recurrent High Grade Bladder Cancer and Moderate Risk Prostate Cancer -  path pending. Doing well POD 2.  2 - Ileus - Abd not distended, no emesis,  begin clears today, cautioned to go slow if nausea develops  3 - Disposition - PT eval pending, importance of ambulation reinforced. Expect DC home likely next week.   [facility], Makennah Omura 01/01/2014

## 2014-01-02 LAB — TYPE AND SCREEN
ABO/RH(D): A NEG
Antibody Screen: NEGATIVE
UNIT DIVISION: 0
Unit division: 0

## 2014-01-02 LAB — BASIC METABOLIC PANEL
ANION GAP: 8 (ref 5–15)
BUN: 11 mg/dL (ref 6–23)
CALCIUM: 8.6 mg/dL (ref 8.4–10.5)
CHLORIDE: 100 meq/L (ref 96–112)
CO2: 27 meq/L (ref 19–32)
Creatinine, Ser: 1.09 mg/dL (ref 0.50–1.35)
GFR calc Af Amer: 76 mL/min — ABNORMAL LOW (ref >90)
GFR calc non Af Amer: 66 mL/min — ABNORMAL LOW (ref >90)
Glucose, Bld: 100 mg/dL — ABNORMAL HIGH (ref 70–99)
Potassium: 4.1 mEq/L (ref 3.7–5.3)
SODIUM: 135 meq/L — AB (ref 137–147)

## 2014-01-02 LAB — HEMOGLOBIN AND HEMATOCRIT, BLOOD
HCT: 35.6 % — ABNORMAL LOW (ref 39.0–52.0)
HEMOGLOBIN: 11.8 g/dL — AB (ref 13.0–17.0)

## 2014-01-02 NOTE — Consult Note (Signed)
WOC ostomy follow up Stoma type/location: RLQ urostomy.  Patient, wife and daughter participated in teaching session Stomal assessment/size: 1 and 1/8 inches round, moist, slightly budded, os at center.  2 stents intact, red = right, blue = left Peristomal assessment: Clear.  There is a 1.5cm round intact fracture blister at the medial edge of the skin barrier, medical adhesive related skin injury (MARSI) at 1 o'clock. Treatment options for stomal/peristomal skin: none Output amber urine Ostomy pouching: 1pc.pouching system with convexity  Education provided: Extended session for teaching stoma and pouch characteristics, pouch removal and application, stoma sizing, stents, nighttime drainage bag use and care of bag, supplies.  Change routine (twice weekly) taught, emptying routine (when pouch is one-half to one-third full).  Also provided with demonstration of attaching and disconnecting to bedside drain bag, anti-reflux feature of pouching system. Patient and family taught about mucus in pouch being normal and special precautions regarding medical personnel obtaining urine samples.  Asking appropriate questions and all are answered to their satisfaction today.  HHRN is recommended for continued teaching and support. Enrolled patient in [facility] Discharge program:No [place] nursing team will follow, and will remain available to this patient, the nursing, surgical and medical teams.   Thanks, Maudie Flakes, MSN, RN, [place], [place], [place] [phone])

## 2014-01-02 NOTE — Progress Notes (Signed)
Physical Therapy Treatment Patient Details Name: Jeremiah Wright MRN: [id number] DOB: 1940/09/21 Today's Date: 2014-01-28    History of Present Illness 73 yo male s/p cystoprostatectomy, ileal conduit 12/30/13.     PT Comments    Pt progressing, incr amb today, motivated to walk with PT  Follow Up Recommendations  Home health PT;Supervision/Assistance - 24 hour     Equipment Recommendations  Rolling walker with 5" wheels    Recommendations for Other Services       Precautions / Restrictions Restrictions Weight Bearing Restrictions: No    Mobility  Bed Mobility Overal bed mobility: Needs Assistance Bed Mobility: Supine to Sit     Supine to sit: HOB elevated;Min guard;Supervision        Transfers Overall transfer level: Needs assistance Equipment used: Rolling walker (2 wheeled) Transfers: Sit to/from Stand Sit to Stand: Min guard         General transfer comment: verbal cues for hand placement and safety, backing up to surface  Ambulation/Gait Ambulation/Gait assistance: Min guard;Supervision Ambulation Distance (Feet): 240 Feet Assistive device: Rolling walker (2 wheeled) Gait Pattern/deviations: Step-through pattern;Trunk flexed;Drifts right/left;Decreased stride length     General Gait Details: cues for posture and RW distance from self   Stairs            Wheelchair Mobility    Modified Rankin (Stroke Patients Only)       Balance                                    Cognition Arousal/Alertness: Awake/alert Behavior During Therapy: WFL for tasks assessed/performed Overall Cognitive Status: Within Functional Limits for tasks assessed                      Exercises      General Comments General comments (skin integrity, edema, etc.): HR 122 during amb, low 101-107 after amb,O2 Sats 96% on 2L [place],        Pertinent Vitals/Pain Pain Assessment: No/denies pain    Home Living                      Prior  Function            PT Goals (current goals can now be found in the care plan section) Acute Rehab PT Goals Patient Stated Goal: regain independence. less pain Time For Goal Achievement: 01/15/14 Potential to Achieve Goals: Good Progress towards PT goals: Progressing toward goals    Frequency  Min 3X/week    PT Plan Current plan remains appropriate    Co-evaluation             End of Session Equipment Utilized During Treatment: Oxygen Activity Tolerance: Patient tolerated treatment well Patient left: in chair;with call bell/phone within reach;with family/visitor present     Time: 1194-1740 PT Time Calculation (min) (ACUTE ONLY): 13 min  Charges:  $Gait Training: 8-22 mins                    G Codes:      Margaruite Top 01/28/14, 5:17 PM

## 2014-01-02 NOTE — Plan of Care (Signed)
Problem: Phase I Progression Outcomes Goal: Pain controlled with appropriate interventions Outcome: Completed/Met Date Met:  01/02/14 Goal: OOB as tolerated unless otherwise ordered Outcome: Completed/Met Date Met:  01/02/14 Goal: Incision/dressings dry and intact Outcome: Completed/Met Date Met:  01/02/14 Goal: Tubes/drains patent Outcome: Completed/Met Date Met:  01/02/14 Goal: Initial discharge plan identified Outcome: Completed/Met Date Met:  01/02/14 Goal: Voiding-avoid urinary catheter unless indicated Outcome: Completed/Met Date Met:  01/02/14 Goal: Vital signs/hemodynamically stable Outcome: Completed/Met Date Met:  01/02/14 Goal: Other Phase I Outcomes/Goals Outcome: Completed/Met Date Met:  01/02/14

## 2014-01-02 NOTE — Progress Notes (Signed)
3 Days Post-Op  Subjective:  1 - Recurrent High Grade Bladder Cancer and Moderate Risk Prostate Cancer - s/p robotic cystoprostatectomy with cysto / ICG dye injection 12/30/2013. Path pending. Observed in stepdown POD 0 and transferred to 4th floor med-surg POD1.  Wound ostomy follow since pre-op admission.   2 - Ileus - NPO peri-op due to bowel anastomoses at surgery above. Started clears POD 2.  Feeling some rumbles in his abdomen, no formal flatus, denies nausea  3 - Disposition - No significant functional baseline deficits, lives independantly.  Working with PT, weak but able to participate  Today Heyward is w/o complaints. No nausea / emesis / wound problems.  Feeling some rumbles in his abdomen, no formal flatus.  Objective: Vital signs in last 24 hours: Temp:  [97.4 F (36.3 C)-98.9 F (37.2 C)] 97.4 F (36.3 C) (11/21 0556) Pulse Rate:  [83-92] 92 (11/21 0556) Resp:  [12-20] 14 (11/21 0800) BP: (136-164)/(65-91) 136/69 mmHg (11/21 0556) SpO2:  [95 %-98 %] 97 % (11/21 0800) Last BM Date: 12/29/13  Intake/Output from previous day: 11/20 0701 - 11/21 0700 In: 1370 [P.O.:120; I.V.:1200; IV Piggyback:50] Out: 5053 [Urine:3250; Drains:325] Intake/Output this shift:    General appearance: alert, cooperative and appears stated age Head: Normocephalic, without obvious abnormality, atraumatic Nose: Nares normal. Septum midline. Mucosa normal. No drainage or sinus tenderness. Throat: lips, mucosa, and tongue normal; teeth and gums normal Neck: supple, symmetrical, trachea midline Back: symmetric, no curvature. ROM normal. No CVA tenderness. Resp: non-labored on [place] O2 Chest wall: no tenderness Cardio: Nl rate GI: soft, non-tender; bowel sounds normal; no masses,  no organomegaly Male genitalia: normal, no spotting Extremities: extremities normal, atraumatic, no cyanosis or edema and SCD's in place  Pulses: 2+ and symmetric Skin: Skin color, texture, turgor normal. No rashes or  lesions Lymph nodes: Cervical, supraclavicular, and axillary nodes normal. Neurologic: Grossly normal Incision/Wound: Recent port and extraction sites c/d/i. RLQ Urostomy pink / patent with bander stents in place. JP with serosanguinous efflux.   Lab Results:   Recent Labs  12/30/13 1845 12/31/13 0354 01/01/14 0530 01/02/14 0355  WBC 14.7* 12.4*  --   --   HGB 14.0 12.3* 11.5* 11.8*  HCT 41.3 37.1* 35.6* 35.6*  PLT 293 255  --   --    BMET  Recent Labs  01/01/14 0530 01/02/14 0355  NA 135* 135*  K 4.5 4.1  CL 100 100  CO2 27 27  GLUCOSE 121* 100*  BUN 13 11  CREATININE 1.30 1.09  CALCIUM 8.3* 8.6   PT/INR No results for input(s): LABPROT, INR in the last 72 hours. ABG No results for input(s): PHART, HCO3 in the last 72 hours.  Invalid input(s): PCO2, PO2  Studies/Results: No results found.  Anti-infectives: Anti-infectives    Start     Dose/Rate Route Frequency Ordered Stop   12/30/13 2200  piperacillin-tazobactam (ZOSYN) IVPB 3.375 g  Status:  Discontinued     3.375 g100 mL/hr over 30 Minutes Intravenous 3 times per day 12/30/13 1757 12/30/13 1805   12/30/13 1830  piperacillin-tazobactam (ZOSYN) IVPB 3.375 g     3.375 g12.5 mL/hr over 240 Minutes Intravenous Every 8 hours 12/30/13 1807 01/01/14 1829   12/30/13 0900  piperacillin-tazobactam (ZOSYN) IVPB 3.375 g     3.375 g100 mL/hr over 30 Minutes Intravenous  Once 12/30/13 0838 12/30/13 0900      Assessment/Plan:  1 - Recurrent High Grade Bladder Cancer and Moderate Risk Prostate Cancer - path pending. Doing well  POD 3.  2 - Ileus - Abd not distended, no emesis,  begin clears today, cautioned to go slow if nausea develops.  3 - Disposition - Importance of ambulation reinforced. Hopefully staff can help him get up and do some more walking today.  Expect DC home likely next week.   Ardis Hughs 01/02/2014

## 2014-01-03 LAB — BASIC METABOLIC PANEL
Anion gap: 9 (ref 5–15)
BUN: 13 mg/dL (ref 6–23)
CO2: 28 mEq/L (ref 19–32)
Calcium: 8.7 mg/dL (ref 8.4–10.5)
Chloride: 101 mEq/L (ref 96–112)
Creatinine, Ser: 1.11 mg/dL (ref 0.50–1.35)
GFR calc Af Amer: 75 mL/min — ABNORMAL LOW (ref >90)
GFR, EST NON AFRICAN AMERICAN: 64 mL/min — AB (ref >90)
GLUCOSE: 112 mg/dL — AB (ref 70–99)
Potassium: 4.3 mEq/L (ref 3.7–5.3)
SODIUM: 138 meq/L (ref 137–147)

## 2014-01-03 LAB — HEMOGLOBIN AND HEMATOCRIT, BLOOD
HCT: 36.2 % — ABNORMAL LOW (ref 39.0–52.0)
Hemoglobin: 12.1 g/dL — ABNORMAL LOW (ref 13.0–17.0)

## 2014-01-03 NOTE — Progress Notes (Signed)
4 Days Post-Op Subjective: Patient reports he feels much better today. No specific complaints.  Objective: Vital signs in last 24 hours: Temp:  [97.9 F (36.6 C)-98.9 F (37.2 C)] 98.1 F (36.7 C) (11/22 0425) Pulse Rate:  [83-85] 83 (11/22 0425) Resp:  [12-21] 12 (11/22 0738) BP: (141-154)/(64-82) 141/64 mmHg (11/22 0425) SpO2:  [94 %-100 %] 94 % (11/22 0738)  Intake/Output from previous day: 11/21 0701 - 11/22 0700 In: 1240 [P.O.:240; I.V.:1000] Out: 2620 [Urine:2225; Drains:395] Intake/Output this shift:    Physical Exam:  Gen.: Patient sitting in chair watching TV Cardiovascular-regular rate and rhythm, no murmurs Pulmonary-clear to auscultation bilaterally Abdomen-incisions are clean dry and intact, JP with serosanguineous output. Right upper quadrant urostomy pink and viable. Urine clear. Lower extremity-no calf pain or swelling  Lab Results:  Recent Labs  01/01/14 0530 01/02/14 0355 01/03/14 0353  HGB 11.5* 11.8* 12.1*  HCT 35.6* 35.6* 36.2*   BMET  Recent Labs  01/02/14 0355 01/03/14 0353  NA 135* 138  K 4.1 4.3  CL 100 101  CO2 27 28  GLUCOSE 100* 112*  BUN 11 13  CREATININE 1.09 1.11  CALCIUM 8.6 8.7   No results for input(s): LABPT, INR in the last 72 hours. No results for input(s): LABURIN in the last 72 hours. Results for orders placed or performed during the hospital encounter of 12/29/13  Surgical pcr screen     Status: None   Collection Time: 12/30/13  5:57 AM  Result Value Ref Range Status   MRSA, PCR NEGATIVE NEGATIVE Final   Staphylococcus aureus NEGATIVE NEGATIVE Final    Comment:        The Xpert SA Assay (FDA approved for NASAL specimens in patients over 44 years of age), is one component of a comprehensive surveillance program.  Test performance has been validated by [employer] for patients greater than or equal to 87 year old. It is not intended to diagnose infection nor to guide or monitor treatment.      Studies/Results: No results found.  Assessment/Plan: Postop day #4-robot-assisted lap radical cystectomy with ileal conduit-patient stable and doing well. -Ambulate -Advanced to full liquid and possibly regular diet for dinner   LOS: 5 days   Festus Aloe 01/03/2014, 9:25 AM

## 2014-01-03 NOTE — Progress Notes (Signed)
Pt noted to be passing flatus

## 2014-01-03 NOTE — Plan of Care (Signed)
Problem: Phase II Progression Outcomes Goal: Pain controlled Outcome: Completed/Met Date Met:  01/03/14 Goal: Progress activity as tolerated unless otherwise ordered Outcome: Completed/Met Date Met:  01/03/14 Goal: Progressing with IS, TCDB Outcome: Completed/Met Date Met:  01/03/14 Goal: Vital signs stable Outcome: Completed/Met Date Met:  01/03/14 Goal: Surgical site without signs of infection Outcome: Completed/Met Date Met:  01/03/14 Goal: Dressings dry/intact Outcome: Completed/Met Date Met:  01/03/14 Goal: Return of bowel function (flatus, BM) IF ABDOMINAL SURGERY:  Outcome: Progressing Passing flatus Goal: Foley discontinued Outcome: Not Applicable Date Met:  11/64/35

## 2014-01-04 LAB — CREATININE, FLUID (PLEURAL, PERITONEAL, JP DRAINAGE): Creat, Fluid: 1.1 mg/dL

## 2014-01-04 MED ORDER — VANCOMYCIN HCL IN DEXTROSE 1-5 GM/200ML-% IV SOLN
1000.0000 mg | Freq: Once | INTRAVENOUS | Status: AC
  Administered 2014-01-04: 1000 mg via INTRAVENOUS
  Filled 2014-01-04: qty 200

## 2014-01-04 MED ORDER — OXYCODONE-ACETAMINOPHEN 5-325 MG PO TABS
1.0000 | ORAL_TABLET | ORAL | Status: DC | PRN
  Administered 2014-01-04 – 2014-01-06 (×7): 1 via ORAL
  Filled 2014-01-04 (×7): qty 1

## 2014-01-04 MED ORDER — HYDROMORPHONE HCL 1 MG/ML IJ SOLN
0.5000 mg | INTRAMUSCULAR | Status: DC | PRN
  Administered 2014-01-04 (×5): 0.5 mg via INTRAVENOUS
  Filled 2014-01-04 (×5): qty 1

## 2014-01-04 NOTE — Progress Notes (Signed)
Physical Therapy Treatment Patient Details Name: GERAD CORNELIO MRN: [id number] DOB: 1940/05/29 Today's Date: 01/04/2014    History of Present Illness 73 yo male s/p cystoprostatectomy, ileal conduit 12/30/13.     PT Comments    Progressing well with mobility. Pt able to ambulate entire east unit without use of walker.   Follow Up Recommendations  Home health PT     Equipment Recommendations  None recommended by PT    Recommendations for Other Services       Precautions / Restrictions Precautions Precautions: Fall Precaution Comments: abd surgery. drain  Restrictions Weight Bearing Restrictions: No    Mobility  Bed Mobility Overal bed mobility: Needs Assistance Bed Mobility: Supine to Sit     Supine to sit: HOB elevated;Min guard     General bed mobility comments: Increased time. Pt used bedrail  Transfers Overall transfer level: Needs assistance Equipment used: Rolling walker (2 wheeled) Transfers: Sit to/from Stand Sit to Stand: Min guard         General transfer comment: verbal cues for hand placement and safety, backing up to surface  Ambulation/Gait Ambulation/Gait assistance: Min guard Ambulation Distance (Feet): 350 Feet Assistive device: None Gait Pattern/deviations: Step-through pattern;Drifts right/left     General Gait Details: Progressed to ambulation without walker this session. Pt did well. Slightly unsteady at times but no overt LOB. Dyspnea 2/4 with ambulation-ambulated on RA. O2 sats 100% (checked once pt seated back in room)   Stairs            Wheelchair Mobility    Modified Rankin (Stroke Patients Only)       Balance Overall balance assessment: Needs assistance         Standing balance support: No upper extremity supported;During functional activity Standing balance-Leahy Scale: Good                      Cognition Arousal/Alertness: Awake/alert Behavior During Therapy: WFL for tasks  assessed/performed Overall Cognitive Status: Within Functional Limits for tasks assessed                      Exercises      General Comments        Pertinent Vitals/Pain Pain Assessment: No/denies pain    Home Living                      Prior Function            PT Goals (current goals can now be found in the care plan section) Progress towards PT goals: Progressing toward goals    Frequency  Min 3X/week    PT Plan Current plan remains appropriate    Co-evaluation             End of Session   Activity Tolerance: Patient tolerated treatment well Patient left: in chair;with call bell/phone within reach     Time: 1043-1055 PT Time Calculation (min) (ACUTE ONLY): 12 min  Charges:  $Gait Training: 8-22 mins                    G Codes:      Weston Anna, MPT Pager: [phone]

## 2014-01-04 NOTE — Progress Notes (Signed)
CARE MANAGEMENT NOTE 01/04/2014  Patient:  Jeremiah Wright, Jeremiah Wright   Account Number:  [id number]  Date Initiated:  12/30/2013  Documentation initiated by:  Dessa Phi  Subjective/Objective Assessment:   73 Y/O M ADMITTED W/BLADDER CA.     Action/Plan:   FROM HOME.   Anticipated DC Date:  01/06/2014   Anticipated DC Plan:  [place]  CM consult      Choice offered to / List presented to:  C-1 Patient           Status of service:  In process, will continue to follow Medicare Important Message given?  YES (If response is "NO", the following Medicare IM given date fields will be blank) Date Medicare IM given:  01/01/2014 Medicare IM given by:  [facility] Date Additional Medicare IM given:  01/04/2014 Additional Medicare IM given by:  [facility]  Discharge Disposition:    Per UR Regulation:  Reviewed for med. necessity/level of care/duration of stay  If discussed at Long Length of Stay Meetings, dates discussed:    Comments:  01/04/14 [facility] RN,BSN NCMN [phone] [place].TC KRISTEN AWARE OF REFERRAL.AWAIT FINAL HHRN/PT ORDERS.PATIENT ALREADY HAS RW.  01/01/14 Seann Genther RN,BSN NCM [facility] AGENCY LIST.AWAIT CHOICE.RECOMMEND HHRN-OSTOMY.  12/30/13 Milton Sagona RN,BSN NCM [phone] FOR CYSTOPROSTATECTOMY, & ILEAL CONDUIT.NO ANTICIPATED D/C NEEDS.

## 2014-01-04 NOTE — Progress Notes (Signed)
5 Days Post-Op  Subjective:  1 - Recurrent High Grade Bladder Cancer and Moderate Risk Prostate Cancer - s/p robotic cystoprostatectomy with cysto / ICG dye injection 12/30/2013. Path pending. Observed in stepdown POD 0 and transferred to 4th floor med-surg POD1.  Wound ostomy follow since pre-op admission.   2 - Ileus - NPO peri-op due to bowel anastomoses at surgery above. Started clears POD 2. Passing flatus and advanced to fulls POD 4.   3 - Disposition - No significant functional baseline deficits, lives independantly. PT eval recs home with HHPT, rolling walker.    Today Jeremiah Wright is w/o complaints. Tolerating diet with flatus, no nausea / emesis. Minimal PCA use.   Objective: Vital signs in last 24 hours: Temp:  [98.1 F (36.7 C)-98.4 F (36.9 C)] 98.4 F (36.9 C) (11/23 0428) Pulse Rate:  [83-86] 84 (11/23 0428) Resp:  [12-18] 18 (11/23 0536) BP: (127-155)/(67-89) 144/75 mmHg (11/23 0428) SpO2:  [94 %-99 %] 97 % (11/23 0536) Last BM Date: 12/29/13  Intake/Output from previous day: 11/22 0701 - 11/23 0700 In: 2710 [P.O.:1360; I.V.:1350] Out: 1105 [Urine:775; Drains:330] Intake/Output this shift: Total I/O In: 1960 [P.O.:1360; I.V.:600] Out: 770 [Urine:550; Drains:220]  General appearance: alert, cooperative and appears stated age Head: Normocephalic, without obvious abnormality, atraumatic Throat: lips, mucosa, and tongue normal; teeth and gums normal Neck: supple, symmetrical, trachea midline Back: symmetric, no curvature. ROM normal. No CVA tenderness. Resp: non-labored on [place] O2 Chest wall: no tenderness Cardio: Nl rate GI: soft, non-tender; bowel sounds normal; no masses,  no organomegaly Male genitalia: normal Extremities: extremities normal, atraumatic, no cyanosis or edema Pulses: 2+ and symmetric Skin: Skin color, texture, turgor normal. No rashes or lesions Lymph nodes: Cervical, supraclavicular, and axillary nodes normal. Neurologic: Grossly  normal Incision/Wound: Mild blanching erythema, no drainage.   Lab Results:   Recent Labs  01/02/14 0355 01/03/14 0353  HGB 11.8* 12.1*  HCT 35.6* 36.2*   BMET  Recent Labs  01/02/14 0355 01/03/14 0353  NA 135* 138  K 4.1 4.3  CL 100 101  CO2 27 28  GLUCOSE 100* 112*  BUN 11 13  CREATININE 1.09 1.11  CALCIUM 8.6 8.7   PT/INR No results for input(s): LABPROT, INR in the last 72 hours. ABG No results for input(s): PHART, HCO3 in the last 72 hours.  Invalid input(s): PCO2, PO2  Studies/Results: No results found.  Anti-infectives: Anti-infectives    Start     Dose/Rate Route Frequency Ordered Stop   01/04/14 0700  vancomycin (VANCOCIN) IVPB 1000 mg/200 mL premix     1,000 mg200 mL/hr over 60 Minutes Intravenous  Once 01/04/14 0644     12/30/13 2200  piperacillin-tazobactam (ZOSYN) IVPB 3.375 g  Status:  Discontinued     3.375 g100 mL/hr over 30 Minutes Intravenous 3 times per day 12/30/13 1757 12/30/13 1805   12/30/13 1830  piperacillin-tazobactam (ZOSYN) IVPB 3.375 g     3.375 g12.5 mL/hr over 240 Minutes Intravenous Every 8 hours 12/30/13 1807 01/01/14 1829   12/30/13 0900  piperacillin-tazobactam (ZOSYN) IVPB 3.375 g     3.375 g100 mL/hr over 30 Minutes Intravenous  Once 12/30/13 0838 12/30/13 0900      Assessment/Plan:  1 - Recurrent High Grade Bladder Cancer and Moderate Risk Prostate Cancer - path pending. Doing well POD 5. DC PCA, saline lock. 1gm Vanc x 1 for mild wound erythema. JP Cr today.   2 - Ileus - doing well on fulls, continue until BM, then regular.  3 - Disposition - Likely home mid week, likely Wednesday AM with HHPT and HHRN to reinforce new ostomy teaching.    [facility], Jeremiah Wright 01/04/2014

## 2014-01-05 MED ORDER — PANTOPRAZOLE SODIUM 40 MG PO TBEC
40.0000 mg | DELAYED_RELEASE_TABLET | Freq: Every day | ORAL | Status: DC
  Administered 2014-01-05: 40 mg via ORAL
  Filled 2014-01-05 (×2): qty 1

## 2014-01-05 NOTE — Plan of Care (Signed)
Problem: Phase II Progression Outcomes Goal: Return of bowel function (flatus, BM) IF ABDOMINAL SURGERY:  Outcome: Completed/Met Date Met:  01/05/14 Goal: Discharge plan established Outcome: Completed/Met Date Met:  01/05/14 Goal: Tolerating diet Outcome: Completed/Met Date Met:  01/05/14 Goal: Other Phase II Outcomes/Goals Outcome: Completed/Met Date Met:  01/05/14  Problem: Phase III Progression Outcomes Goal: Pain controlled on oral analgesia Outcome: Completed/Met Date Met:  01/05/14 Goal: Activity at appropriate level-compared to baseline (UP IN CHAIR FOR HEMODIALYSIS)  Outcome: Not Applicable Date Met:  67/25/50 Goal: Voiding independently Outcome: Completed/Met Date Met:  01/05/14 Goal: IV changed to normal saline lock Outcome: Progressing Goal: Discharge plan remains appropriate-arrangements made Outcome: Progressing Goal: Demonstrates TCDB, IS independently Outcome: Completed/Met Date Met:  01/05/14

## 2014-01-05 NOTE — Plan of Care (Signed)
Problem: Phase III Progression Outcomes Goal: IV changed to normal saline lock Outcome: Completed/Met Date Met:  01/05/14 Goal: Discharge plan remains appropriate-arrangements made Outcome: Completed/Met Date Met:  01/05/14 Goal: Other Phase III Outcomes/Goals Outcome: Completed/Met Date Met:  01/05/14     

## 2014-01-05 NOTE — Progress Notes (Signed)
6 Days Post-Op  Subjective:  1 - Recurrent High Grade Bladder Cancer and Moderate Risk Prostate Cancer - s/p robotic cystoprostatectomy with cysto / ICG dye injection 12/30/2013 for BCG-refracory pTISN0M0 cancer.  Observed in stepdown POD 0 and transferred to 4th floor med-surg POD1.  Wound ostomy follow since pre-op admission. JP removed POD 5 as output minimal and Cr same as serum.  2 - Ileus - NPO peri-op due to bowel anastomoses at surgery above. Started clears POD 2. Passing flatus and advanced to fulls POD 4 and Regular POD 5  As bowel functio completely resumed.   3 - Disposition - No significant functional baseline deficits, lives independantly. PT eval recs home with HHPT, also HHRN for new ostomy teaching continuation.   Today Jeremiah Wright is w/o complaints. Had BM x 2 yesterday and now tollerating regular diet.   Objective: Vital signs in last 24 hours: Temp:  [98.4 F (36.9 C)-98.6 F (37 C)] 98.6 F (37 C) (11/24 0525) Pulse Rate:  [76-89] 76 (11/24 0525) Resp:  [18] 18 (11/24 0525) BP: (134-141)/(75-81) 141/81 mmHg (11/24 0525) SpO2:  [93 %-98 %] 93 % (11/24 0525) Last BM Date: 01/05/14  Intake/Output from previous day: 11/23 0701 - 11/24 0700 In: 360 [P.O.:360] Out: 950 [Urine:700; Drains:250] Intake/Output this shift:    General appearance: alert, cooperative, appears stated age and family at bedside Nose: Nares normal. Septum midline. Mucosa normal. No drainage or sinus tenderness. Throat: lips, mucosa, and tongue normal; teeth and gums normal Neck: supple, symmetrical, trachea midline Back: symmetric, no curvature. ROM normal. No CVA tenderness. Resp: non-labored on room air Cardio: Nl rate GI: soft, non-tender; bowel sounds normal; no masses,  no organomegaly Male genitalia: normal Extremities: extremities normal, atraumatic, no cyanosis or edema Pulses: 2+ and symmetric Skin: Skin color, texture, turgor normal. No rashes or lesions Lymph nodes: Cervical,  supraclavicular, and axillary nodes normal. Neurologic: Grossly normal Incision/Wound: Recent urosomy pink and patent with Bander stents in situ. Incision sites c/d/i. JP removed and dry dressing applied.   Lab Results:   Recent Labs  01/03/14 0353  HGB 12.1*  HCT 36.2*   BMET  Recent Labs  01/03/14 0353  NA 138  K 4.3  CL 101  CO2 28  GLUCOSE 112*  BUN 13  CREATININE 1.11  CALCIUM 8.7   PT/INR No results for input(s): LABPROT, INR in the last 72 hours. ABG No results for input(s): PHART, HCO3 in the last 72 hours.  Invalid input(s): PCO2, PO2  Studies/Results: No results found.  Anti-infectives: Anti-infectives    Start     Dose/Rate Route Frequency Ordered Stop   01/04/14 0700  vancomycin (VANCOCIN) IVPB 1000 mg/200 mL premix     1,000 mg200 mL/hr over 60 Minutes Intravenous  Once 01/04/14 0644 01/04/14 0855   12/30/13 2200  piperacillin-tazobactam (ZOSYN) IVPB 3.375 g  Status:  Discontinued     3.375 g100 mL/hr over 30 Minutes Intravenous 3 times per day 12/30/13 1757 12/30/13 1805   12/30/13 1830  piperacillin-tazobactam (ZOSYN) IVPB 3.375 g     3.375 g12.5 mL/hr over 240 Minutes Intravenous Every 8 hours 12/30/13 1807 01/01/14 1829   12/30/13 0900  piperacillin-tazobactam (ZOSYN) IVPB 3.375 g     3.375 g100 mL/hr over 30 Minutes Intravenous  Once 12/30/13 0838 12/30/13 0900      Assessment/Plan:  1 - Recurrent High Grade Bladder Cancer and Moderate Risk Prostate Cancer - discussed favorable path report with pt and family. JP out today.   2 -  Ileus - doing well  On regular diet.   3 - Disposition - plan for DC home tomorrow AM, he is meeting goals.   [facility], Samyra Limb 01/05/2014

## 2014-01-05 NOTE — Progress Notes (Signed)
Physical Therapy Treatment Patient Details Name: Jeremiah Wright MRN: [id number] DOB: 04/21/1940 Today's Date: 01/05/2014    History of Present Illness 73 yo male s/p cystoprostatectomy, ileal conduit 12/30/13.     PT Comments    Continuing to progress well with mobility. Walked hallway and practiced stair negotiation. Pt plans to d/c home on tomorrow.   Follow Up Recommendations  Home health PT     Equipment Recommendations  None recommended by PT    Recommendations for Other Services       Precautions / Restrictions Precautions Precautions: Fall Precaution Comments: abd surgery. drain  Restrictions Weight Bearing Restrictions: No    Mobility  Bed Mobility   Bed Mobility: Supine to Sit     Supine to sit: Supervision;HOB elevated     General bed mobility comments: Increased time. Pt used bedrail  Transfers Overall transfer level: Needs assistance   Transfers: Sit to/from Stand Sit to Stand: Supervision            Ambulation/Gait Ambulation/Gait assistance: Supervision Ambulation Distance (Feet): 300 Feet Assistive device: None Gait Pattern/deviations: Step-through pattern         Stairs Stairs: Yes Stairs assistance: Supervision Stair Management: One rail Left;Forwards;Alternating pattern Number of Stairs: 6 General stair comments: VCs for pt to take one step at  time if he feels he needs to.   Wheelchair Mobility    Modified Rankin (Stroke Patients Only)       Balance                                    Cognition Arousal/Alertness: Awake/alert Behavior During Therapy: WFL for tasks assessed/performed Overall Cognitive Status: Within Functional Limits for tasks assessed                      Exercises      General Comments        Pertinent Vitals/Pain Pain Assessment: 0-10 Pain Score: 3  Pain Location: abdomen Pain Intervention(s): Monitored during session    Home Living                       Prior Function            PT Goals (current goals can now be found in the care plan section) Progress towards PT goals: Progressing toward goals    Frequency  Min 3X/week    PT Plan Current plan remains appropriate    Co-evaluation             End of Session   Activity Tolerance: Patient tolerated treatment well Patient left: in chair;with call bell/phone within reach     Time: 5176-1607 PT Time Calculation (min) (ACUTE ONLY): 8 min  Charges:  $Gait Training: 8-22 mins                    G Codes:      Weston Anna, MPT Pager: [phone]

## 2014-01-05 NOTE — Consult Note (Signed)
WOC ostomy follow up Enrolled patient in [place] Start Discharge program: Yes [place] nursing team will continue to follow, and will remain available to this patient, the nursing and medical teams.   Thanks, Maudie Flakes, MSN, RN, [place], [place], [place] [phone])

## 2014-01-06 MED ORDER — OXYCODONE-ACETAMINOPHEN 5-325 MG PO TABS: 1.0000 | ORAL_TABLET | Freq: Four times a day (QID) | ORAL | Status: DC | PRN

## 2014-01-06 MED ORDER — SENNOSIDES-DOCUSATE SODIUM 8.6-50 MG PO TABS
1.0000 | ORAL_TABLET | Freq: Two times a day (BID) | ORAL | Status: DC
Start: 2014-01-06 — End: 2018-01-06

## 2014-01-06 MED ORDER — SULFAMETHOXAZOLE-TRIMETHOPRIM 800-160 MG PO TABS: 1.0000 | ORAL_TABLET | Freq: Two times a day (BID) | ORAL | Status: DC

## 2014-01-06 NOTE — Consult Note (Signed)
WOC ostomy follow up Stoma type/location: RLQ urostomy. Wife and daughter participated in second teaching session. Stomal assessment/size: 1 and 1/8 inch round when gently traction placed on skin in 12 o'clock position.  Slightly oval at rest.  Healing partial thickness tissue loss from medical adhesive skin injury (MARSI) at 1 o'clock. Peristomal assessment: intact except for MARSI noted above. Treatment options for stomal/peristomal skin: None.  Covered with pouching system Output: yellow urine Ostomy pouching: 1pc.pouching system with convexity Education provided: Patient and family provided with lengthy visit to reinforce all previous teaching, answer questions, demonstrate pouching system change and review nighttime drainage system, emptying and changing schedules.  Patient is established with [place]. Enrolled patient in [place] Start Discharge program: Yes Patient for discharge later today.  Ready from a [place] with return to home with family and [facility] support and follow up with Dr. Tresa Moore as he has indicated. Thanks, Maudie Flakes, MSN, RN, [place], [place], [place] [phone])

## 2014-01-06 NOTE — Discharge Summary (Signed)
Physician Discharge Summary  Patient ID: Jeremiah Wright MRN: [id number] DOB/AGE: 1940/12/23 73 y.o.  Admit date: 12/29/2013 Discharge date: 01/06/2014  Admission Diagnoses:  Discharge Diagnoses:  Active Problems:   Bladder cancer Prostate Cancer  Discharged Condition: good  Hospital Course:   1 - Recurrent High Grade Bladder Cancer and Moderate Risk Prostate Cancer - s/p robotic cystoprostatectomy with cysto / ICG dye injection 12/30/2013 for BCG-refracory pTISN0M0 cancer. Observed in stepdown POD 0 and transferred to 4th floor med-surg POD1. Wound ostomy follow since pre-op admission. JP removed POD 5 as output minimal and Cr same as serum.  2 - Ileus - NPO peri-op due to bowel anastomoses at surgery above. Started clears POD 2. Passing flatus and advanced to fulls POD 4 and Regular POD 5 As bowel functio completely resumed.   3 - Disposition - No significant functional baseline deficits, lives independantly. PT eval recs home with HHPT, also HHRN for new ostomy teaching continuation.  By POD 6, the day of discharge, pt is ambulatory, pain controlled on PO meds, having resumed bowel function and felt to be adequate for discharge.    Consults: PT, Wound Ostomy RN,   Significant Diagnostic Studies: labs: as per above  Treatments: surgery: robotic cystoprostatectomy with cysto / ICG dye injection 12/30/2013  Discharge Exam: Blood pressure 115/65, pulse 77, temperature 98.4 F (36.9 C), temperature source Oral, resp. rate 20, height 5\' 6"  (1.676 m), weight 94.9 kg (209 lb 3.5 oz), SpO2 94 %. General appearance: alert, cooperative and appears stated age Head: Normocephalic, without obvious abnormality, atraumatic Nose: Nares normal. Septum midline. Mucosa normal. No drainage or sinus tenderness. Throat: lips, mucosa, and tongue normal; teeth and gums normal Neck: supple, symmetrical, trachea midline Back: symmetric, no curvature. ROM normal. No CVA tenderness. Resp: non-labored  on room air Cardio: Nl rate GI: soft, non-tender; bowel sounds normal; no masses,  no organomegaly Pelvic: No penile discharge Pulses: 2+ and symmetric Skin: Skin color, texture, turgor normal. No rashes or lesions Lymph nodes: Cervical, supraclavicular, and axillary nodes normal. Neurologic: Grossly normal Incision/Wound: Recent incision sites c/d/i. RLQ Urostomy pink / patent with bander stents in situ.   Disposition: 01-Home or Self Care     Medication List    ASK your doctor about these medications        ciprofloxacin 500 MG tablet  Commonly known as:  CIPRO  Take 1 tablet (500 mg total) by mouth 2 (two) times daily.     finasteride 5 MG tablet  Commonly known as:  PROSCAR  Take 5 mg by mouth every morning.     hydrocortisone cream 1 %  Apply 1 application topically 2 (two) times daily as needed for itching.     omeprazole 20 MG capsule  Commonly known as:  PRILOSEC  Take 20 mg by mouth every morning.     Oxycodone HCl 10 MG Tabs  Take 10 mg by mouth every 6 (six) hours as needed (Pain).     phenazopyridine 200 MG tablet  Commonly known as:  PYRIDIUM  Take 200 mg by mouth 3 (three) times daily as needed for pain.     phenazopyridine 200 MG tablet  Commonly known as:  PYRIDIUM  - Take 1 tablet (200 mg total) by mouth 3 (three) times daily as needed for pain. For urinary burning  - Note: will stain clothing     tamsulosin 0.4 MG Caps capsule  Commonly known as:  FLOMAX  Take 1 capsule (0.4 mg total) by mouth  daily.     traMADol 50 MG tablet  Commonly known as:  ULTRAM  Take 50 mg by mouth every 6 (six) hours as needed for moderate pain.     traMADol-acetaminophen 37.5-325 MG per tablet  Commonly known as:  ULTRACET  Take 1 tablet by mouth every 6 (six) hours as needed.     trimethoprim 100 MG tablet  Commonly known as:  TRIMPEX  Take 100 mg by mouth every morning.     valsartan 320 MG tablet  Commonly known as:  DIOVAN  Take 160 mg by mouth every  morning.     zolpidem 10 MG tablet  Commonly known as:  AMBIEN  Take 5-10 mg by mouth at bedtime as needed for sleep.         SignedAlexis Frock 01/06/2014, 8:19 AM

## 2014-01-06 NOTE — Discharge Instructions (Signed)

## 2014-02-25 ENCOUNTER — Encounter (HOSPITAL_COMMUNITY): Payer: Self-pay | Admitting: Urology

## 2014-06-21 ENCOUNTER — Ambulatory Visit (HOSPITAL_COMMUNITY)
Admission: RE | Admit: 2014-06-21 | Discharge: 2014-06-21 | Disposition: A | Discharge status: Home / Self Care | Payer: PPO | Source: Ambulatory Visit | Attending: Urology | Admitting: Urology

## 2014-06-21 ENCOUNTER — Other Ambulatory Visit (HOSPITAL_COMMUNITY): Payer: Self-pay | Admitting: Urology

## 2014-06-21 DIAGNOSIS — C679 Malignant neoplasm of bladder, unspecified: Secondary | ICD-10-CM | POA: Insufficient documentation

## 2014-06-21 DIAGNOSIS — C670 Malignant neoplasm of trigone of bladder: Secondary | ICD-10-CM

## 2015-01-04 ENCOUNTER — Other Ambulatory Visit: Payer: Self-pay | Admitting: Urology

## 2015-01-04 ENCOUNTER — Ambulatory Visit (HOSPITAL_COMMUNITY)
Admission: RE | Admit: 2015-01-04 | Discharge: 2015-01-04 | Disposition: A | Discharge status: Home / Self Care | Payer: PPO | Source: Ambulatory Visit | Attending: Urology | Admitting: Urology

## 2015-01-04 DIAGNOSIS — C670 Malignant neoplasm of trigone of bladder: Secondary | ICD-10-CM

## 2015-01-04 DIAGNOSIS — R911 Solitary pulmonary nodule: Secondary | ICD-10-CM | POA: Insufficient documentation

## 2015-02-18 DIAGNOSIS — G8929 Other chronic pain: Secondary | ICD-10-CM | POA: Diagnosis not present

## 2015-02-18 DIAGNOSIS — M25511 Pain in right shoulder: Secondary | ICD-10-CM | POA: Diagnosis not present

## 2015-02-18 DIAGNOSIS — M25512 Pain in left shoulder: Secondary | ICD-10-CM | POA: Diagnosis not present

## 2015-02-18 DIAGNOSIS — Z5181 Encounter for therapeutic drug level monitoring: Secondary | ICD-10-CM | POA: Diagnosis not present

## 2015-02-18 DIAGNOSIS — F1120 Opioid dependence, uncomplicated: Secondary | ICD-10-CM | POA: Diagnosis not present

## 2015-02-18 DIAGNOSIS — C679 Malignant neoplasm of bladder, unspecified: Secondary | ICD-10-CM | POA: Diagnosis not present

## 2015-02-18 DIAGNOSIS — I10 Essential (primary) hypertension: Secondary | ICD-10-CM | POA: Diagnosis not present

## 2015-02-24 DIAGNOSIS — Z936 Other artificial openings of urinary tract status: Secondary | ICD-10-CM | POA: Diagnosis not present

## 2015-02-24 DIAGNOSIS — Z8551 Personal history of malignant neoplasm of bladder: Secondary | ICD-10-CM | POA: Diagnosis not present

## 2015-03-21 DIAGNOSIS — M129 Arthropathy, unspecified: Secondary | ICD-10-CM | POA: Diagnosis not present

## 2015-03-21 DIAGNOSIS — I10 Essential (primary) hypertension: Secondary | ICD-10-CM | POA: Diagnosis not present

## 2015-03-21 DIAGNOSIS — M25569 Pain in unspecified knee: Secondary | ICD-10-CM | POA: Diagnosis not present

## 2015-03-21 DIAGNOSIS — F419 Anxiety disorder, unspecified: Secondary | ICD-10-CM | POA: Diagnosis not present

## 2015-03-28 DIAGNOSIS — M129 Arthropathy, unspecified: Secondary | ICD-10-CM | POA: Diagnosis not present

## 2015-03-28 DIAGNOSIS — F419 Anxiety disorder, unspecified: Secondary | ICD-10-CM | POA: Diagnosis not present

## 2015-03-28 DIAGNOSIS — I10 Essential (primary) hypertension: Secondary | ICD-10-CM | POA: Diagnosis not present

## 2015-03-31 DIAGNOSIS — Z936 Other artificial openings of urinary tract status: Secondary | ICD-10-CM | POA: Diagnosis not present

## 2015-03-31 DIAGNOSIS — Z8551 Personal history of malignant neoplasm of bladder: Secondary | ICD-10-CM | POA: Diagnosis not present

## 2015-05-11 DIAGNOSIS — Z936 Other artificial openings of urinary tract status: Secondary | ICD-10-CM | POA: Diagnosis not present

## 2015-05-11 DIAGNOSIS — Z8551 Personal history of malignant neoplasm of bladder: Secondary | ICD-10-CM | POA: Diagnosis not present

## 2015-06-17 DIAGNOSIS — Z8551 Personal history of malignant neoplasm of bladder: Secondary | ICD-10-CM | POA: Diagnosis not present

## 2015-06-17 DIAGNOSIS — Z936 Other artificial openings of urinary tract status: Secondary | ICD-10-CM | POA: Diagnosis not present

## 2015-06-29 DIAGNOSIS — R911 Solitary pulmonary nodule: Secondary | ICD-10-CM | POA: Diagnosis not present

## 2015-06-29 DIAGNOSIS — Z932 Ileostomy status: Secondary | ICD-10-CM | POA: Diagnosis not present

## 2015-06-29 DIAGNOSIS — C61 Malignant neoplasm of prostate: Secondary | ICD-10-CM | POA: Diagnosis not present

## 2015-06-29 DIAGNOSIS — K8020 Calculus of gallbladder without cholecystitis without obstruction: Secondary | ICD-10-CM | POA: Diagnosis not present

## 2015-06-29 DIAGNOSIS — C670 Malignant neoplasm of trigone of bladder: Secondary | ICD-10-CM | POA: Diagnosis not present

## 2015-07-05 DIAGNOSIS — R911 Solitary pulmonary nodule: Secondary | ICD-10-CM | POA: Diagnosis not present

## 2015-07-05 DIAGNOSIS — Z932 Ileostomy status: Secondary | ICD-10-CM | POA: Diagnosis not present

## 2015-07-05 DIAGNOSIS — C61 Malignant neoplasm of prostate: Secondary | ICD-10-CM | POA: Diagnosis not present

## 2015-07-05 DIAGNOSIS — C670 Malignant neoplasm of trigone of bladder: Secondary | ICD-10-CM | POA: Diagnosis not present

## 2015-07-12 DIAGNOSIS — Z8551 Personal history of malignant neoplasm of bladder: Secondary | ICD-10-CM | POA: Diagnosis not present

## 2015-07-12 DIAGNOSIS — Z936 Other artificial openings of urinary tract status: Secondary | ICD-10-CM | POA: Diagnosis not present

## 2015-07-18 DIAGNOSIS — I10 Essential (primary) hypertension: Secondary | ICD-10-CM | POA: Diagnosis not present

## 2015-07-25 DIAGNOSIS — R739 Hyperglycemia, unspecified: Secondary | ICD-10-CM | POA: Diagnosis not present

## 2015-07-25 DIAGNOSIS — Z23 Encounter for immunization: Secondary | ICD-10-CM | POA: Diagnosis not present

## 2015-07-25 DIAGNOSIS — C679 Malignant neoplasm of bladder, unspecified: Secondary | ICD-10-CM | POA: Diagnosis not present

## 2015-07-25 DIAGNOSIS — I10 Essential (primary) hypertension: Secondary | ICD-10-CM | POA: Diagnosis not present

## 2015-07-25 DIAGNOSIS — E7800 Pure hypercholesterolemia, unspecified: Secondary | ICD-10-CM | POA: Diagnosis not present

## 2015-08-05 DIAGNOSIS — Z936 Other artificial openings of urinary tract status: Secondary | ICD-10-CM | POA: Diagnosis not present

## 2015-08-05 DIAGNOSIS — Z8551 Personal history of malignant neoplasm of bladder: Secondary | ICD-10-CM | POA: Diagnosis not present

## 2015-08-22 DIAGNOSIS — Z936 Other artificial openings of urinary tract status: Secondary | ICD-10-CM | POA: Diagnosis not present

## 2015-08-22 DIAGNOSIS — Z8551 Personal history of malignant neoplasm of bladder: Secondary | ICD-10-CM | POA: Diagnosis not present

## 2015-09-20 DIAGNOSIS — Z936 Other artificial openings of urinary tract status: Secondary | ICD-10-CM | POA: Diagnosis not present

## 2015-09-20 DIAGNOSIS — Z8551 Personal history of malignant neoplasm of bladder: Secondary | ICD-10-CM | POA: Diagnosis not present

## 2015-10-18 DIAGNOSIS — Z8551 Personal history of malignant neoplasm of bladder: Secondary | ICD-10-CM | POA: Diagnosis not present

## 2015-10-18 DIAGNOSIS — Z936 Other artificial openings of urinary tract status: Secondary | ICD-10-CM | POA: Diagnosis not present

## 2015-12-02 DIAGNOSIS — Z8551 Personal history of malignant neoplasm of bladder: Secondary | ICD-10-CM | POA: Diagnosis not present

## 2015-12-02 DIAGNOSIS — Z936 Other artificial openings of urinary tract status: Secondary | ICD-10-CM | POA: Diagnosis not present

## 2015-12-27 DIAGNOSIS — C61 Malignant neoplasm of prostate: Secondary | ICD-10-CM | POA: Diagnosis not present

## 2015-12-29 DIAGNOSIS — K8020 Calculus of gallbladder without cholecystitis without obstruction: Secondary | ICD-10-CM | POA: Diagnosis not present

## 2015-12-29 DIAGNOSIS — C670 Malignant neoplasm of trigone of bladder: Secondary | ICD-10-CM | POA: Diagnosis not present

## 2016-01-03 DIAGNOSIS — C61 Malignant neoplasm of prostate: Secondary | ICD-10-CM | POA: Diagnosis not present

## 2016-01-03 DIAGNOSIS — Z8551 Personal history of malignant neoplasm of bladder: Secondary | ICD-10-CM | POA: Diagnosis not present

## 2016-01-06 DIAGNOSIS — Z8551 Personal history of malignant neoplasm of bladder: Secondary | ICD-10-CM | POA: Diagnosis not present

## 2016-01-06 DIAGNOSIS — Z936 Other artificial openings of urinary tract status: Secondary | ICD-10-CM | POA: Diagnosis not present

## 2016-01-27 DIAGNOSIS — Z8551 Personal history of malignant neoplasm of bladder: Secondary | ICD-10-CM | POA: Diagnosis not present

## 2016-01-27 DIAGNOSIS — Z936 Other artificial openings of urinary tract status: Secondary | ICD-10-CM | POA: Diagnosis not present

## 2016-03-20 DIAGNOSIS — Z936 Other artificial openings of urinary tract status: Secondary | ICD-10-CM | POA: Diagnosis not present

## 2016-03-20 DIAGNOSIS — C670 Malignant neoplasm of trigone of bladder: Secondary | ICD-10-CM | POA: Diagnosis not present

## 2016-04-03 DIAGNOSIS — R7309 Other abnormal glucose: Secondary | ICD-10-CM | POA: Diagnosis not present

## 2016-04-03 DIAGNOSIS — E7800 Pure hypercholesterolemia, unspecified: Secondary | ICD-10-CM | POA: Diagnosis not present

## 2016-04-03 DIAGNOSIS — I10 Essential (primary) hypertension: Secondary | ICD-10-CM | POA: Diagnosis not present

## 2016-04-04 DIAGNOSIS — H40023 Open angle with borderline findings, high risk, bilateral: Secondary | ICD-10-CM | POA: Diagnosis not present

## 2016-04-04 DIAGNOSIS — H25013 Cortical age-related cataract, bilateral: Secondary | ICD-10-CM | POA: Diagnosis not present

## 2016-04-04 DIAGNOSIS — H18413 Arcus senilis, bilateral: Secondary | ICD-10-CM | POA: Diagnosis not present

## 2016-04-04 DIAGNOSIS — H25043 Posterior subcapsular polar age-related cataract, bilateral: Secondary | ICD-10-CM | POA: Diagnosis not present

## 2016-04-04 DIAGNOSIS — H52223 Regular astigmatism, bilateral: Secondary | ICD-10-CM | POA: Diagnosis not present

## 2016-04-04 DIAGNOSIS — H04123 Dry eye syndrome of bilateral lacrimal glands: Secondary | ICD-10-CM | POA: Diagnosis not present

## 2016-04-04 DIAGNOSIS — H2513 Age-related nuclear cataract, bilateral: Secondary | ICD-10-CM | POA: Diagnosis not present

## 2016-04-04 DIAGNOSIS — H11423 Conjunctival edema, bilateral: Secondary | ICD-10-CM | POA: Diagnosis not present

## 2016-04-04 DIAGNOSIS — H11153 Pinguecula, bilateral: Secondary | ICD-10-CM | POA: Diagnosis not present

## 2016-04-04 DIAGNOSIS — H5203 Hypermetropia, bilateral: Secondary | ICD-10-CM | POA: Diagnosis not present

## 2016-04-10 DIAGNOSIS — Z0000 Encounter for general adult medical examination without abnormal findings: Secondary | ICD-10-CM | POA: Diagnosis not present

## 2016-04-10 DIAGNOSIS — M25519 Pain in unspecified shoulder: Secondary | ICD-10-CM | POA: Diagnosis not present

## 2016-04-10 DIAGNOSIS — I10 Essential (primary) hypertension: Secondary | ICD-10-CM | POA: Diagnosis not present

## 2016-04-10 DIAGNOSIS — Z5181 Encounter for therapeutic drug level monitoring: Secondary | ICD-10-CM | POA: Diagnosis not present

## 2016-04-10 DIAGNOSIS — R7309 Other abnormal glucose: Secondary | ICD-10-CM | POA: Diagnosis not present

## 2016-04-10 DIAGNOSIS — E7800 Pure hypercholesterolemia, unspecified: Secondary | ICD-10-CM | POA: Diagnosis not present

## 2016-04-23 DIAGNOSIS — H25813 Combined forms of age-related cataract, bilateral: Secondary | ICD-10-CM | POA: Diagnosis not present

## 2016-04-23 DIAGNOSIS — H40013 Open angle with borderline findings, low risk, bilateral: Secondary | ICD-10-CM | POA: Diagnosis not present

## 2016-04-23 DIAGNOSIS — H01025 Squamous blepharitis left lower eyelid: Secondary | ICD-10-CM | POA: Diagnosis not present

## 2016-04-23 DIAGNOSIS — H01024 Squamous blepharitis left upper eyelid: Secondary | ICD-10-CM | POA: Diagnosis not present

## 2016-04-23 DIAGNOSIS — H01022 Squamous blepharitis right lower eyelid: Secondary | ICD-10-CM | POA: Diagnosis not present

## 2016-04-23 DIAGNOSIS — H01021 Squamous blepharitis right upper eyelid: Secondary | ICD-10-CM | POA: Diagnosis not present

## 2016-05-17 DIAGNOSIS — H268 Other specified cataract: Secondary | ICD-10-CM | POA: Diagnosis not present

## 2016-05-17 DIAGNOSIS — H2512 Age-related nuclear cataract, left eye: Secondary | ICD-10-CM | POA: Diagnosis not present

## 2016-06-11 DIAGNOSIS — H2511 Age-related nuclear cataract, right eye: Secondary | ICD-10-CM | POA: Diagnosis not present

## 2016-06-14 DIAGNOSIS — H2511 Age-related nuclear cataract, right eye: Secondary | ICD-10-CM | POA: Diagnosis not present

## 2016-06-14 DIAGNOSIS — H268 Other specified cataract: Secondary | ICD-10-CM | POA: Diagnosis not present

## 2016-12-17 ENCOUNTER — Other Ambulatory Visit: Payer: Self-pay | Admitting: Urology

## 2016-12-17 ENCOUNTER — Ambulatory Visit (HOSPITAL_COMMUNITY)
Admission: RE | Admit: 2016-12-17 | Discharge: 2016-12-17 | Disposition: A | Discharge status: Home / Self Care | Payer: PPO | Source: Ambulatory Visit | Attending: Urology | Admitting: Urology

## 2016-12-17 DIAGNOSIS — C670 Malignant neoplasm of trigone of bladder: Secondary | ICD-10-CM

## 2016-12-17 DIAGNOSIS — C679 Malignant neoplasm of bladder, unspecified: Secondary | ICD-10-CM | POA: Diagnosis not present

## 2016-12-20 DIAGNOSIS — C679 Malignant neoplasm of bladder, unspecified: Secondary | ICD-10-CM | POA: Diagnosis not present

## 2016-12-20 DIAGNOSIS — C670 Malignant neoplasm of trigone of bladder: Secondary | ICD-10-CM | POA: Diagnosis not present

## 2016-12-24 DIAGNOSIS — C61 Malignant neoplasm of prostate: Secondary | ICD-10-CM | POA: Diagnosis not present

## 2016-12-24 DIAGNOSIS — C670 Malignant neoplasm of trigone of bladder: Secondary | ICD-10-CM | POA: Diagnosis not present

## 2017-02-18 DIAGNOSIS — H04123 Dry eye syndrome of bilateral lacrimal glands: Secondary | ICD-10-CM | POA: Diagnosis not present

## 2017-02-18 DIAGNOSIS — Z961 Presence of intraocular lens: Secondary | ICD-10-CM | POA: Diagnosis not present

## 2017-02-18 DIAGNOSIS — I10 Essential (primary) hypertension: Secondary | ICD-10-CM | POA: Diagnosis not present

## 2017-02-18 DIAGNOSIS — H18413 Arcus senilis, bilateral: Secondary | ICD-10-CM | POA: Diagnosis not present

## 2017-02-18 DIAGNOSIS — H40023 Open angle with borderline findings, high risk, bilateral: Secondary | ICD-10-CM | POA: Diagnosis not present

## 2017-02-18 DIAGNOSIS — H3589 Other specified retinal disorders: Secondary | ICD-10-CM | POA: Diagnosis not present

## 2017-02-18 DIAGNOSIS — H11153 Pinguecula, bilateral: Secondary | ICD-10-CM | POA: Diagnosis not present

## 2017-02-18 DIAGNOSIS — H40003 Preglaucoma, unspecified, bilateral: Secondary | ICD-10-CM | POA: Diagnosis not present

## 2017-02-19 DIAGNOSIS — Z23 Encounter for immunization: Secondary | ICD-10-CM | POA: Diagnosis not present

## 2017-03-19 DIAGNOSIS — M25562 Pain in left knee: Secondary | ICD-10-CM | POA: Diagnosis not present

## 2017-03-19 DIAGNOSIS — M1712 Unilateral primary osteoarthritis, left knee: Secondary | ICD-10-CM | POA: Diagnosis not present

## 2017-04-08 DIAGNOSIS — M1712 Unilateral primary osteoarthritis, left knee: Secondary | ICD-10-CM | POA: Diagnosis not present

## 2017-04-15 DIAGNOSIS — M1712 Unilateral primary osteoarthritis, left knee: Secondary | ICD-10-CM | POA: Diagnosis not present

## 2017-04-22 DIAGNOSIS — M1712 Unilateral primary osteoarthritis, left knee: Secondary | ICD-10-CM | POA: Diagnosis not present

## 2017-08-06 DIAGNOSIS — M129 Arthropathy, unspecified: Secondary | ICD-10-CM | POA: Diagnosis not present

## 2017-08-06 DIAGNOSIS — M25562 Pain in left knee: Secondary | ICD-10-CM | POA: Diagnosis not present

## 2017-08-06 DIAGNOSIS — Z0000 Encounter for general adult medical examination without abnormal findings: Secondary | ICD-10-CM | POA: Diagnosis not present

## 2017-08-06 DIAGNOSIS — I10 Essential (primary) hypertension: Secondary | ICD-10-CM | POA: Diagnosis not present

## 2017-08-06 DIAGNOSIS — E7800 Pure hypercholesterolemia, unspecified: Secondary | ICD-10-CM | POA: Diagnosis not present

## 2017-08-06 DIAGNOSIS — W57XXXA Bitten or stung by nonvenomous insect and other nonvenomous arthropods, initial encounter: Secondary | ICD-10-CM | POA: Diagnosis not present

## 2017-08-14 DIAGNOSIS — M25562 Pain in left knee: Secondary | ICD-10-CM | POA: Diagnosis not present

## 2017-08-14 DIAGNOSIS — Z5181 Encounter for therapeutic drug level monitoring: Secondary | ICD-10-CM | POA: Diagnosis not present

## 2017-08-14 DIAGNOSIS — I10 Essential (primary) hypertension: Secondary | ICD-10-CM | POA: Diagnosis not present

## 2017-08-14 DIAGNOSIS — Z0000 Encounter for general adult medical examination without abnormal findings: Secondary | ICD-10-CM | POA: Diagnosis not present

## 2017-08-14 DIAGNOSIS — N390 Urinary tract infection, site not specified: Secondary | ICD-10-CM | POA: Diagnosis not present

## 2017-08-20 DIAGNOSIS — Z936 Other artificial openings of urinary tract status: Secondary | ICD-10-CM | POA: Diagnosis not present

## 2017-08-28 DIAGNOSIS — R739 Hyperglycemia, unspecified: Secondary | ICD-10-CM | POA: Diagnosis not present

## 2017-08-28 DIAGNOSIS — L570 Actinic keratosis: Secondary | ICD-10-CM | POA: Diagnosis not present

## 2017-08-28 DIAGNOSIS — I10 Essential (primary) hypertension: Secondary | ICD-10-CM | POA: Diagnosis not present

## 2017-08-28 DIAGNOSIS — Z0000 Encounter for general adult medical examination without abnormal findings: Secondary | ICD-10-CM | POA: Diagnosis not present

## 2017-08-28 DIAGNOSIS — M25562 Pain in left knee: Secondary | ICD-10-CM | POA: Diagnosis not present

## 2017-08-28 DIAGNOSIS — E7800 Pure hypercholesterolemia, unspecified: Secondary | ICD-10-CM | POA: Diagnosis not present

## 2017-08-28 DIAGNOSIS — D649 Anemia, unspecified: Secondary | ICD-10-CM | POA: Diagnosis not present

## 2017-09-20 DIAGNOSIS — H11153 Pinguecula, bilateral: Secondary | ICD-10-CM | POA: Diagnosis not present

## 2017-09-20 DIAGNOSIS — H5203 Hypermetropia, bilateral: Secondary | ICD-10-CM | POA: Diagnosis not present

## 2017-09-20 DIAGNOSIS — H3589 Other specified retinal disorders: Secondary | ICD-10-CM | POA: Diagnosis not present

## 2017-09-20 DIAGNOSIS — Z961 Presence of intraocular lens: Secondary | ICD-10-CM | POA: Diagnosis not present

## 2017-09-20 DIAGNOSIS — H0100A Unspecified blepharitis right eye, upper and lower eyelids: Secondary | ICD-10-CM | POA: Diagnosis not present

## 2017-09-20 DIAGNOSIS — H52223 Regular astigmatism, bilateral: Secondary | ICD-10-CM | POA: Diagnosis not present

## 2017-09-20 DIAGNOSIS — H40003 Preglaucoma, unspecified, bilateral: Secondary | ICD-10-CM | POA: Diagnosis not present

## 2017-09-20 DIAGNOSIS — H0100B Unspecified blepharitis left eye, upper and lower eyelids: Secondary | ICD-10-CM | POA: Diagnosis not present

## 2017-09-20 DIAGNOSIS — H04123 Dry eye syndrome of bilateral lacrimal glands: Secondary | ICD-10-CM | POA: Diagnosis not present

## 2017-09-20 DIAGNOSIS — Z9849 Cataract extraction status, unspecified eye: Secondary | ICD-10-CM | POA: Diagnosis not present

## 2017-09-20 DIAGNOSIS — H18413 Arcus senilis, bilateral: Secondary | ICD-10-CM | POA: Diagnosis not present

## 2017-09-20 DIAGNOSIS — H40013 Open angle with borderline findings, low risk, bilateral: Secondary | ICD-10-CM | POA: Diagnosis not present

## 2017-10-02 ENCOUNTER — Other Ambulatory Visit: Payer: Self-pay | Admitting: Dermatology

## 2017-10-02 DIAGNOSIS — L409 Psoriasis, unspecified: Secondary | ICD-10-CM | POA: Diagnosis not present

## 2017-10-02 DIAGNOSIS — L821 Other seborrheic keratosis: Secondary | ICD-10-CM | POA: Diagnosis not present

## 2017-10-02 DIAGNOSIS — D485 Neoplasm of uncertain behavior of skin: Secondary | ICD-10-CM | POA: Diagnosis not present

## 2017-10-02 DIAGNOSIS — D229 Melanocytic nevi, unspecified: Secondary | ICD-10-CM | POA: Diagnosis not present

## 2017-10-02 DIAGNOSIS — L570 Actinic keratosis: Secondary | ICD-10-CM | POA: Diagnosis not present

## 2017-10-03 ENCOUNTER — Encounter: Payer: Self-pay | Admitting: Internal Medicine

## 2017-12-17 DIAGNOSIS — M1712 Unilateral primary osteoarthritis, left knee: Secondary | ICD-10-CM | POA: Diagnosis not present

## 2017-12-23 ENCOUNTER — Ambulatory Visit (HOSPITAL_COMMUNITY)
Admission: RE | Admit: 2017-12-23 | Discharge: 2017-12-23 | Disposition: A | Discharge status: Home / Self Care | Payer: PPO | Source: Ambulatory Visit | Attending: Urology | Admitting: Urology

## 2017-12-23 ENCOUNTER — Other Ambulatory Visit: Payer: Self-pay | Admitting: Urology

## 2017-12-23 DIAGNOSIS — C670 Malignant neoplasm of trigone of bladder: Secondary | ICD-10-CM | POA: Insufficient documentation

## 2017-12-23 DIAGNOSIS — R0989 Other specified symptoms and signs involving the circulatory and respiratory systems: Secondary | ICD-10-CM | POA: Diagnosis not present

## 2017-12-26 DIAGNOSIS — C23 Malignant neoplasm of gallbladder: Secondary | ICD-10-CM | POA: Diagnosis not present

## 2017-12-26 DIAGNOSIS — C670 Malignant neoplasm of trigone of bladder: Secondary | ICD-10-CM | POA: Diagnosis not present

## 2017-12-30 DIAGNOSIS — C670 Malignant neoplasm of trigone of bladder: Secondary | ICD-10-CM | POA: Diagnosis not present

## 2017-12-31 DIAGNOSIS — G4700 Insomnia, unspecified: Secondary | ICD-10-CM | POA: Diagnosis not present

## 2017-12-31 DIAGNOSIS — Z23 Encounter for immunization: Secondary | ICD-10-CM | POA: Diagnosis not present

## 2017-12-31 DIAGNOSIS — Z01818 Encounter for other preprocedural examination: Secondary | ICD-10-CM | POA: Diagnosis not present

## 2017-12-31 DIAGNOSIS — I10 Essential (primary) hypertension: Secondary | ICD-10-CM | POA: Diagnosis not present

## 2017-12-31 DIAGNOSIS — M539 Dorsopathy, unspecified: Secondary | ICD-10-CM | POA: Diagnosis not present

## 2018-01-03 ENCOUNTER — Other Ambulatory Visit: Payer: Self-pay | Admitting: Orthopedic Surgery

## 2018-01-06 NOTE — Progress Notes (Signed)
CXR 12-23-17 IN EPIC

## 2018-01-06 NOTE — Patient Instructions (Addendum)
Jeremiah Wright  01/06/2018   Your procedure is scheduled on: 01-13-18  Report to [facility] Main  Entrance              Report to admitting at 1230 PM    Call this number if you have problems the morning of surgery [phone]    Remember: Do not eat food :After [place] 0900 AM THEN NOTHING BY MOUTH    CLEAR LIQUID DIET   Foods Allowed                                                                     Foods Excluded  Coffee and tea, regular and decaf                             liquids that you cannot  Plain Jell-O in any flavor                                             see through such as: Fruit ices (not with fruit pulp)                                     milk, soups, orange juice  Iced Popsicles                                    All solid food Carbonated beverages, regular and diet                                    Cranberry, grape and apple juices Sports drinks like Gatorade Lightly seasoned clear broth or consume(fat free) Sugar, honey syrup     . BRUSH YOUR TEETH MORNING OF SURGERY AND RINSE YOUR MOUTH OUT, NO CHEWING GUM CANDY OR MINTS.     Take these medicines the morning of surgery with A SIP OF WATER: CARVEDILOL                                You may not have any metal on your body including hair pins and              piercings  Do not wear jewelry, , lotions, powders or perfumes, deodorant                        Men may shave face and neck.   Do not bring valuables to the hospital. [place].  Contacts, dentures or bridgework may not be worn into surgery.  Leave suitcase in the car. After surgery it may  be brought to your room.               Please read over the following fact sheets you were given: _____________________________________________________________________           [facility] - Preparing for Surgery Before surgery, you can  play an important role.  Because skin is not sterile, your skin needs to be as free of germs as possible.  You can reduce the number of germs on your skin by washing with CHG (chlorahexidine gluconate) soap before surgery.  CHG is an antiseptic cleaner which kills germs and bonds with the skin to continue killing germs even after washing. Please DO NOT use if you have an allergy to CHG or antibacterial soaps.  If your skin becomes reddened/irritated stop using the CHG and inform your nurse when you arrive at Short Stay. Do not shave (including legs and underarms) for at least 48 hours prior to the first CHG shower.  You may shave your face/neck. Please follow these instructions carefully:  1.  Shower with CHG Soap the night before surgery and the  morning of Surgery.  2.  If you choose to wash your hair, wash your hair first as usual with your  normal  shampoo.  3.  After you shampoo, rinse your hair and body thoroughly to remove the  shampoo.                           4.  Use CHG as you would any other liquid soap.  You can apply chg directly  to the skin and wash                       Gently with a scrungie or clean washcloth.  5.  Apply the CHG Soap to your body ONLY FROM THE NECK DOWN.   Do not use on face/ open                           Wound or open sores. Avoid contact with eyes, ears mouth and genitals (private parts).                       Wash face,  Genitals (private parts) with your normal soap.             6.  Wash thoroughly, paying special attention to the area where your surgery  will be performed.  7.  Thoroughly rinse your body with warm water from the neck down.  8.  DO NOT shower/wash with your normal soap after using and rinsing off  the CHG Soap.                9.  Pat yourself dry with a clean towel.            10.  Wear clean pajamas.            11.  Place clean sheets on your bed the night of your first shower and do not  sleep with pets. Day of Surgery : Do not apply any  lotions/deodorants the morning of surgery.  Please wear clean clothes to the hospital/surgery center.  FAILURE TO FOLLOW THESE INSTRUCTIONS MAY RESULT IN THE CANCELLATION OF YOUR SURGERY PATIENT SIGNATURE_________________________________  NURSE SIGNATURE__________________________________  ________________________________________________________________________  WHAT IS A BLOOD TRANSFUSION? Blood Transfusion Information  A transfusion is the replacement of blood  or some of its parts. Blood is made up of multiple cells which provide different functions.  Red blood cells carry oxygen and are used for blood loss replacement.  White blood cells fight against infection.  Platelets control bleeding.  Plasma helps clot blood.  Other blood products are available for specialized needs, such as hemophilia or other clotting disorders. BEFORE THE TRANSFUSION  Who gives blood for transfusions?   Healthy volunteers who are fully evaluated to make sure their blood is safe. This is blood bank blood. Transfusion therapy is the safest it has ever been in the practice of medicine. Before blood is taken from a donor, a complete history is taken to make sure that person has no history of diseases nor engages in risky social behavior (examples are intravenous drug use or sexual activity with multiple partners). The donor's travel history is screened to minimize risk of transmitting infections, such as malaria. The donated blood is tested for signs of infectious diseases, such as HIV and hepatitis. The blood is then tested to be sure it is compatible with you in order to minimize the chance of a transfusion reaction. If you or a relative donates blood, this is often done in anticipation of surgery and is not appropriate for emergency situations. It takes many days to process the donated blood. RISKS AND COMPLICATIONS Although transfusion therapy is very safe and saves many lives, the main dangers of transfusion  include:   Getting an infectious disease.  Developing a transfusion reaction. This is an allergic reaction to something in the blood you were given. Every precaution is taken to prevent this. The decision to have a blood transfusion has been considered carefully by your caregiver before blood is given. Blood is not given unless the benefits outweigh the risks. AFTER THE TRANSFUSION  Right after receiving a blood transfusion, you will usually feel much better and more energetic. This is especially true if your red blood cells have gotten low (anemic). The transfusion raises the level of the red blood cells which carry oxygen, and this usually causes an energy increase.  The nurse administering the transfusion will monitor you carefully for complications. HOME CARE INSTRUCTIONS  No special instructions are needed after a transfusion. You may find your energy is better. Speak with your caregiver about any limitations on activity for underlying diseases you may have. SEEK MEDICAL CARE IF:   Your condition is not improving after your transfusion.  You develop redness or irritation at the intravenous (IV) site. SEEK IMMEDIATE MEDICAL CARE IF:  Any of the following symptoms occur over the next 12 hours:  Shaking chills.  You have a temperature by mouth above 102 F (38.9 C), not controlled by medicine.  Chest, back, or muscle pain.  People around you feel you are not acting correctly or are confused.  Shortness of breath or difficulty breathing.  Dizziness and fainting.  You get a rash or develop hives.  You have a decrease in urine output.  Your urine turns a dark color or changes to pink, red, or brown. Any of the following symptoms occur over the next 10 days:  You have a temperature by mouth above 102 F (38.9 C), not controlled by medicine.  Shortness of breath.  Weakness after normal activity.  The white part of the eye turns yellow (jaundice).  You have a decrease in  the amount of urine or are urinating less often.  Your urine turns a dark color or changes to pink, red, or  brown. Document Released: 01/27/2000 Document Revised: 04/23/2011 Document Reviewed: 09/15/2007 ExitCare Patient Information 2014 [place].  _______________________________________________________________________  Incentive Spirometer  An incentive spirometer is a tool that can help keep your lungs clear and active. This tool measures how well you are filling your lungs with each breath. Taking long deep breaths may help reverse or decrease the chance of developing breathing (pulmonary) problems (especially infection) following:  A long period of time when you are unable to move or be active. BEFORE THE PROCEDURE   If the spirometer includes an indicator to show your best effort, your nurse or respiratory therapist will set it to a desired goal.  If possible, sit up straight or lean slightly forward. Try not to slouch.  Hold the incentive spirometer in an upright position. INSTRUCTIONS FOR USE  1. Sit on the edge of your bed if possible, or sit up as far as you can in bed or on a chair. 2. Hold the incentive spirometer in an upright position. 3. Breathe out normally. 4. Place the mouthpiece in your mouth and seal your lips tightly around it. 5. Breathe in slowly and as deeply as possible, raising the piston or the ball toward the top of the column. 6. Hold your breath for 3-5 seconds or for as long as possible. Allow the piston or ball to fall to the bottom of the column. 7. Remove the mouthpiece from your mouth and breathe out normally. 8. Rest for a few seconds and repeat Steps 1 through 7 at least 10 times every 1-2 hours when you are awake. Take your time and take a few normal breaths between deep breaths. 9. The spirometer may include an indicator to show your best effort. Use the indicator as a goal to work toward during each repetition. 10. After each set of 10 deep  breaths, practice coughing to be sure your lungs are clear. If you have an incision (the cut made at the time of surgery), support your incision when coughing by placing a pillow or rolled up towels firmly against it. Once you are able to get out of bed, walk around indoors and cough well. You may stop using the incentive spirometer when instructed by your caregiver.  RISKS AND COMPLICATIONS  Take your time so you do not get dizzy or light-headed.  If you are in pain, you may need to take or ask for pain medication before doing incentive spirometry. It is harder to take a deep breath if you are having pain. AFTER USE  Rest and breathe slowly and easily.  It can be helpful to keep track of a log of your progress. Your caregiver can provide you with a simple table to help with this. If you are using the spirometer at home, follow these instructions: [place] IF:   You are having difficultly using the spirometer.  You have trouble using the spirometer as often as instructed.  Your pain medication is not giving enough relief while using the spirometer.  You develop fever of 100.5 F (38.1 C) or higher. SEEK IMMEDIATE MEDICAL CARE IF:   You cough up bloody sputum that had not been present before.  You develop fever of 102 F (38.9 C) or greater.  You develop worsening pain at or near the incision site. MAKE SURE YOU:   Understand these instructions.  Will watch your condition.  Will get help right away if you are not doing well or get worse. Document Released: 06/11/2006 Document Revised: 04/23/2011 Document  Reviewed: 08/12/2006 ExitCare Patient Information 2014 [place], [place].   ________________________________________________________________________

## 2018-01-07 ENCOUNTER — Encounter (HOSPITAL_COMMUNITY)
Admission: RE | Admit: 2018-01-07 | Discharge: 2018-01-07 | Disposition: A | Discharge status: Home / Self Care | Payer: PPO | Source: Ambulatory Visit | Attending: Orthopedic Surgery | Admitting: Orthopedic Surgery

## 2018-01-07 ENCOUNTER — Other Ambulatory Visit: Payer: Self-pay

## 2018-01-07 ENCOUNTER — Encounter (HOSPITAL_COMMUNITY): Payer: Self-pay

## 2018-01-07 DIAGNOSIS — M1712 Unilateral primary osteoarthritis, left knee: Secondary | ICD-10-CM | POA: Diagnosis not present

## 2018-01-07 DIAGNOSIS — Z01818 Encounter for other preprocedural examination: Secondary | ICD-10-CM | POA: Insufficient documentation

## 2018-01-07 LAB — URINALYSIS, ROUTINE W REFLEX MICROSCOPIC
Bilirubin Urine: NEGATIVE
GLUCOSE, UA: NEGATIVE mg/dL
HGB URINE DIPSTICK: NEGATIVE
Ketones, ur: NEGATIVE mg/dL
NITRITE: NEGATIVE
Protein, ur: 100 mg/dL — AB
SPECIFIC GRAVITY, URINE: 1.012 (ref 1.005–1.030)
pH: 9 — ABNORMAL HIGH (ref 5.0–8.0)

## 2018-01-07 LAB — BASIC METABOLIC PANEL
Anion gap: 8 (ref 5–15)
BUN: 26 mg/dL — ABNORMAL HIGH (ref 8–23)
CHLORIDE: 108 mmol/L (ref 98–111)
CO2: 26 mmol/L (ref 22–32)
CREATININE: 1.49 mg/dL — AB (ref 0.61–1.24)
Calcium: 9.3 mg/dL (ref 8.9–10.3)
GFR calc non Af Amer: 45 mL/min — ABNORMAL LOW (ref >60)
GFR, EST AFRICAN AMERICAN: 52 mL/min — AB (ref >60)
Glucose, Bld: 109 mg/dL — ABNORMAL HIGH (ref 70–99)
POTASSIUM: 4.4 mmol/L (ref 3.5–5.1)
Sodium: 142 mmol/L (ref 135–145)

## 2018-01-07 LAB — CBC WITH DIFFERENTIAL/PLATELET
ABS IMMATURE GRANULOCYTES: 0.05 10*3/uL (ref 0.00–0.07)
BASOS ABS: 0.1 10*3/uL (ref 0.0–0.1)
Basophils Relative: 1 %
EOS PCT: 4 %
Eosinophils Absolute: 0.3 10*3/uL (ref 0.0–0.5)
HEMATOCRIT: 41.6 % (ref 39.0–52.0)
HEMOGLOBIN: 13.4 g/dL (ref 13.0–17.0)
IMMATURE GRANULOCYTES: 1 %
LYMPHS ABS: 1.7 10*3/uL (ref 0.7–4.0)
LYMPHS PCT: 18 %
MCH: 31.1 pg (ref 26.0–34.0)
MCHC: 32.2 g/dL (ref 30.0–36.0)
MCV: 96.5 fL (ref 80.0–100.0)
Monocytes Absolute: 0.7 10*3/uL (ref 0.1–1.0)
Monocytes Relative: 7 %
NEUTROS ABS: 6.3 10*3/uL (ref 1.7–7.7)
NRBC: 0 % (ref 0.0–0.2)
Neutrophils Relative %: 69 %
Platelets: 307 10*3/uL (ref 150–400)
RBC: 4.31 MIL/uL (ref 4.22–5.81)
RDW: 12.1 % (ref 11.5–15.5)
WBC: 9.1 10*3/uL (ref 4.0–10.5)

## 2018-01-07 LAB — SURGICAL PCR SCREEN
MRSA, PCR: NEGATIVE
Staphylococcus aureus: NEGATIVE

## 2018-01-07 LAB — APTT: aPTT: 33 seconds (ref 24–36)

## 2018-01-07 NOTE — Progress Notes (Signed)
Spoke with Linden Dolin CMA at [facility]. Reported pt. BP at preop appt. For surgery 01-13-18 225/124 left arm, 222/101 left arm 201/89 left arm and 180/ 89 right arm. Pt. Stated he had scar tissue in the right arm that's why we waited to check the right arm BP last . Pt. Stated he thought he had taken 2 tramadol this am instead of his carvedilol because the pills looked alike. Debbie to report this to MD for follow up.pt. Stated he was going home to check his medication and take his carvedilol.

## 2018-01-08 DIAGNOSIS — M1712 Unilateral primary osteoarthritis, left knee: Secondary | ICD-10-CM | POA: Diagnosis present

## 2018-01-08 DIAGNOSIS — I10 Essential (primary) hypertension: Secondary | ICD-10-CM | POA: Diagnosis not present

## 2018-01-08 NOTE — H&P (Signed)
TOTAL KNEE ADMISSION H&P  Patient is being admitted for left total knee arthroplasty.  Subjective:  Chief Complaint:left knee pain.  HPI: Jeremiah Wright, 77 y.o. male, has a history of pain and functional disability in the left knee due to arthritis and has failed non-surgical conservative treatments for greater than 12 weeks to includeNSAID's and/or analgesics, corticosteriod injections, viscosupplementation injections, flexibility and strengthening excercises, use of assistive devices, weight reduction as appropriate and activity modification.  Onset of symptoms was gradual, starting several years ago with gradually worsening course since that time. The patient noted no past surgery on the left knee(s).  Patient currently rates pain in the left knee(s) at 10 out of 10 with activity. Patient has night pain, worsening of pain with activity and weight bearing, pain that interferes with activities of daily living, pain with passive range of motion and crepitus.  Patient has evidence of subchondral cysts, subchondral sclerosis, periarticular osteophytes and joint space narrowing by imaging studies.  There is no active infection.  Patient Active Problem List   Diagnosis Date Noted  . Bladder cancer ([place]) 09/14/2013  . Arthritis of shoulder region, left 11/30/2011   Past Medical History:  Diagnosis Date  . Arthritis    SHOULDERS AND KNEES - ARTHRITIS AND PAIN  . Bladder cancer ([place])    "[name]"   prostate  . GERD (gastroesophageal reflux disease)    under control  . Heart murmur    PT TOLD SLIGHT HEART MURMUR - DOESN'T CAUSE ANY PROBLEMS  . History of diverticulitis of colon   . Hypertension   . Insomnia     Past Surgical History:  Procedure Laterality Date  . CARDIOVASCULAR STRESS TEST  10-14-2008  DR BERRY   NORMAL PERFUSION STUDY/ NO ISCHEMIA/ EF 61%  . CYSTOSCOPY N/A 03/02/2013   Procedure: CYSTOSCOPY WITH INSTILLATION OF MITOMYCIN C;  Surgeon: Ailene Rud, MD;   Location: [facility];  Service: Urology;  Laterality: N/A;  . CYSTOSCOPY N/A 12/30/2013   Procedure: CYSTOSCOPY WITH INDOCYANINE GREEN DYE INJECTION;  Surgeon: Alexis Frock, MD;  Location: [facility];  Service: Urology;  Laterality: N/A;  . CYSTOSCOPY W/ RETROGRADES Right 04/17/2012   Procedure: CYSTOSCOPY WITH RETROGRADE PYELOGRAM;  Surgeon: Ailene Rud, MD;  Location: [facility];  Service: Urology;  Laterality: Right;  . CYSTOSCOPY WITH BIOPSY Right 04/17/2012   Procedure: CYSTOSCOPY WITH  BIOPSY OF RIGHT BLADDER DIVERTICULUM TUMOR BUTTON VAPORIZATION OF TUMOR INSIDE DIVERTICULUM;  Surgeon: Ailene Rud, MD;  Location[facility];  Service: Urology;  Laterality: Right;  CYSTOSCOPY WITH COLD CUP BIOPSY OF RIGHT BLADDER DIVERTICULUM TUMOR/HOLMIUM LASER OF BLADDER TUMOR/POSSIBLE BUTTON VAPORIZATION OF TUMOR INSIDE DIVERTICULUM  . CYSTOSCOPY WITH BIOPSY N/A 11/07/2012   Procedure: CYSTOSCOPY WITH COLD CUP BIOPSY AND FULGERATION;  Surgeon: Ailene Rud, MD;  Location: [facility];  Service: Urology;  Laterality: N/A;  . CYSTOSCOPY WITH RETROGRADE PYELOGRAM, URETEROSCOPY AND STENT PLACEMENT Bilateral 09/14/2013   Procedure: CYSTO BILATERAL RETROGRADE PYELOGRAM;  Surgeon: Ailene Rud, MD;  Location: [facility];  Service: Urology;  Laterality: Bilateral;  . FATTY TUMOR     REMOVED FROM BACK  . JOINT REPLACEMENT     [name]  01-13-18  . KNEE ARTHROSCOPY Bilateral >10 YRS AGO  . OPEN ACROMIONECTOMY/ RESECTION DISTAL CLAVICLE/ REPAIR ROTATOR CUFF  03-15-2009   LEFT SHOULDER  . REVERSE SHOULDER ARTHROPLASTY  11/29/2011   Procedure: REVERSE SHOULDER ARTHROPLASTY;  Surgeon: [name]  [name], MD;  Location: [facility];  Service: Orthopedics;  Laterality: Left;  Left Reverse Total Shoulder Arthroplasty with left supra clavicular block  . ROBOT ASSISTED LAPAROSCOPIC COMPLETE CYSTECT ILEAL  CONDUIT N/A 12/30/2013   Procedure: XI ROBOTIC ASSISTED LAPAROSCOPIC COMPLETE CYSTECTOMY, ILEAL CONDUIT;  Surgeon: Alexis Frock, MD;  Location: [facility];  Service: Urology;  Laterality: N/A;  . SECONDARY CLOSURE ARM     RT ARM INJURY  . SHOULDER OPEN ROTATOR CUFF REPAIR  07-18-2001   AND PARTIAL ACROMIONECTOMY/ ACROMIOPLASTY ,  RIGHT SHOULDER  . TONSILLECTOMY  as child  . TRANSTHORACIC ECHOCARDIOGRAM  09-07-2008   DR BERRY   LVEF 55-65%/ BORDERLINE LV HYPERTROPHY  . TRANSURETHRAL RESECTION OF BLADDER TUMOR N/A 03/02/2013   Procedure: TRANSURETHRAL RESECTION OF BLADDER TUMOR (TURBT);  Surgeon: Ailene Rud, MD;  Location: [facility];  Service: Urology;  Laterality: N/A;  . TRANSURETHRAL RESECTION OF BLADDER TUMOR N/A 09/14/2013   Procedure: TRANSURETHRAL RESECTION OF BLADDER TUMOR (TURBT)/TURP WITH GYRUS;  Surgeon: Ailene Rud, MD;  Location: [facility];  Service: Urology;  Laterality: N/A;  . TRANSURETHRAL RESECTION OF BLADDER TUMOR WITH MITOMYCIN-C N/A 11/07/2012   Procedure: TRANSURETHRAL RESECTION OF BLADDER TUMOR WITH MITOMYCIN-C;  Surgeon: Ailene Rud, MD;  Location: [facility];  Service: Urology;  Laterality: N/A;    No current facility-administered medications for this encounter.    Current Outpatient Medications  Medication Sig Dispense Refill Last Dose  . carvedilol (COREG) 12.5 MG tablet Take 12.5 mg by mouth 2 (two) times daily with a meal.  11   . hydrocortisone cream 1 % Apply 1 application topically 2 (two) times daily as needed for itching.   09/13/2013 at 1930  . LORazepam (ATIVAN) 0.5 MG tablet Take 0.5 mg by mouth at bedtime as needed for anxiety.   2   . traMADol (ULTRAM) 50 MG tablet Take 50 mg by mouth every 6 (six) hours as needed for moderate pain.    09/14/2013 at 0530  . zolpidem (AMBIEN) 10 MG tablet Take 10 mg by mouth at bedtime as needed for sleep.    09/13/2013 at 2359   No Known Allergies  Social History   Tobacco  Use  . Smoking status: Former Smoker    Types: Cigars    Last attempt to quit: 11/20/2003    Years since quitting: 14.1  . Smokeless tobacco: Never Used  Substance Use Topics  . Alcohol use: No    No family history on file.   Review of Systems  Constitutional: Negative.   HENT: Negative.   Eyes: Negative.   Respiratory: Negative.   Cardiovascular: Negative.   Gastrointestinal: Negative.   Genitourinary: Negative.   Musculoskeletal: Positive for joint pain and myalgias.  Skin: Negative.   Neurological: Negative.   Endo/Heme/Allergies: Negative.   Psychiatric/Behavioral: Negative.     Objective:  Physical Exam  Constitutional: He is oriented to person, place, and time. He appears well-developed and well-nourished.  HENT:  Head: Normocephalic and atraumatic.  Eyes: Pupils are equal, round, and reactive to light.  Neck: Normal range of motion. Neck supple.  Cardiovascular: Intact distal pulses.  Respiratory: Effort normal.  Musculoskeletal: He exhibits tenderness and deformity.  Examination of the left knee reveals he has varum alignment.  Some mild limitation with extension.  Neurovascularly intact otherwise.  No signs of infection.  Neurological: He is alert and oriented to person, place, and time.  Skin: Skin is warm and dry.  Psychiatric: He has a normal  mood and affect. His behavior is normal. Judgment and thought content normal.    Vital signs in last 24 hours: Temp:  [99.3 F (37.4 C)] 99.3 F (37.4 C) (11/26 1546) Pulse Rate:  [65] 65 (11/26 1546) Resp:  [16] 16 (11/26 1546) BP: (210)/(89) 210/89 (11/26 1649) SpO2:  [97 %] 97 % (11/26 1546) Weight:  [94.8 kg] 94.8 kg (11/26 1546)  Labs:   Estimated body mass index is 33.73 kg/m as calculated from the following:   Height as of 01/07/18: 5\' 6"  (1.676 m).   Weight as of 01/07/18: 94.8 kg.   Imaging Review Plain radiographs demonstrate  4 view x-ray of the left knee reveals he has multicompartment  degenerative joint disease with end stage osteoarthritis of the medial compartment with bony sclerosing, complete loss of joint spacing and subchondral cystic changes of the medial tibial plateau.   Preoperative templating of the joint replacement has been completed, documented, and submitted to the Operating Room personnel in order to optimize intra-operative equipment management.   Anticipated LOS equal to or greater than 2 midnights due to - Age 46 and older with one or more of the following:  - Obesity  - Expected need for hospital services (PT, OT, Nursing) required for safe  discharge  - Anticipated need for postoperative skilled nursing care or inpatient rehab    Assessment/Plan:  End stage arthritis, left knee   The patient history, physical examination, clinical judgment of the provider and imaging studies are consistent with end stage degenerative joint disease of the left knee(s) and total knee arthroplasty is deemed medically necessary. The treatment options including medical management, injection therapy arthroscopy and arthroplasty were discussed at length. The risks and benefits of total knee arthroplasty were presented and reviewed. The risks due to aseptic loosening, infection, stiffness, patella tracking problems, thromboembolic complications and other imponderables were discussed. The patient acknowledged the explanation, agreed to proceed with the plan and consent was signed. Patient is being admitted for inpatient treatment for surgery, pain control, PT, OT, prophylactic antibiotics, VTE prophylaxis, progressive ambulation and ADL's and discharge planning. The patient is planning to be discharged home with home health services.

## 2018-01-12 MED ORDER — TRANEXAMIC ACID 1000 MG/10ML IV SOLN
2000.0000 mg | INTRAVENOUS | Status: DC
  Filled 2018-01-12: qty 20

## 2018-01-12 MED ORDER — BUPIVACAINE LIPOSOME 1.3 % IJ SUSP
20.0000 mL | INTRAMUSCULAR | Status: DC
  Filled 2018-01-12: qty 20

## 2018-01-13 ENCOUNTER — Encounter (HOSPITAL_COMMUNITY)
Admission: RE | Disposition: A | Discharge status: Home / Self Care | Payer: Self-pay | Source: Ambulatory Visit | Attending: Orthopedic Surgery

## 2018-01-13 ENCOUNTER — Other Ambulatory Visit: Payer: Self-pay

## 2018-01-13 ENCOUNTER — Ambulatory Visit (HOSPITAL_COMMUNITY): Payer: PPO | Admitting: Anesthesiology

## 2018-01-13 ENCOUNTER — Ambulatory Visit (HOSPITAL_COMMUNITY)
Admission: RE | Admit: 2018-01-13 | Discharge: 2018-01-14 | Disposition: A | Discharge status: Home / Self Care | Payer: PPO | Source: Ambulatory Visit | Attending: Orthopedic Surgery | Admitting: Orthopedic Surgery

## 2018-01-13 ENCOUNTER — Encounter (HOSPITAL_COMMUNITY): Payer: Self-pay | Admitting: Anesthesiology

## 2018-01-13 DIAGNOSIS — I10 Essential (primary) hypertension: Secondary | ICD-10-CM | POA: Insufficient documentation

## 2018-01-13 DIAGNOSIS — G8918 Other acute postprocedural pain: Secondary | ICD-10-CM | POA: Diagnosis not present

## 2018-01-13 DIAGNOSIS — F17290 Nicotine dependence, other tobacco product, uncomplicated: Secondary | ICD-10-CM | POA: Insufficient documentation

## 2018-01-13 DIAGNOSIS — Z96652 Presence of left artificial knee joint: Secondary | ICD-10-CM

## 2018-01-13 DIAGNOSIS — D62 Acute posthemorrhagic anemia: Secondary | ICD-10-CM | POA: Insufficient documentation

## 2018-01-13 DIAGNOSIS — M1712 Unilateral primary osteoarthritis, left knee: Secondary | ICD-10-CM | POA: Diagnosis present

## 2018-01-13 DIAGNOSIS — Z79899 Other long term (current) drug therapy: Secondary | ICD-10-CM | POA: Diagnosis not present

## 2018-01-13 HISTORY — PX: TOTAL KNEE ARTHROPLASTY: SHX125

## 2018-01-13 LAB — TYPE AND SCREEN
ABO/RH(D): A NEG
ANTIBODY SCREEN: NEGATIVE

## 2018-01-13 SURGERY — ARTHROPLASTY, KNEE, TOTAL
Anesthesia: Spinal | Laterality: Left

## 2018-01-13 MED ORDER — CHLORHEXIDINE GLUCONATE 4 % EX LIQD
60.0000 mL | Freq: Once | CUTANEOUS | Status: AC
  Administered 2018-01-13: 4 via TOPICAL

## 2018-01-13 MED ORDER — FENTANYL CITRATE (PF) 100 MCG/2ML IJ SOLN: 25.0000 ug | INTRAMUSCULAR | Status: DC | PRN

## 2018-01-13 MED ORDER — ONDANSETRON HCL 4 MG/2ML IJ SOLN: 4.0000 mg | Freq: Four times a day (QID) | INTRAMUSCULAR | Status: DC | PRN

## 2018-01-13 MED ORDER — ASPIRIN 81 MG PO CHEW
81.0000 mg | CHEWABLE_TABLET | Freq: Two times a day (BID) | ORAL | Status: DC
  Administered 2018-01-13 – 2018-01-14 (×2): 81 mg via ORAL
  Filled 2018-01-13 (×2): qty 1

## 2018-01-13 MED ORDER — TRANEXAMIC ACID 1000 MG/10ML IV SOLN
INTRAVENOUS | Status: DC | PRN
  Administered 2018-01-13: 2000 mg via TOPICAL

## 2018-01-13 MED ORDER — PROPOFOL 10 MG/ML IV BOLUS
INTRAVENOUS | Status: AC
  Filled 2018-01-13: qty 40

## 2018-01-13 MED ORDER — PROPOFOL 10 MG/ML IV BOLUS
INTRAVENOUS | Status: AC
  Filled 2018-01-13: qty 20

## 2018-01-13 MED ORDER — MIDAZOLAM HCL 2 MG/2ML IJ SOLN
1.0000 mg | INTRAMUSCULAR | Status: DC
  Filled 2018-01-13: qty 2

## 2018-01-13 MED ORDER — PHENYLEPHRINE 40 MCG/ML (10ML) SYRINGE FOR IV PUSH (FOR BLOOD PRESSURE SUPPORT)
PREFILLED_SYRINGE | INTRAVENOUS | Status: DC | PRN
  Administered 2018-01-13 (×3): 80 ug via INTRAVENOUS

## 2018-01-13 MED ORDER — FLEET ENEMA 7-19 GM/118ML RE ENEM: 1.0000 | ENEMA | Freq: Once | RECTAL | Status: DC | PRN

## 2018-01-13 MED ORDER — BUPIVACAINE LIPOSOME 1.3 % IJ SUSP
INTRAMUSCULAR | Status: DC | PRN
  Administered 2018-01-13: 20 mL

## 2018-01-13 MED ORDER — HYDROMORPHONE HCL 1 MG/ML IJ SOLN
0.5000 mg | INTRAMUSCULAR | Status: DC | PRN
  Administered 2018-01-14: 0.5 mg via INTRAVENOUS
  Filled 2018-01-13: qty 1

## 2018-01-13 MED ORDER — KCL IN DEXTROSE-NACL 20-5-0.45 MEQ/L-%-% IV SOLN
INTRAVENOUS | Status: DC
  Administered 2018-01-13 – 2018-01-14 (×2): via INTRAVENOUS
  Filled 2018-01-13 (×3): qty 1000

## 2018-01-13 MED ORDER — FENTANYL CITRATE (PF) 100 MCG/2ML IJ SOLN
50.0000 ug | INTRAMUSCULAR | Status: DC
  Administered 2018-01-13: 25 ug via INTRAVENOUS
  Filled 2018-01-13: qty 2

## 2018-01-13 MED ORDER — METHOCARBAMOL 500 MG IVPB - SIMPLE MED
500.0000 mg | Freq: Four times a day (QID) | INTRAVENOUS | Status: DC | PRN
  Filled 2018-01-13: qty 50

## 2018-01-13 MED ORDER — SODIUM CHLORIDE 0.9 % IV SOLN
INTRAVENOUS | Status: DC | PRN
  Administered 2018-01-13: 50 ug/min via INTRAVENOUS

## 2018-01-13 MED ORDER — CARVEDILOL 12.5 MG PO TABS
12.5000 mg | ORAL_TABLET | Freq: Two times a day (BID) | ORAL | Status: DC
  Administered 2018-01-14: 12.5 mg via ORAL
  Filled 2018-01-13: qty 1

## 2018-01-13 MED ORDER — DEXAMETHASONE SODIUM PHOSPHATE 10 MG/ML IJ SOLN
10.0000 mg | Freq: Once | INTRAMUSCULAR | Status: AC
  Administered 2018-01-14: 10 mg via INTRAVENOUS
  Filled 2018-01-13: qty 1

## 2018-01-13 MED ORDER — LORAZEPAM 0.5 MG PO TABS: 0.5000 mg | ORAL_TABLET | Freq: Every evening | ORAL | Status: DC | PRN

## 2018-01-13 MED ORDER — HYDROCORTISONE 1 % EX CREA
1.0000 "application " | TOPICAL_CREAM | Freq: Two times a day (BID) | CUTANEOUS | Status: DC | PRN
  Filled 2018-01-13: qty 28

## 2018-01-13 MED ORDER — MEPERIDINE HCL 50 MG/ML IJ SOLN: 6.2500 mg | INTRAMUSCULAR | Status: DC | PRN

## 2018-01-13 MED ORDER — MENTHOL 3 MG MT LOZG: 1.0000 | LOZENGE | OROMUCOSAL | Status: DC | PRN

## 2018-01-13 MED ORDER — GABAPENTIN 300 MG PO CAPS
300.0000 mg | ORAL_CAPSULE | Freq: Three times a day (TID) | ORAL | Status: DC
  Administered 2018-01-13 – 2018-01-14 (×2): 300 mg via ORAL
  Filled 2018-01-13 (×2): qty 1

## 2018-01-13 MED ORDER — WATER FOR IRRIGATION, STERILE IR SOLN
Status: DC | PRN
  Administered 2018-01-13: 2000 mL

## 2018-01-13 MED ORDER — METOCLOPRAMIDE HCL 5 MG PO TABS: 5.0000 mg | ORAL_TABLET | Freq: Three times a day (TID) | ORAL | Status: DC | PRN

## 2018-01-13 MED ORDER — LACTATED RINGERS IV SOLN
INTRAVENOUS | Status: DC
  Administered 2018-01-13 (×2): via INTRAVENOUS

## 2018-01-13 MED ORDER — DEXAMETHASONE SODIUM PHOSPHATE 10 MG/ML IJ SOLN
INTRAMUSCULAR | Status: DC | PRN
  Administered 2018-01-13: 6 mg via INTRAVENOUS

## 2018-01-13 MED ORDER — OXYCODONE HCL 5 MG PO TABS
5.0000 mg | ORAL_TABLET | ORAL | Status: DC | PRN
  Administered 2018-01-14: 5 mg via ORAL
  Administered 2018-01-14: 10 mg via ORAL
  Administered 2018-01-14: 5 mg via ORAL
  Administered 2018-01-14: 10 mg via ORAL
  Filled 2018-01-13: qty 2
  Filled 2018-01-13: qty 1
  Filled 2018-01-13 (×2): qty 2

## 2018-01-13 MED ORDER — PROPOFOL 500 MG/50ML IV EMUL
INTRAVENOUS | Status: DC | PRN
  Administered 2018-01-13: 50 ug/kg/min via INTRAVENOUS

## 2018-01-13 MED ORDER — ZOLPIDEM TARTRATE 5 MG PO TABS
5.0000 mg | ORAL_TABLET | Freq: Every evening | ORAL | Status: DC | PRN
  Administered 2018-01-13: 5 mg via ORAL
  Filled 2018-01-13: qty 1

## 2018-01-13 MED ORDER — PHENYLEPHRINE HCL 10 MG/ML IJ SOLN
INTRAMUSCULAR | Status: AC
  Filled 2018-01-13: qty 2

## 2018-01-13 MED ORDER — ACETAMINOPHEN 325 MG PO TABS: 325.0000 mg | ORAL_TABLET | Freq: Four times a day (QID) | ORAL | Status: DC | PRN

## 2018-01-13 MED ORDER — PROMETHAZINE HCL 25 MG/ML IJ SOLN: 6.2500 mg | INTRAMUSCULAR | Status: DC | PRN

## 2018-01-13 MED ORDER — OXYCODONE-ACETAMINOPHEN 5-325 MG PO TABS: 1.0000 | ORAL_TABLET | ORAL | 0 refills | Status: DC | PRN

## 2018-01-13 MED ORDER — ONDANSETRON HCL 4 MG PO TABS: 4.0000 mg | ORAL_TABLET | Freq: Four times a day (QID) | ORAL | Status: DC | PRN

## 2018-01-13 MED ORDER — BUPIVACAINE IN DEXTROSE 0.75-8.25 % IT SOLN
INTRATHECAL | Status: DC | PRN
  Administered 2018-01-13: 1.6 mL via INTRATHECAL

## 2018-01-13 MED ORDER — IRBESARTAN 75 MG PO TABS
37.5000 mg | ORAL_TABLET | Freq: Every day | ORAL | Status: DC
  Administered 2018-01-14: 37.5 mg via ORAL
  Filled 2018-01-13: qty 1

## 2018-01-13 MED ORDER — DIPHENHYDRAMINE HCL 12.5 MG/5ML PO ELIX: 12.5000 mg | ORAL_SOLUTION | ORAL | Status: DC | PRN

## 2018-01-13 MED ORDER — SODIUM CHLORIDE (PF) 0.9 % IJ SOLN
INTRAMUSCULAR | Status: AC
  Filled 2018-01-13: qty 50

## 2018-01-13 MED ORDER — PANTOPRAZOLE SODIUM 40 MG PO TBEC
40.0000 mg | DELAYED_RELEASE_TABLET | Freq: Every day | ORAL | Status: DC
  Administered 2018-01-13 – 2018-01-14 (×2): 40 mg via ORAL
  Filled 2018-01-13 (×2): qty 1

## 2018-01-13 MED ORDER — TIZANIDINE HCL 2 MG PO TABS: 2.0000 mg | ORAL_TABLET | Freq: Four times a day (QID) | ORAL | 0 refills | Status: DC | PRN

## 2018-01-13 MED ORDER — ALUM & MAG HYDROXIDE-SIMETH 200-200-20 MG/5ML PO SUSP: 30.0000 mL | ORAL | Status: DC | PRN

## 2018-01-13 MED ORDER — METHOCARBAMOL 500 MG PO TABS
500.0000 mg | ORAL_TABLET | Freq: Four times a day (QID) | ORAL | Status: DC | PRN
  Administered 2018-01-14 (×2): 500 mg via ORAL
  Filled 2018-01-13 (×2): qty 1

## 2018-01-13 MED ORDER — BUPIVACAINE-EPINEPHRINE (PF) 0.25% -1:200000 IJ SOLN
INTRAMUSCULAR | Status: DC | PRN
  Administered 2018-01-13: 30 mL

## 2018-01-13 MED ORDER — BUPIVACAINE-EPINEPHRINE (PF) 0.25% -1:200000 IJ SOLN
INTRAMUSCULAR | Status: AC
  Filled 2018-01-13: qty 30

## 2018-01-13 MED ORDER — ONDANSETRON HCL 4 MG/2ML IJ SOLN
INTRAMUSCULAR | Status: DC | PRN
  Administered 2018-01-13: 4 mg via INTRAVENOUS

## 2018-01-13 MED ORDER — SODIUM CHLORIDE (PF) 0.9 % IJ SOLN
INTRAMUSCULAR | Status: DC | PRN
  Administered 2018-01-13: 50 mL

## 2018-01-13 MED ORDER — ASPIRIN EC 81 MG PO TBEC: 81.0000 mg | DELAYED_RELEASE_TABLET | Freq: Two times a day (BID) | ORAL | 0 refills | Status: DC

## 2018-01-13 MED ORDER — METOCLOPRAMIDE HCL 5 MG/ML IJ SOLN: 5.0000 mg | Freq: Three times a day (TID) | INTRAMUSCULAR | Status: DC | PRN

## 2018-01-13 MED ORDER — PHENOL 1.4 % MT LIQD
1.0000 | OROMUCOSAL | Status: DC | PRN
  Filled 2018-01-13: qty 177

## 2018-01-13 MED ORDER — SODIUM CHLORIDE 0.9 % IR SOLN
Status: DC | PRN
  Administered 2018-01-13: 1000 mL

## 2018-01-13 MED ORDER — BISACODYL 5 MG PO TBEC: 5.0000 mg | DELAYED_RELEASE_TABLET | Freq: Every day | ORAL | Status: DC | PRN

## 2018-01-13 MED ORDER — DOCUSATE SODIUM 100 MG PO CAPS
100.0000 mg | ORAL_CAPSULE | Freq: Two times a day (BID) | ORAL | Status: DC
  Administered 2018-01-13 – 2018-01-14 (×2): 100 mg via ORAL
  Filled 2018-01-13 (×2): qty 1

## 2018-01-13 MED ORDER — LACTATED RINGERS IV SOLN: INTRAVENOUS | Status: DC

## 2018-01-13 MED ORDER — PHENYLEPHRINE 40 MCG/ML (10ML) SYRINGE FOR IV PUSH (FOR BLOOD PRESSURE SUPPORT)
PREFILLED_SYRINGE | INTRAVENOUS | Status: AC
  Filled 2018-01-13: qty 10

## 2018-01-13 MED ORDER — EPHEDRINE SULFATE-NACL 50-0.9 MG/10ML-% IV SOSY
PREFILLED_SYRINGE | INTRAVENOUS | Status: DC | PRN
  Administered 2018-01-13: 5 mg via INTRAVENOUS

## 2018-01-13 MED ORDER — PROPOFOL 10 MG/ML IV BOLUS
INTRAVENOUS | Status: DC | PRN
  Administered 2018-01-13 (×2): 10 mg via INTRAVENOUS

## 2018-01-13 MED ORDER — POLYETHYLENE GLYCOL 3350 17 G PO PACK: 17.0000 g | PACK | Freq: Every day | ORAL | Status: DC | PRN

## 2018-01-13 MED ORDER — 0.9 % SODIUM CHLORIDE (POUR BTL) OPTIME
TOPICAL | Status: DC | PRN
  Administered 2018-01-13: 1000 mL

## 2018-01-13 MED ORDER — ROPIVACAINE HCL 7.5 MG/ML IJ SOLN
INTRAMUSCULAR | Status: DC | PRN
  Administered 2018-01-13: 20 mL via PERINEURAL

## 2018-01-13 MED ORDER — TRANEXAMIC ACID-NACL 1000-0.7 MG/100ML-% IV SOLN
1000.0000 mg | Freq: Once | INTRAVENOUS | Status: AC
  Administered 2018-01-13: 1000 mg via INTRAVENOUS
  Filled 2018-01-13: qty 100

## 2018-01-13 MED ORDER — TRANEXAMIC ACID-NACL 1000-0.7 MG/100ML-% IV SOLN
1000.0000 mg | INTRAVENOUS | Status: AC
  Administered 2018-01-13: 1000 mg via INTRAVENOUS
  Filled 2018-01-13: qty 100

## 2018-01-13 MED ORDER — CEFAZOLIN SODIUM-DEXTROSE 2-4 GM/100ML-% IV SOLN
2.0000 g | INTRAVENOUS | Status: AC
  Administered 2018-01-13: 2 g via INTRAVENOUS
  Filled 2018-01-13: qty 100

## 2018-01-13 SURGICAL SUPPLY — 53 items
ATTUNE MED DOME PAT 38 KNEE (Knees) ×2 IMPLANT
ATTUNE MED DOME PAT 38MM KNEE (Knees) ×1 IMPLANT
ATTUNE PS FEM LT SZ 6 CEM KNEE (Femur) ×3 IMPLANT
ATTUNE PSRP INSR SZ6 5 KNEE (Insert) ×2 IMPLANT
ATTUNE PSRP INSR SZ6 5MM KNEE (Insert) ×1 IMPLANT
BAG DECANTER FOR FLEXI CONT (MISCELLANEOUS) ×3 IMPLANT
BAG ZIPLOCK 12X15 (MISCELLANEOUS) ×3 IMPLANT
BANDAGE ELASTIC 6 VELCRO ST LF (GAUZE/BANDAGES/DRESSINGS) ×3 IMPLANT
BASE TIBIAL ROT PLAT SZ 7 KNEE (Knees) ×1 IMPLANT
BLADE SAG 18X100X1.27 (BLADE) ×3 IMPLANT
BLADE SAW SGTL 11.0X1.19X90.0M (BLADE) ×3 IMPLANT
BNDG ELASTIC 6X10 VLCR STRL LF (GAUZE/BANDAGES/DRESSINGS) ×3 IMPLANT
BOWL SMART MIX CTS (DISPOSABLE) ×3 IMPLANT
CEMENT HV SMART SET (Cement) ×6 IMPLANT
COVER SURGICAL LIGHT HANDLE (MISCELLANEOUS) ×3 IMPLANT
COVER WAND RF STERILE (DRAPES) IMPLANT
CUFF TOURN SGL QUICK 34 (TOURNIQUET CUFF) ×2
CUFF TRNQT CYL 34X4X40X1 (TOURNIQUET CUFF) ×1 IMPLANT
DECANTER SPIKE VIAL GLASS SM (MISCELLANEOUS) ×9 IMPLANT
DRAPE U-SHAPE 47X51 STRL (DRAPES) ×3 IMPLANT
DRSG AQUACEL AG ADV 3.5X10 (GAUZE/BANDAGES/DRESSINGS) ×3 IMPLANT
DURAPREP 26ML APPLICATOR (WOUND CARE) ×3 IMPLANT
ELECT REM PT RETURN 15FT ADLT (MISCELLANEOUS) ×3 IMPLANT
GLOVE BIO SURGEON STRL SZ7.5 (GLOVE) ×3 IMPLANT
GLOVE BIO SURGEON STRL SZ8.5 (GLOVE) ×3 IMPLANT
GLOVE BIOGEL PI IND STRL 8 (GLOVE) ×1 IMPLANT
GLOVE BIOGEL PI IND STRL 9 (GLOVE) ×1 IMPLANT
GLOVE BIOGEL PI INDICATOR 8 (GLOVE) ×2
GLOVE BIOGEL PI INDICATOR 9 (GLOVE) ×2
GOWN STRL REUS W/TWL XL LVL3 (GOWN DISPOSABLE) ×6 IMPLANT
HANDPIECE INTERPULSE COAX TIP (DISPOSABLE) ×2
HOOD PEEL AWAY FLYTE STAYCOOL (MISCELLANEOUS) ×9 IMPLANT
NEEDLE HYPO 21X1.5 SAFETY (NEEDLE) ×6 IMPLANT
NS IRRIG 1000ML POUR BTL (IV SOLUTION) ×3 IMPLANT
PACK ICE MAXI GEL EZY WRAP (MISCELLANEOUS) ×3 IMPLANT
PACK TOTAL KNEE CUSTOM (KITS) ×3 IMPLANT
PIN STEINMAN FIXATION KNEE (PIN) ×3 IMPLANT
PIN THREADED HEADED SIGMA (PIN) ×3 IMPLANT
PROTECTOR NERVE ULNAR (MISCELLANEOUS) ×3 IMPLANT
SET HNDPC FAN SPRY TIP SCT (DISPOSABLE) ×1 IMPLANT
STAPLER VISISTAT 35W (STAPLE) IMPLANT
SUT VIC AB 1 CTX 36 (SUTURE) ×2
SUT VIC AB 1 CTX36XBRD ANBCTR (SUTURE) ×1 IMPLANT
SUT VIC AB 2-0 CT1 27 (SUTURE) ×2
SUT VIC AB 2-0 CT1 TAPERPNT 27 (SUTURE) ×1 IMPLANT
SUT VIC AB 3-0 CT1 27 (SUTURE) ×2
SUT VIC AB 3-0 CT1 TAPERPNT 27 (SUTURE) ×1 IMPLANT
SYR CONTROL 10ML LL (SYRINGE) ×6 IMPLANT
TIBIAL BASE ROT PLAT SZ 7 KNEE (Knees) ×3 IMPLANT
TRAY FOLEY MTR SLVR 16FR STAT (SET/KITS/TRAYS/PACK) ×3 IMPLANT
WATER STERILE IRR 1000ML POUR (IV SOLUTION) ×6 IMPLANT
WRAP KNEE MAXI GEL POST OP (GAUZE/BANDAGES/DRESSINGS) ×3 IMPLANT
YANKAUER SUCT BULB TIP 10FT TU (MISCELLANEOUS) ×3 IMPLANT

## 2018-01-13 NOTE — Anesthesia Procedure Notes (Signed)
Spinal  Start time: 01/13/2018 3:35 PM End time: 01/13/2018 3:38 PM Staffing Anesthesiologist: Effie Berkshire, MD Performed: anesthesiologist  Preanesthetic Checklist Completed: patient identified, site marked, surgical consent, pre-op evaluation, timeout performed, IV checked, risks and benefits discussed and monitors and equipment checked Spinal Block Patient position: sitting Prep: site prepped and draped and DuraPrep Location: L3-4 Injection technique: single-shot Needle Needle type: Pencan  Needle gauge: 24 G Needle length: 10 cm Needle insertion depth: 10 cm Additional Notes Patient tolerated well. No immediate complications.

## 2018-01-13 NOTE — Op Note (Signed)
PATIENT ID:      Jeremiah Wright  MRN:     [id number] DOB/AGE:    1941/01/22 / 77 y.o.       OPERATIVE REPORT    DATE OF PROCEDURE:  01/13/2018       PREOPERATIVE DIAGNOSIS:   LEFT KNEE  OSTEOARTHRITIS      Estimated body mass index is 33.73 kg/m as calculated from the following:   Height as of this encounter: 5\' 6"  (1.676 m).   Weight as of this encounter: 94.8 kg.                                                        POSTOPERATIVE DIAGNOSIS:   LEFT KNEE  OSTEOARTHRITIS                                                                      PROCEDURE:  Procedure(s): TOTAL KNEE ARTHROPLASTY Using DepuyAttune RP implants #6L Femur, #6Tibia, 5 mm Attune RP bearing, 38 Patella     SURGEON: Kerin Salen    ASSISTANT:   Kerry Hough. Barton Dubois   (Present and scrubbed throughout the case, critical for assistance with exposure, retraction, instrumentation, and closure.)         ANESTHESIA: Sp9inal, 20cc Exparel, 50cc 0.25% Marcaine  EBL: 300 cc  FLUID REPLACEMENT: 1600 cc crystaloid  Tourniquet Time: None  Drains: None  Tranexamic Acid: 1gm IV, 2gm topical  Exparel: 266mg    COMPLICATIONS:  None         INDICATIONS FOR PROCEDURE: The patient has  LEFT KNEE  OSTEOARTHRITIS, Var deformities, XR shows bone on bone arthritis, lateral subluxation of tibia. Patient has failed all conservative measures including anti-inflammatory medicines, narcotics, attempts at exercise and weight loss, cortisone injections and viscosupplementation.  Risks and benefits of surgery have been discussed, questions answered.   DESCRIPTION OF PROCEDURE: The patient identified by armband, received  IV antibiotics, in the holding area at [facility]. Patient taken to the operating room, appropriate anesthetic monitors were attached, and Spinal anesthesia was  induced. IV Tranexamic acid was given. Lateral post and foot positioner applied to the table, the lower extremity was then prepped and draped in usual sterile  fashion from the toes to mid thigh. Time-out procedure was performed. The skin and subcutaneous tissue along the incision was injected with 20 cc of a mixture of Exparel and Marcaine solution, using a 20-gauge by 1-1/2 inch needle. We began the operation, with the knee flexed 130 degrees, by making the anterior midline incision starting at handbreadth above the patella going over the patella 1 cm medial to and 4 cm distal to the tibial tubercle. Small bleeders in the skin and the subcutaneous tissue identified and cauterized. Transverse retinaculum was incised and reflected medially and a medial parapatellar arthrotomy was accomplished. the patella was everted and theprepatellar fat pad resected. The superficial medial collateral ligament was then elevated from anterior to posterior along the proximal flare of the tibia and anterior half of the menisci resected. The knee was hyperflexed exposing bone on bone arthritis.  Peripheral and notch osteophytes as well as the cruciate ligaments were then resected. We continued to work our way around posteriorly along the proximal tibia, and externally rotated the tibia subluxing it out from underneath the femur. A McHale retractor was placed through the notch and a lateral Hohmann retractor placed, and we then drilled through the proximal tibia in line with the axis of the tibia followed by an intramedullary guide rod and 2-degree posterior slope cutting guide. The tibial cutting guide, 4 degree posterior sloped, was pinned into place allowing resection of 0 mm of bone medially and 12 mm of bone laterally. Satisfied with the tibial resection, we then entered the distal femur 2 mm anterior to the PCL origin with the intramedullary guide rod and applied the distal femoral cutting guide set at 9 mm, with 5 degrees of valgus. This was pinned along the epicondylar axis. At this point, the distal femoral cut was accomplished without difficulty. We then sized for a #6L femoral  component and pinned the guide in 3 degrees of external rotation. The chamfer cutting guide was pinned into place. The anterior, posterior, and chamfer cuts were accomplished without difficulty followed by the Attune RP box cutting guide and the box cut. We also removed posterior osteophytes from the posterior femoral condyles. The posterior capsule was injected with Exparel solution. The knee was brought into full extension. We checked our extension gap and fit a 5 mm bearing. Distracting in extension with a lamina spreader,  bleeders in the posterior capsule, Posterior medial and posterior lateral right down and cauterized.  The transexamic acid-soaked sponge was then placed in the gap of the knee and extension. The knee was flexed 30. The posterior patella cut was accomplished with the 9.5 mm Attune cutting guide, sized for a 67mm dome, and the fixation pegs drilled.The knee was then once again hyperflexed exposing the proximal tibia. We sized for a # 6 tibial base plate, applied the smokestack and the conical reamer followed by the the Delta fin keel punch. We then hammered into place the Attune RP trial femoral component, drilled the lugs, inserted a  5 mm trial bearing, trial patellar button, and took the knee through range of motion from 0-130 degrees. Medial and lateral ligamentous stability was checked. No thumb pressure was required for patellar Tracking. The tourniquet was not used. All trial components were removed, mating surfaces irrigated with pulse lavage, and dried with suction and sponges. 10 cc of the Exparel solution was applied to the cancellus bone of the patella distal femur and proximal tibia.  After waiting 30 seconds, the bony surfaces were again, dried with sponges. A double batch of DePuy HV cement was mixed and applied to all bony metallic mating surfaces except for the posterior condyles of the femur itself. In order, we hammered into place the tibial tray and removed excess cement,  the femoral component and removed excess cement. The final Attune RP bearing was inserted, and the knee brought to full extension with compression. The patellar button was clamped into place, and excess cement removed. The knee was held at 30 flexion with compression, while the cement cured. The wound was irrigated out with normal saline solution pulse lavage. The rest of the Exparel was injected into the parapatellar arthrotomy, subcutaneous tissues, and periosteal tissues. The parapatellar arthrotomy was closed with running #1 Vicryl suture. The subcutaneous tissue with 0 and 2-0 undyed Vicryl suture, and the skin with running 3-0 SQ vicryl. An Aquacil and Ace wrap were  applied. The patient was taken to recovery room without difficulty.   Kerin Salen 01/13/2018, 5:01 PM

## 2018-01-13 NOTE — Progress Notes (Signed)
Assisted Dr. Hollis with left, ultrasound guided, adductor canal block. Side rails up, monitors on throughout procedure. See vital signs in flow sheet. Tolerated Procedure well.  

## 2018-01-13 NOTE — Transfer of Care (Signed)
Immediate Anesthesia Transfer of Care Note  Patient: Jeremiah Wright  Procedure(s) Performed: TOTAL KNEE ARTHROPLASTY (Left )  Patient Location: PACU  Anesthesia Type:Spinal and MAC combined with regional for post-op pain  Level of Consciousness: awake, alert , oriented and patient cooperative  Airway & Oxygen Therapy: Patient Spontanous Breathing and Patient connected to face mask oxygen  Post-op Assessment: Report given to RN and Post -op Vital signs reviewed and stable  Post vital signs: Reviewed and stable  Last Vitals:  Vitals Value Taken Time  BP 95/48 01/13/2018  5:38 PM  Temp    Pulse 63 01/13/2018  5:42 PM  Resp 20 01/13/2018  5:42 PM  SpO2 100 % 01/13/2018  5:42 PM  Vitals shown include unvalidated device data.  Last Pain:  Vitals:   01/13/18 1424  TempSrc:   PainSc: 0-No pain         Complications: No apparent anesthesia complications

## 2018-01-13 NOTE — Interval H&P Note (Signed)
History and Physical Interval Note:  01/13/2018 3:14 PM  Jeremiah Wright  has presented today for surgery, with the diagnosis of LEFT KNEE  OSTEOARTHRITIS  The various methods of treatment have been discussed with the patient and family. After consideration of risks, benefits and other options for treatment, the patient has consented to  Procedure(s): TOTAL KNEE ARTHROPLASTY (Left) as a surgical intervention .  The patient's history has been reviewed, patient examined, no change in status, stable for surgery.  I have reviewed the patient's chart and labs.  Questions were answered to the patient's satisfaction.     Kerin Salen

## 2018-01-13 NOTE — Anesthesia Preprocedure Evaluation (Addendum)
Anesthesia Evaluation  Patient identified by MRN, date of birth, ID band Patient awake    Reviewed: Allergy & Precautions, NPO status , Patient's Chart, lab work & pertinent test results  Airway Mallampati: II  TM Distance: >3 FB Neck ROM: Full    Dental  (+) Edentulous Upper, Dental Advisory Given   Pulmonary former smoker,    breath sounds clear to auscultation       Cardiovascular hypertension, Pt. on home beta blockers  Rhythm:Regular Rate:Normal     Neuro/Psych negative neurological ROS     GI/Hepatic Neg liver ROS, GERD  ,  Endo/Other  negative endocrine ROS  Renal/GU negative Renal ROS     Musculoskeletal  (+) Arthritis ,   Abdominal Normal abdominal exam  (+)   Peds  Hematology negative hematology ROS (+)   Anesthesia Other Findings   Reproductive/Obstetrics                            Lab Results  Component Value Date   WBC 9.1 01/07/2018   HGB 13.4 01/07/2018   HCT 41.6 01/07/2018   MCV 96.5 01/07/2018   PLT 307 01/07/2018   Lab Results  Component Value Date   CREATININE 1.49 (H) 01/07/2018   BUN 26 (H) 01/07/2018   NA 142 01/07/2018   K 4.4 01/07/2018   CL 108 01/07/2018   CO2 26 01/07/2018   Lab Results  Component Value Date   INR 1.06 11/20/2011   INR 0.95 03/14/2009   EKG: normal sinus rhythm.   Anesthesia Physical Anesthesia Plan  ASA: II  Anesthesia Plan: Spinal   Post-op Pain Management:  Regional for Post-op pain   Induction: Intravenous  PONV Risk Score and Plan: 3 and Ondansetron, Dexamethasone and Treatment may vary due to age or medical condition  Airway Management Planned: Natural Airway and Simple Face Mask  Additional Equipment: None  Intra-op Plan:   Post-operative Plan:   Informed Consent: I have reviewed the patients History and Physical, chart, labs and discussed the procedure including the risks, benefits and alternatives for  the proposed anesthesia with the patient or authorized representative who has indicated his/her understanding and acceptance.     Plan Discussed with: CRNA  Anesthesia Plan Comments:        Anesthesia Quick Evaluation

## 2018-01-13 NOTE — Anesthesia Procedure Notes (Signed)
Anesthesia Regional Block: Adductor canal block   Pre-Anesthetic Checklist: ,, timeout performed, Correct Patient, Correct Site, Correct Laterality, Correct Procedure, Correct Position, site marked, Risks and benefits discussed,  Surgical consent,  Pre-op evaluation,  At surgeon's request and post-op pain management  Laterality: Right and Left  Prep: chloraprep       Needles:  Injection technique: Single-shot  Needle Type: Echogenic Stimulator Needle     Needle Length: 9cm  Needle Gauge: 21     Additional Needles:   Procedures:,,,, ultrasound used (permanent image in chart),,,,  Narrative:  Start time: 01/13/2018 2:08 PM End time: 01/13/2018 2:15 PM Injection made incrementally with aspirations every 5 mL.  Performed by: Personally  Anesthesiologist: Effie Berkshire, MD  Additional Notes: Patient tolerated the procedure well. Local anesthetic introduced in an incremental fashion under minimal resistance after negative aspirations. No paresthesias were elicited. After completion of the procedure, no acute issues were identified and patient continued to be monitored by RN.

## 2018-01-13 NOTE — Anesthesia Procedure Notes (Signed)
Procedure Name: MAC Date/Time: 01/13/2018 3:31 PM Performed by: West Pugh, CRNA Pre-anesthesia Checklist: Patient identified, Emergency Drugs available, Suction available, Patient being monitored and Timeout performed Patient Re-evaluated:Patient Re-evaluated prior to induction Oxygen Delivery Method: Simple face mask and Nasal cannula Preoxygenation: Pre-oxygenation with 100% oxygen Induction Type: IV induction Placement Confirmation: positive ETCO2 Dental Injury: Teeth and Oropharynx as per pre-operative assessment

## 2018-01-13 NOTE — Anesthesia Postprocedure Evaluation (Signed)
Anesthesia Post Note  Patient: Jeremiah Wright  Procedure(s) Performed: TOTAL KNEE ARTHROPLASTY (Left )     Patient location during evaluation: PACU Anesthesia Type: Spinal Level of consciousness: awake and alert Pain management: pain level controlled Vital Signs Assessment: post-procedure vital signs reviewed and stable Respiratory status: spontaneous breathing and respiratory function stable Cardiovascular status: blood pressure returned to baseline and stable Postop Assessment: spinal receding Anesthetic complications: no    Last Vitals:  Vitals:   01/13/18 1830 01/13/18 1845  BP: 126/71 130/64  Pulse: 60 67  Resp: 18 (!) 21  Temp: (!) 36.4 C   SpO2: 99% 99%    Last Pain:  Vitals:   01/13/18 1845  TempSrc:   PainSc: 0-No pain                 Makinzie Considine DANIEL

## 2018-01-14 ENCOUNTER — Other Ambulatory Visit: Payer: Self-pay

## 2018-01-14 ENCOUNTER — Encounter (HOSPITAL_COMMUNITY): Payer: Self-pay | Admitting: Orthopedic Surgery

## 2018-01-14 DIAGNOSIS — Z96652 Presence of left artificial knee joint: Secondary | ICD-10-CM | POA: Diagnosis not present

## 2018-01-14 DIAGNOSIS — M1712 Unilateral primary osteoarthritis, left knee: Secondary | ICD-10-CM | POA: Diagnosis not present

## 2018-01-14 LAB — BASIC METABOLIC PANEL
Anion gap: 6 (ref 5–15)
BUN: 20 mg/dL (ref 8–23)
CHLORIDE: 110 mmol/L (ref 98–111)
CO2: 24 mmol/L (ref 22–32)
Calcium: 8.7 mg/dL — ABNORMAL LOW (ref 8.9–10.3)
Creatinine, Ser: 1.43 mg/dL — ABNORMAL HIGH (ref 0.61–1.24)
GFR calc Af Amer: 55 mL/min — ABNORMAL LOW (ref >60)
GFR calc non Af Amer: 47 mL/min — ABNORMAL LOW (ref >60)
Glucose, Bld: 169 mg/dL — ABNORMAL HIGH (ref 70–99)
Potassium: 4.5 mmol/L (ref 3.5–5.1)
Sodium: 140 mmol/L (ref 135–145)

## 2018-01-14 LAB — CBC
HCT: 36.9 % — ABNORMAL LOW (ref 39.0–52.0)
HEMOGLOBIN: 11.8 g/dL — AB (ref 13.0–17.0)
MCH: 30.5 pg (ref 26.0–34.0)
MCHC: 32 g/dL (ref 30.0–36.0)
MCV: 95.3 fL (ref 80.0–100.0)
Platelets: 286 10*3/uL (ref 150–400)
RBC: 3.87 MIL/uL — ABNORMAL LOW (ref 4.22–5.81)
RDW: 12 % (ref 11.5–15.5)
WBC: 15.4 10*3/uL — ABNORMAL HIGH (ref 4.0–10.5)
nRBC: 0 % (ref 0.0–0.2)

## 2018-01-14 NOTE — Discharge Summary (Signed)
Patient ID: Jeremiah Wright MRN: [id number] DOB/AGE: Sep 07, 1940 77 y.o.  Admit date: 01/13/2018 Discharge date: 01/14/2018  Admission Diagnoses:  Principal Problem:   Degenerative arthritis of left knee Active Problems:   S/P total knee arthroplasty, left   Discharge Diagnoses:  Same  Past Medical History:  Diagnosis Date  . Arthritis    SHOULDERS AND KNEES - ARTHRITIS AND PAIN  . Bladder cancer ([place])    "[name]"   prostate  . GERD (gastroesophageal reflux disease)    under control  . Heart murmur    PT TOLD SLIGHT HEART MURMUR - DOESN'T CAUSE ANY PROBLEMS  . History of diverticulitis of colon   . Hypertension   . Insomnia     Surgeries: Procedure(s): TOTAL KNEE ARTHROPLASTY on 01/13/2018   Consultants:   Discharged Condition: Improved  Hospital Course: CHASEN MENDELL is an 77 y.o. male who was admitted 01/13/2018 for operative treatment ofDegenerative arthritis of left knee. Patient has severe unremitting pain that affects sleep, daily activities, and work/hobbies. After pre-op clearance the patient was taken to the operating room on 01/13/2018 and underwent  Procedure(s): TOTAL KNEE ARTHROPLASTY.    Patient was given perioperative antibiotics:  Anti-infectives (From admission, onward)   Start     Dose/Rate Route Frequency Ordered Stop   01/13/18 1315  ceFAZolin (ANCEF) IVPB 2g/100 mL premix     2 g 200 mL/hr over 30 Minutes Intravenous On call to O.R. 01/13/18 1301 01/13/18 1609       Patient was given sequential compression devices, early ambulation, and chemoprophylaxis to prevent DVT.  Patient benefited maximally from hospital stay and there were no complications.    Recent vital signs:  Patient Vitals for the past 24 hrs:  BP Temp Temp src Pulse Resp SpO2 Height Weight  01/14/18 0618 130/81 98.3 F (36.8 C) Oral (Abnormal) 101 18 97 % no documentation no documentation  01/14/18 0241 (Abnormal) 143/87 98.8 F (37.1 C) Oral (Abnormal) 105 18 98 % no  documentation no documentation  01/13/18 2226 132/76 98.1 F (36.7 C) Oral 78 16 97 % no documentation no documentation  01/13/18 2129 138/82 97.9 F (36.6 C) Oral 73 18 100 % no documentation no documentation  01/13/18 2041 134/71 (Abnormal) 97.5 F (36.4 C) no documentation 66 14 99 % no documentation no documentation  01/13/18 1958 133/87 no documentation no documentation 65 18 99 % no documentation no documentation  01/13/18 1915 139/78 (Abnormal) 97.5 F (36.4 C) no documentation 80 (Abnormal) 25 96 % no documentation no documentation  01/13/18 1900 127/71 no documentation no documentation 78 (Abnormal) 23 100 % no documentation no documentation  01/13/18 1845 130/64 no documentation no documentation 67 (Abnormal) 21 99 % no documentation no documentation  01/13/18 1830 126/71 (Abnormal) 97.5 F (36.4 C) no documentation 60 18 99 % no documentation no documentation  01/13/18 1815 110/70 no documentation no documentation 67 20 100 % no documentation no documentation  01/13/18 1800 108/62 no documentation no documentation 62 16 99 % no documentation no documentation  01/13/18 1745 (Abnormal) 99/58 no documentation no documentation 62 19 100 % no documentation no documentation  01/13/18 1738 (Abnormal) 95/48 97.8 F (36.6 C) no documentation 65 18 100 % no documentation no documentation  01/13/18 1515 138/78 no documentation no documentation 63 14 100 % no documentation no documentation  01/13/18 1500 (Abnormal) 141/88 no documentation no documentation 68 (Abnormal) 21 100 % no documentation no documentation  01/13/18 1445 136/81 no documentation no documentation 64 17 100 %  no documentation no documentation  01/13/18 1430 131/71 no documentation no documentation 67 17 100 % no documentation no documentation  01/13/18 1424 no documentation no documentation no documentation 63 (Abnormal) 21 100 % no documentation no documentation  01/13/18 1423 no documentation no documentation no  documentation 64 19 100 % no documentation no documentation  01/13/18 1422 no documentation no documentation no documentation 73 20 100 % no documentation no documentation  01/13/18 1421 no documentation no documentation no documentation 66 19 100 % no documentation no documentation  01/13/18 1420 (Abnormal) 145/78 no documentation no documentation 66 18 100 % no documentation no documentation  01/13/18 1419 no documentation no documentation no documentation 69 20 100 % no documentation no documentation  01/13/18 1418 no documentation no documentation no documentation 68 19 100 % no documentation no documentation  01/13/18 1417 no documentation no documentation no documentation 66 20 100 % no documentation no documentation  01/13/18 1416 no documentation no documentation no documentation 65 17 100 % no documentation no documentation  01/13/18 1415 135/77 no documentation no documentation 68 (Abnormal) 24 100 % no documentation no documentation  01/13/18 1414 no documentation no documentation no documentation 70 (Abnormal) 22 100 % no documentation no documentation  01/13/18 1413 no documentation no documentation no documentation 68 (Abnormal) 21 100 % no documentation no documentation  01/13/18 1412 no documentation no documentation no documentation 67 19 100 % no documentation no documentation  01/13/18 1411 no documentation no documentation no documentation 67 (Abnormal) 22 100 % no documentation no documentation  01/13/18 1410 (Abnormal) 151/82 no documentation no documentation 69 (Abnormal) 26 100 % no documentation no documentation  01/13/18 1409 no documentation no documentation no documentation 67 18 100 % no documentation no documentation  01/13/18 1355 no documentation no documentation no documentation no documentation no documentation no documentation 5\' 6"  (1.676 m) 94.8 kg  01/13/18 1312 (Abnormal) 162/99 98.1 F (36.7 C) Oral 72 16 100 % no documentation no documentation  01/13/18 1302  no documentation no documentation no documentation no documentation no documentation no documentation 5\' 6"  (1.676 m) 94.8 kg     Recent laboratory studies:  Recent Labs    01/14/18 0354  WBC 15.4*  HGB 11.8*  HCT 36.9*  PLT 286  NA 140  K 4.5  CL 110  CO2 24  BUN 20  CREATININE 1.43*  GLUCOSE 169*  CALCIUM 8.7*     Discharge Medications:   Allergies as of 01/14/2018   No Known Allergies     Medication List    Stop taking these medications   traMADol 50 MG tablet Commonly known as:  ULTRAM     Take these medications   aspirin EC 81 MG tablet Take 1 tablet (81 mg total) by mouth 2 (two) times daily.   carvedilol 12.5 MG tablet Commonly known as:  COREG Take 12.5 mg by mouth 2 (two) times daily with a meal.   hydrocortisone cream 1 % Apply 1 application topically 2 (two) times daily as needed for itching.   LORazepam 0.5 MG tablet Commonly known as:  ATIVAN Take 0.5 mg by mouth at bedtime as needed for anxiety.   oxyCODONE-acetaminophen 5-325 MG tablet Commonly known as:  PERCOCET/ROXICET Take 1 tablet by mouth every 4 (four) hours as needed for severe pain.   tiZANidine 2 MG tablet Commonly known as:  ZANAFLEX Take 1 tablet (2 mg total) by mouth every 6 (six) hours as needed.   valsartan 40 MG tablet Commonly known as:  DIOVAN Take 20 mg by mouth 2 (two) times daily.   zolpidem 10 MG tablet Commonly known as:  AMBIEN Take 10 mg by mouth at bedtime as needed for sleep.        Durable Medical Equipment  (From admission, onward)         Start     Ordered   01/13/18 1936  DME Walker rolling  Once    Question:  Patient needs a walker to treat with the following condition  Answer:  Status post total left knee replacement   01/13/18 1936   01/13/18 1936  DME 3 n 1  Once     01/13/18 1936           Discharge Care Instructions  (From admission, onward)         Start     Ordered   01/14/18 0000  Change dressing    Comments:  Change  dressing only if drainage exceeds 40% of window on dressing   01/14/18 0719          Diagnostic Studies: Dg Chest 2 View  Result Date: 12/23/2017 CLINICAL DATA:  77 year old male with history of bladder cancer. EXAM: CHEST - 2 VIEW COMPARISON:  Chest x-ray 12/17/2016. FINDINGS: Mild elevation of the left hemidiaphragm, similar to the prior study. Lung volumes are normal. No consolidative airspace disease. No pleural effusions. No pneumothorax. No pulmonary nodule or mass noted. Pulmonary vasculature and the cardiomediastinal silhouette are within normal limits. Status post left shoulder arthroplasty. IMPRESSION: 1. No radiographic evidence of suggest metastatic disease in the thorax. 2. No acute cardiopulmonary disease. Electronically Signed   By: Vinnie Langton M.D.   On: 12/23/2017 08:09    Disposition: Discharge disposition: 01-Home or Self Care       Discharge Instructions    Call MD / Call 911   Complete by:  As directed    If you experience chest pain or shortness of breath, CALL 911 and be transported to the hospital emergency room.  If you develope a fever above 101 F, pus (white drainage) or increased drainage or redness at the wound, or calf pain, call your surgeon's office.   Change dressing   Complete by:  As directed    Change dressing only if drainage exceeds 40% of window on dressing   Constipation Prevention   Complete by:  As directed    Drink plenty of fluids.  Prune juice may be helpful.  You may use a stool softener, such as Colace (over the counter) 100 mg twice a day.  Use MiraLax (over the counter) for constipation as needed.   Diet - low sodium heart healthy   Complete by:  As directed    Increase activity slowly as tolerated   Complete by:  As directed       Follow-up Information    Frederik Pear, MD In 2 weeks.   Specialty:  Orthopedic Surgery Contact information: [place] [address] [phone]            Signed: Kerin Salen 01/14/2018, 7:19 AM

## 2018-01-14 NOTE — Progress Notes (Signed)
PATIENT ID: Jeremiah Wright  MRN: [id number]  DOB/AGE:  Mar 28, 1940 / 77 y.o.  1 Day Post-Op Procedure(s) (LRB): TOTAL KNEE ARTHROPLASTY (Left)    PROGRESS NOTE Subjective: Patient is alert, oriented, no Nausea, no Vomiting, yes passing gas. Taking PO well. Denies SOB, Chest or Calf Pain. Using Incentive Spirometer, PAS in place. Ambulate WBAT, Patient reports pain as 2/10 .    Objective: Vital signs in last 24 hours: Vitals:   01/13/18 2129 01/13/18 2226 01/14/18 0241 01/14/18 0618  BP: 138/82 132/76 (Abnormal) 143/87 130/81  Pulse: 73 78 (Abnormal) 105 (Abnormal) 101  Resp: 18 16 18 18   Temp: 97.9 F (36.6 C) 98.1 F (36.7 C) 98.8 F (37.1 C) 98.3 F (36.8 C)  TempSrc: Oral Oral Oral Oral  SpO2: 100% 97% 98% 97%  Weight:      Height:          Intake/Output from previous day: I/O last 3 completed shifts: In: 3331.7 [P.O.:360; I.V.:2671.7; IV Piggyback:300] Out: 2745 [Urine:2545; Blood:200]   Intake/Output this shift: No intake/output data recorded.   LABORATORY DATA: Recent Labs    01/14/18 0354  WBC 15.4*  HGB 11.8*  HCT 36.9*  PLT 286  NA 140  K 4.5  CL 110  CO2 24  BUN 20  CREATININE 1.43*  GLUCOSE 169*  CALCIUM 8.7*    Examination: Neurologically intact ABD soft Neurovascular intact Sensation intact distally Intact pulses distally Dorsiflexion/Plantar flexion intact Incision: dressing C/D/I No cellulitis present Compartment soft}  Assessment:   1 Day Post-Op Procedure(s) (LRB): TOTAL KNEE ARTHROPLASTY (Left) ADDITIONAL DIAGNOSIS: Expected Acute Blood Loss Anemia, hx bladder cancer  Patient's anticipated LOS is less than 2 midnights, meeting these requirements: - Younger than 57 - Lives within 1 hour of care - Has a competent adult at home to recover with post-op recover - NO history of  - Chronic pain requiring opiods  - Diabetes  - Coronary Artery Disease  - Heart failure  - Heart attack  - Stroke  - DVT/VTE  - Cardiac arrhythmia  -  Respiratory Failure/COPD  - Renal failure  - Anemia  - Advanced Liver disease       Plan: PT/OT WBAT, AROM and PROM  DVT Prophylaxis:  SCDx72hrs, ASA 81 mg BID x 2 weeks DISCHARGE PLAN: Home, When patient passes PT goals today DISCHARGE NEEDS: HHPT, Walker and 3-in-1 comode seat     Kerin Salen 01/14/2018, 7:16 AM Patient ID: Jeremiah Wright, male   DOB: 11/04/1940, 77 y.o.   MRN: [id number]

## 2018-01-14 NOTE — Progress Notes (Signed)
Physical Therapy Treatment Patient Details Name: Jeremiah Wright MRN: [id number] DOB: [birth year] Today's Date: 01/14/2018    History of Present Illness 77 yo male s/p L TKR on 01/13/18. PMH includes bladder cancer with multiple cystoscopy procedures and with bladder tumor resection (pt with foley), L shoulder arthritis wiht reverse shoulder arthroplasty, GERD, heart murmur, HTN.    PT Comments    Pt is progressing well with mobility and is ready to DC home from PT standpoint. He ambulated 160' with RW, performed HEP with good technique, and completed stair training. He is independent with L SLR, L knee AAROM 5-100* in sitting.    Follow Up Recommendations  Follow surgeon's recommendation for DC plan and follow-up therapies;Supervision for mobility/OOB(HHPT )     Equipment Recommendations  Rolling walker with 5" wheels    Recommendations for Other Services       Precautions / Restrictions Precautions Precautions: Fall Restrictions Weight Bearing Restrictions: No Other Position/Activity Restrictions: WBAT     Mobility  Bed Mobility Overal bed mobility: Needs Assistance Bed Mobility: Supine to Sit     Supine to sit: Min guard;HOB elevated     General bed mobility comments: up in recliner  Transfers Overall transfer level: Needs assistance Equipment used: Rolling walker (2 wheeled) Transfers: Sit to/from Stand Sit to Stand: Supervision         General transfer comment: Verbal cuing for hand placement. Pt with weight shifting without LLE buckling.   Ambulation/Gait Ambulation/Gait assistance: Supervision Gait Distance (Feet): 160 Feet Assistive device: Rolling walker (2 wheeled) Gait Pattern/deviations: Step-through pattern;Decreased stride length Gait velocity: decr    General Gait Details: good sequencing, no loss of balance nor buckling   Stairs Stairs: Yes Stairs assistance: Min guard Stair Management: One rail Left;Forwards;Step to pattern;With  cane Number of Stairs: 3 General stair comments: VCs sequencing   Wheelchair Mobility    Modified Rankin (Stroke Patients Only)       Balance Overall balance assessment: Modified Independent                                          Cognition Arousal/Alertness: Awake/alert Behavior During Therapy: WFL for tasks assessed/performed Overall Cognitive Status: Within Functional Limits for tasks assessed                                        Exercises Total Joint Exercises Ankle Circles/Pumps: AROM;Both;10 reps;Seated Quad Sets: AROM;Left;10 reps;Seated Towel Squeeze: AROM;Both;10 reps;Seated Short Arc Quad: AROM;Left;10 reps;Seated Heel Slides: AAROM;Left;Seated;10 reps Hip ABduction/ADduction: AROM;Left;10 reps;Supine Straight Leg Raises: AROM;Left;10 reps;Seated Long Arc Quad: AROM;Left;10 reps;Seated Knee Flexion: AAROM;Left;10 reps;AROM;Seated Goniometric ROM: 5-100* L knee AAROM seated    General Comments        Pertinent Vitals/Pain Pain Assessment: 0-10 Pain Score: 3  Pain Location: L knee Pain Descriptors / Indicators: Dull;Discomfort;Constant Pain Intervention(s): Limited activity within patient's tolerance;Monitored during session;Premedicated before session;Ice applied    Home Living Family/patient expects to be discharged to:: Private residence Living Arrangements: Spouse/significant other Available Help at Discharge: Family;Available 24 hours/day Type of Home: House Home Access: Stairs to enter Entrance Stairs-Rails: Left Home Layout: Two level;Able to live on main level with bedroom/bathroom Home Equipment: Kasandra Knudsen - single point;Bedside commode      Prior Function Level of Independence: Independent with assistive device(s)  Comments: Pt using cane for ambulation prior to surgery. Pt states "to be honest with you, I didn't use it all the time".    PT Goals (current goals can now be found in the care plan  section) Acute Rehab PT Goals Patient Stated Goal: go home, do yardwork PT Goal Formulation: With patient/family Time For Goal Achievement: 01/21/18 Potential to Achieve Goals: Good Progress towards PT goals: Progressing toward goals    Frequency    7X/week      PT Plan Current plan remains appropriate    Co-evaluation              AM-PAC PT "6 Clicks" Mobility   Outcome Measure  Help needed turning from your back to your side while in a flat bed without using bedrails?: None Help needed moving from lying on your back to sitting on the side of a flat bed without using bedrails?: A Little Help needed moving to and from a bed to a chair (including a wheelchair)?: A Little Help needed standing up from a chair using your arms (e.g., wheelchair or bedside chair)?: A Little Help needed to walk in hospital room?: None Help needed climbing 3-5 steps with a railing? : A Little 6 Click Score: 20    End of Session Equipment Utilized During Treatment: Gait belt Activity Tolerance: Patient tolerated treatment well Patient left: in chair;with call bell/phone within reach;with family/visitor present(Pt verbally agrees to press call button and get assist before mobilizing ) Nurse Communication: Mobility status PT Visit Diagnosis: Other abnormalities of gait and mobility (R26.89);Difficulty in walking, not elsewhere classified (R26.2)     Time: 9528-4132 PT Time Calculation (min) (ACUTE ONLY): 19 min  Charges:  $Gait Training: 8-22 mins                     Blondell Reveal Kistler PT 01/14/2018  Acute Rehabilitation Services Pager [phone] Office [phone]

## 2018-01-14 NOTE — Care Management Note (Signed)
Case Management Note  Patient Details  Name: MUHAMMADALI RIES MRN: [id number] Date of Birth: 03/05/1940  Subjective/Objective:    [phone] planning, spoke with patient and spouse at beside. Chose [facility] for [facility] services, PT to eval and treat.             Action/Plan: Contacted [facility] for referral. They have accepted. Has RW and 3-n-1. [phone]    Expected Discharge Date:  01/14/18               Expected Discharge Plan:  [place]  In-House Referral:  NA  Discharge planning Services  CM Consult  Post Acute Care Choice:  Home Health Choice offered to:  Patient  DME Arranged:  N/A DME Agency:  NA  HH Arranged:  PT [facility] Agency:  [place]  Status of Service:  Completed, signed off  If discussed at [place] of Stay Meetings, dates discussed:    Additional Comments:  Guadalupe Maple, RN 01/14/2018, 1:56 PM

## 2018-01-14 NOTE — Progress Notes (Signed)
Patient discharged to home w/ family. Given all belongings, instructions, prescriptions, equipment. Verbalized understanding of all instructions. Escorted to pov via w/c. 

## 2018-01-14 NOTE — Evaluation (Signed)
Physical Therapy Evaluation Patient Details Name: Jeremiah Wright MRN: [id number] DOB: Apr 04, 1940 Today's Date: 01/14/2018   History of Present Illness  77 yo male s/p L TKR on 01/13/18. PMH includes bladder cancer with multiple cystoscopy procedures and with bladder tumor resection (pt with foley), L shoulder arthritis wiht reverse shoulder arthroplasty, GERD, heart murmur, HTN.  Clinical Impression   Pt presents with L knee pain, decreased L knee ROM, increased time and effort to perform mobility tasks, and decreased activity tolerance. Pt to benefit from acute PT to address deficits. Pt ambulated 100 ft with step-through gait, and was very steady with walking. Pt performs exercises well, requires verbal cues for technique this session. Exercise handout provided. Will see pt this afternoon to practice stair navigation, gait, and exercises. PT to progress mobility as tolerated, and will continue to follow acutely.      Follow Up Recommendations Follow surgeon's recommendation for DC plan and follow-up therapies;Supervision for mobility/OOB(HHPT )    Equipment Recommendations  Rolling walker with 5" wheels    Recommendations for Other Services       Precautions / Restrictions Precautions Precautions: Fall Restrictions Weight Bearing Restrictions: No Other Position/Activity Restrictions: WBAT       Mobility  Bed Mobility Overal bed mobility: Needs Assistance Bed Mobility: Supine to Sit     Supine to sit: Min guard;HOB elevated     General bed mobility comments: Min guard for safety. Verbal cuing for sequencing.   Transfers Overall transfer level: Needs assistance Equipment used: Rolling walker (2 wheeled) Transfers: Sit to/from Stand Sit to Stand: Min guard         General transfer comment: Min guard for safety. Verbal cuing for hand placement. Pt with weight shifting without LLE buckling.   Ambulation/Gait Ambulation/Gait assistance: Min guard Gait Distance (Feet):  100 Feet Assistive device: Rolling walker (2 wheeled) Gait Pattern/deviations: Step-through pattern;Decreased stride length Gait velocity: decr    General Gait Details: Min guard for safety. Verbal cuing for sequencing, placement in RW.   Stairs            Wheelchair Mobility    Modified Rankin (Stroke Patients Only)       Balance Overall balance assessment: Mild deficits observed, not formally tested                                           Pertinent Vitals/Pain Pain Assessment: 0-10 Pain Score: 4  Pain Location: R knee  Pain Descriptors / Indicators: Dull;Discomfort;Constant Pain Intervention(s): Limited activity within patient's tolerance;Repositioned;Ice applied;Monitored during session    [place] expects to be discharged to:: Private residence Living Arrangements: Spouse/significant other Available Help at Discharge: Family;Available 24 hours/day Type of Home: House Home Access: Stairs to enter Entrance Stairs-Rails: Left Entrance Stairs-Number of Steps: 3 Home Layout: Two level;Able to live on main level with bedroom/bathroom Home Equipment: Kasandra Knudsen - single point;Bedside commode      Prior Function Level of Independence: Independent with assistive device(s)         Comments: Pt using cane for ambulation prior to surgery. Pt states "to be honest with you, I didn't use it all the time".      Hand Dominance   Dominant Hand: Right    Extremity/Trunk Assessment   Upper Extremity Assessment Upper Extremity Assessment: Overall WFL for tasks assessed    Lower Extremity Assessment Lower Extremity Assessment: Overall  WFL for tasks assessed;LLE deficits/detail LLE Deficits / Details: suspected post-surgical weakness; able to perform SLR, quad set, ankle pumps, heel slides  LLE Sensation: WNL    Cervical / Trunk Assessment Cervical / Trunk Assessment: Normal  Communication   Communication: No difficulties   Cognition Arousal/Alertness: Awake/alert Behavior During Therapy: WFL for tasks assessed/performed Overall Cognitive Status: Within Functional Limits for tasks assessed                                        General Comments      Exercises Total Joint Exercises Ankle Circles/Pumps: AROM;Both;10 reps;Seated Quad Sets: AROM;Left;10 reps;Seated Towel Squeeze: AROM;Both;10 reps;Seated Short Arc Quad: AROM;Left;10 reps;Seated Heel Slides: AAROM;Left;Seated;5 reps Hip ABduction/ADduction: AROM;Left;10 reps;Supine Straight Leg Raises: AROM;Left;10 reps;Seated Goniometric ROM: Knee aarom ~5-80*, limited by pain    Assessment/Plan    PT Assessment Patient needs continued PT services  PT Problem List Decreased strength;Pain;Decreased range of motion;Decreased activity tolerance;Decreased knowledge of use of DME;Decreased balance;Decreased mobility       PT Treatment Interventions DME instruction;Therapeutic activities;Gait training;Therapeutic exercise;Patient/family education;Stair training;Balance training;Functional mobility training    PT Goals (Current goals can be found in the Care Plan section)  Acute Rehab PT Goals Patient Stated Goal: go home  PT Goal Formulation: With patient Time For Goal Achievement: 01/21/18 Potential to Achieve Goals: Good    Frequency 7X/week   Barriers to discharge        Co-evaluation               AM-PAC PT "6 Clicks" Mobility  Outcome Measure Help needed turning from your back to your side while in a flat bed without using bedrails?: None Help needed moving from lying on your back to sitting on the side of a flat bed without using bedrails?: A Little Help needed moving to and from a bed to a chair (including a wheelchair)?: A Little Help needed standing up from a chair using your arms (e.g., wheelchair or bedside chair)?: A Little Help needed to walk in hospital room?: A Little Help needed climbing 3-5 steps with a  railing? : A Little 6 Click Score: 19    End of Session Equipment Utilized During Treatment: Gait belt Activity Tolerance: Patient tolerated treatment well Patient left: in chair;with call bell/phone within reach;with SCD's reapplied(Pt verbally agrees to press call button and get assist before mobilizing ) Nurse Communication: Mobility status PT Visit Diagnosis: Other abnormalities of gait and mobility (R26.89);Difficulty in walking, not elsewhere classified (R26.2)    Time: 1751-0258 PT Time Calculation (min) (ACUTE ONLY): 27 min   Charges:   PT Evaluation $PT Eval Low Complexity: 1 Low PT Treatments $Gait Training: 8-22 mins        Julien Girt, PT Acute Rehabilitation Services Pager [phone]  Office [phone]   Jeremiah Wright D Elonda Husky 01/14/2018, 12:35 PM

## 2018-01-15 DIAGNOSIS — Z471 Aftercare following joint replacement surgery: Secondary | ICD-10-CM | POA: Diagnosis not present

## 2018-01-15 DIAGNOSIS — Z8551 Personal history of malignant neoplasm of bladder: Secondary | ICD-10-CM | POA: Diagnosis not present

## 2018-01-15 DIAGNOSIS — Z906 Acquired absence of other parts of urinary tract: Secondary | ICD-10-CM | POA: Diagnosis not present

## 2018-01-15 DIAGNOSIS — Z7982 Long term (current) use of aspirin: Secondary | ICD-10-CM | POA: Diagnosis not present

## 2018-01-15 DIAGNOSIS — K219 Gastro-esophageal reflux disease without esophagitis: Secondary | ICD-10-CM | POA: Diagnosis not present

## 2018-01-15 DIAGNOSIS — Z96612 Presence of left artificial shoulder joint: Secondary | ICD-10-CM | POA: Diagnosis not present

## 2018-01-15 DIAGNOSIS — Z96652 Presence of left artificial knee joint: Secondary | ICD-10-CM | POA: Diagnosis not present

## 2018-01-15 DIAGNOSIS — Z87891 Personal history of nicotine dependence: Secondary | ICD-10-CM | POA: Diagnosis not present

## 2018-01-15 DIAGNOSIS — I10 Essential (primary) hypertension: Secondary | ICD-10-CM | POA: Diagnosis not present

## 2018-01-28 DIAGNOSIS — M25562 Pain in left knee: Secondary | ICD-10-CM | POA: Diagnosis not present

## 2018-02-11 DIAGNOSIS — Z96652 Presence of left artificial knee joint: Secondary | ICD-10-CM | POA: Diagnosis not present

## 2018-02-14 DIAGNOSIS — Z96652 Presence of left artificial knee joint: Secondary | ICD-10-CM | POA: Diagnosis not present

## 2018-02-18 DIAGNOSIS — Z96652 Presence of left artificial knee joint: Secondary | ICD-10-CM | POA: Diagnosis not present

## 2018-02-20 DIAGNOSIS — Z96652 Presence of left artificial knee joint: Secondary | ICD-10-CM | POA: Diagnosis not present

## 2018-02-24 DIAGNOSIS — D649 Anemia, unspecified: Secondary | ICD-10-CM | POA: Diagnosis not present

## 2018-02-24 DIAGNOSIS — I10 Essential (primary) hypertension: Secondary | ICD-10-CM | POA: Diagnosis not present

## 2018-02-24 DIAGNOSIS — R739 Hyperglycemia, unspecified: Secondary | ICD-10-CM | POA: Diagnosis not present

## 2018-02-24 DIAGNOSIS — M25562 Pain in left knee: Secondary | ICD-10-CM | POA: Diagnosis not present

## 2018-02-25 DIAGNOSIS — Z96652 Presence of left artificial knee joint: Secondary | ICD-10-CM | POA: Diagnosis not present

## 2018-02-28 DIAGNOSIS — M539 Dorsopathy, unspecified: Secondary | ICD-10-CM | POA: Diagnosis not present

## 2018-02-28 DIAGNOSIS — D649 Anemia, unspecified: Secondary | ICD-10-CM | POA: Diagnosis not present

## 2018-02-28 DIAGNOSIS — R739 Hyperglycemia, unspecified: Secondary | ICD-10-CM | POA: Diagnosis not present

## 2018-02-28 DIAGNOSIS — I10 Essential (primary) hypertension: Secondary | ICD-10-CM | POA: Diagnosis not present

## 2018-02-28 DIAGNOSIS — M25562 Pain in left knee: Secondary | ICD-10-CM | POA: Diagnosis not present

## 2018-02-28 DIAGNOSIS — G4700 Insomnia, unspecified: Secondary | ICD-10-CM | POA: Diagnosis not present

## 2018-03-04 DIAGNOSIS — Z96652 Presence of left artificial knee joint: Secondary | ICD-10-CM | POA: Diagnosis not present

## 2018-03-10 DIAGNOSIS — Z13820 Encounter for screening for osteoporosis: Secondary | ICD-10-CM | POA: Diagnosis not present

## 2018-03-11 DIAGNOSIS — Z96652 Presence of left artificial knee joint: Secondary | ICD-10-CM | POA: Diagnosis not present

## 2018-03-13 DIAGNOSIS — Z96652 Presence of left artificial knee joint: Secondary | ICD-10-CM | POA: Diagnosis not present

## 2018-03-25 DIAGNOSIS — Z471 Aftercare following joint replacement surgery: Secondary | ICD-10-CM | POA: Diagnosis not present

## 2018-03-25 DIAGNOSIS — M1712 Unilateral primary osteoarthritis, left knee: Secondary | ICD-10-CM | POA: Diagnosis not present

## 2018-03-25 DIAGNOSIS — Z96652 Presence of left artificial knee joint: Secondary | ICD-10-CM | POA: Diagnosis not present

## 2018-05-05 ENCOUNTER — Ambulatory Visit
Admission: RE | Admit: 2018-05-05 | Discharge: 2018-05-05 | Disposition: A | Discharge status: Home / Self Care | Payer: PPO | Source: Ambulatory Visit | Attending: Family Medicine | Admitting: Family Medicine

## 2018-05-05 ENCOUNTER — Other Ambulatory Visit: Payer: Self-pay | Admitting: Family Medicine

## 2018-05-05 ENCOUNTER — Other Ambulatory Visit: Payer: Self-pay

## 2018-05-05 DIAGNOSIS — M79605 Pain in left leg: Secondary | ICD-10-CM

## 2018-05-05 DIAGNOSIS — M7989 Other specified soft tissue disorders: Secondary | ICD-10-CM

## 2018-06-26 DIAGNOSIS — Z936 Other artificial openings of urinary tract status: Secondary | ICD-10-CM | POA: Diagnosis not present

## 2018-08-27 DIAGNOSIS — Z0000 Encounter for general adult medical examination without abnormal findings: Secondary | ICD-10-CM | POA: Diagnosis not present

## 2018-08-27 DIAGNOSIS — Z125 Encounter for screening for malignant neoplasm of prostate: Secondary | ICD-10-CM | POA: Diagnosis not present

## 2018-08-27 DIAGNOSIS — I10 Essential (primary) hypertension: Secondary | ICD-10-CM | POA: Diagnosis not present

## 2018-08-27 DIAGNOSIS — R319 Hematuria, unspecified: Secondary | ICD-10-CM | POA: Diagnosis not present

## 2018-08-27 DIAGNOSIS — R739 Hyperglycemia, unspecified: Secondary | ICD-10-CM | POA: Diagnosis not present

## 2018-08-27 DIAGNOSIS — E7800 Pure hypercholesterolemia, unspecified: Secondary | ICD-10-CM | POA: Diagnosis not present

## 2018-08-27 DIAGNOSIS — E538 Deficiency of other specified B group vitamins: Secondary | ICD-10-CM | POA: Diagnosis not present

## 2018-08-27 DIAGNOSIS — N390 Urinary tract infection, site not specified: Secondary | ICD-10-CM | POA: Diagnosis not present

## 2018-09-03 DIAGNOSIS — Z0000 Encounter for general adult medical examination without abnormal findings: Secondary | ICD-10-CM | POA: Diagnosis not present

## 2018-09-03 DIAGNOSIS — Z936 Other artificial openings of urinary tract status: Secondary | ICD-10-CM | POA: Diagnosis not present

## 2018-09-03 DIAGNOSIS — E7800 Pure hypercholesterolemia, unspecified: Secondary | ICD-10-CM | POA: Diagnosis not present

## 2018-09-03 DIAGNOSIS — M720 Palmar fascial fibromatosis [Dupuytren]: Secondary | ICD-10-CM | POA: Diagnosis not present

## 2018-09-03 DIAGNOSIS — R7309 Other abnormal glucose: Secondary | ICD-10-CM | POA: Diagnosis not present

## 2018-09-03 DIAGNOSIS — M1990 Unspecified osteoarthritis, unspecified site: Secondary | ICD-10-CM | POA: Diagnosis not present

## 2018-09-03 DIAGNOSIS — I10 Essential (primary) hypertension: Secondary | ICD-10-CM | POA: Diagnosis not present

## 2018-09-10 ENCOUNTER — Other Ambulatory Visit: Payer: Self-pay

## 2018-10-24 DIAGNOSIS — Z23 Encounter for immunization: Secondary | ICD-10-CM | POA: Diagnosis not present

## 2018-10-31 DIAGNOSIS — Z936 Other artificial openings of urinary tract status: Secondary | ICD-10-CM | POA: Diagnosis not present

## 2018-12-30 DIAGNOSIS — C700 Malignant neoplasm of cerebral meninges: Secondary | ICD-10-CM | POA: Diagnosis not present

## 2018-12-30 DIAGNOSIS — C61 Malignant neoplasm of prostate: Secondary | ICD-10-CM | POA: Diagnosis not present

## 2019-01-06 DIAGNOSIS — C679 Malignant neoplasm of bladder, unspecified: Secondary | ICD-10-CM | POA: Diagnosis not present

## 2019-01-06 DIAGNOSIS — C61 Malignant neoplasm of prostate: Secondary | ICD-10-CM | POA: Diagnosis not present

## 2019-01-06 DIAGNOSIS — C670 Malignant neoplasm of trigone of bladder: Secondary | ICD-10-CM | POA: Diagnosis not present

## 2019-01-13 DIAGNOSIS — C670 Malignant neoplasm of trigone of bladder: Secondary | ICD-10-CM | POA: Diagnosis not present

## 2019-01-13 DIAGNOSIS — K435 Parastomal hernia without obstruction or  gangrene: Secondary | ICD-10-CM | POA: Diagnosis not present

## 2019-01-13 DIAGNOSIS — C61 Malignant neoplasm of prostate: Secondary | ICD-10-CM | POA: Diagnosis not present

## 2019-02-25 DIAGNOSIS — R7309 Other abnormal glucose: Secondary | ICD-10-CM | POA: Diagnosis not present

## 2019-02-25 DIAGNOSIS — I10 Essential (primary) hypertension: Secondary | ICD-10-CM | POA: Diagnosis not present

## 2019-03-10 DIAGNOSIS — I10 Essential (primary) hypertension: Secondary | ICD-10-CM | POA: Diagnosis not present

## 2019-03-10 DIAGNOSIS — N183 Chronic kidney disease, stage 3 unspecified: Secondary | ICD-10-CM | POA: Diagnosis not present

## 2019-03-10 DIAGNOSIS — R7309 Other abnormal glucose: Secondary | ICD-10-CM | POA: Diagnosis not present

## 2019-03-10 DIAGNOSIS — D519 Vitamin B12 deficiency anemia, unspecified: Secondary | ICD-10-CM | POA: Diagnosis not present

## 2019-03-10 DIAGNOSIS — Z0000 Encounter for general adult medical examination without abnormal findings: Secondary | ICD-10-CM | POA: Diagnosis not present

## 2019-03-10 DIAGNOSIS — D649 Anemia, unspecified: Secondary | ICD-10-CM | POA: Diagnosis not present

## 2019-06-05 DIAGNOSIS — F419 Anxiety disorder, unspecified: Secondary | ICD-10-CM | POA: Diagnosis not present

## 2019-06-05 DIAGNOSIS — G8929 Other chronic pain: Secondary | ICD-10-CM | POA: Diagnosis not present

## 2019-06-05 DIAGNOSIS — M25561 Pain in right knee: Secondary | ICD-10-CM | POA: Diagnosis not present

## 2019-06-05 DIAGNOSIS — G4700 Insomnia, unspecified: Secondary | ICD-10-CM | POA: Diagnosis not present

## 2019-06-10 DIAGNOSIS — Z936 Other artificial openings of urinary tract status: Secondary | ICD-10-CM | POA: Diagnosis not present

## 2019-07-03 IMAGING — CR DG CHEST 2V
2 series · 2 of 2 positions shown · non-contrast
Comparison: Chest x-ray 12/17/2016.

CLINICAL DATA: 77-year-old male with history of bladder cancer.

EXAM:
CHEST - 2 VIEW

[w chest pa]
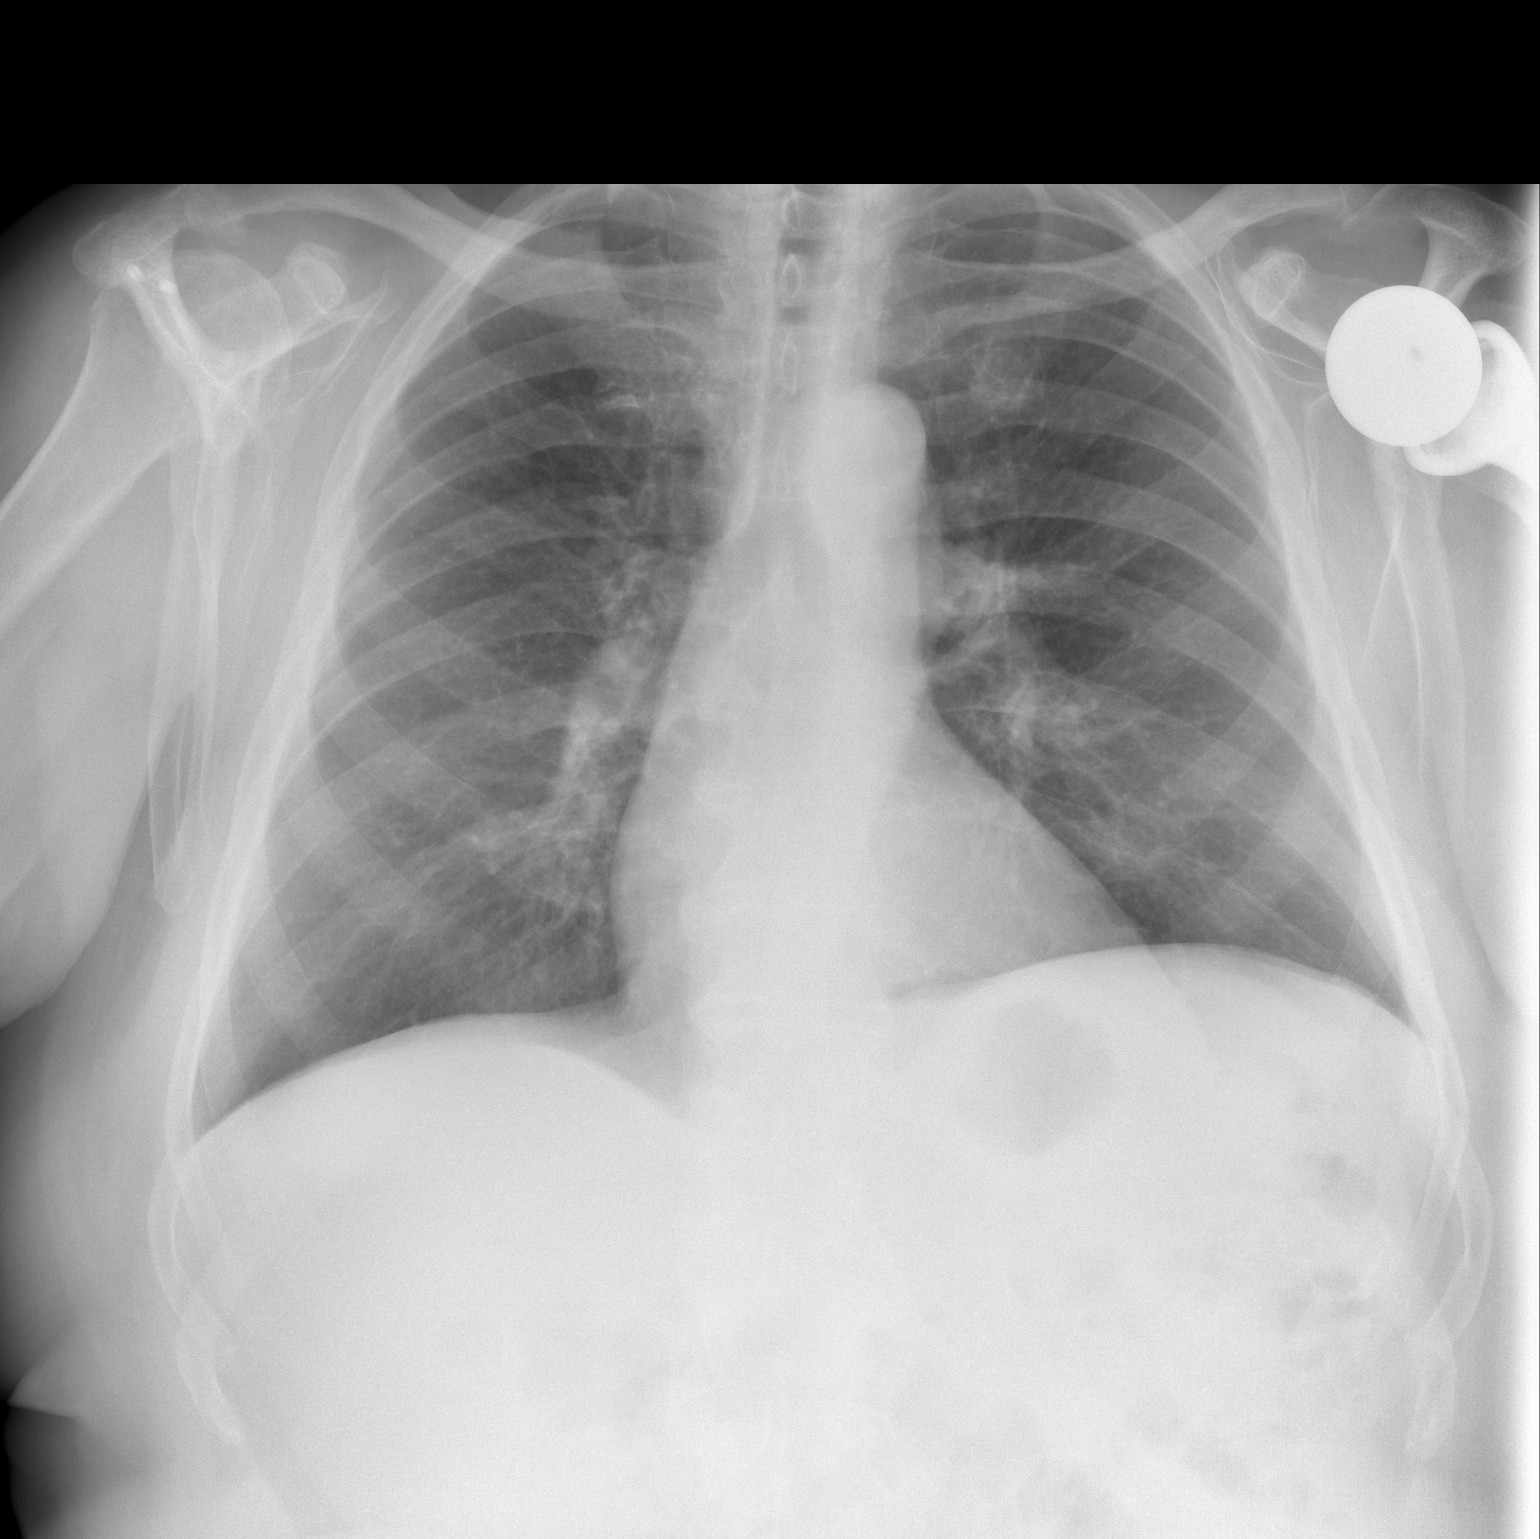

[w chest lat]
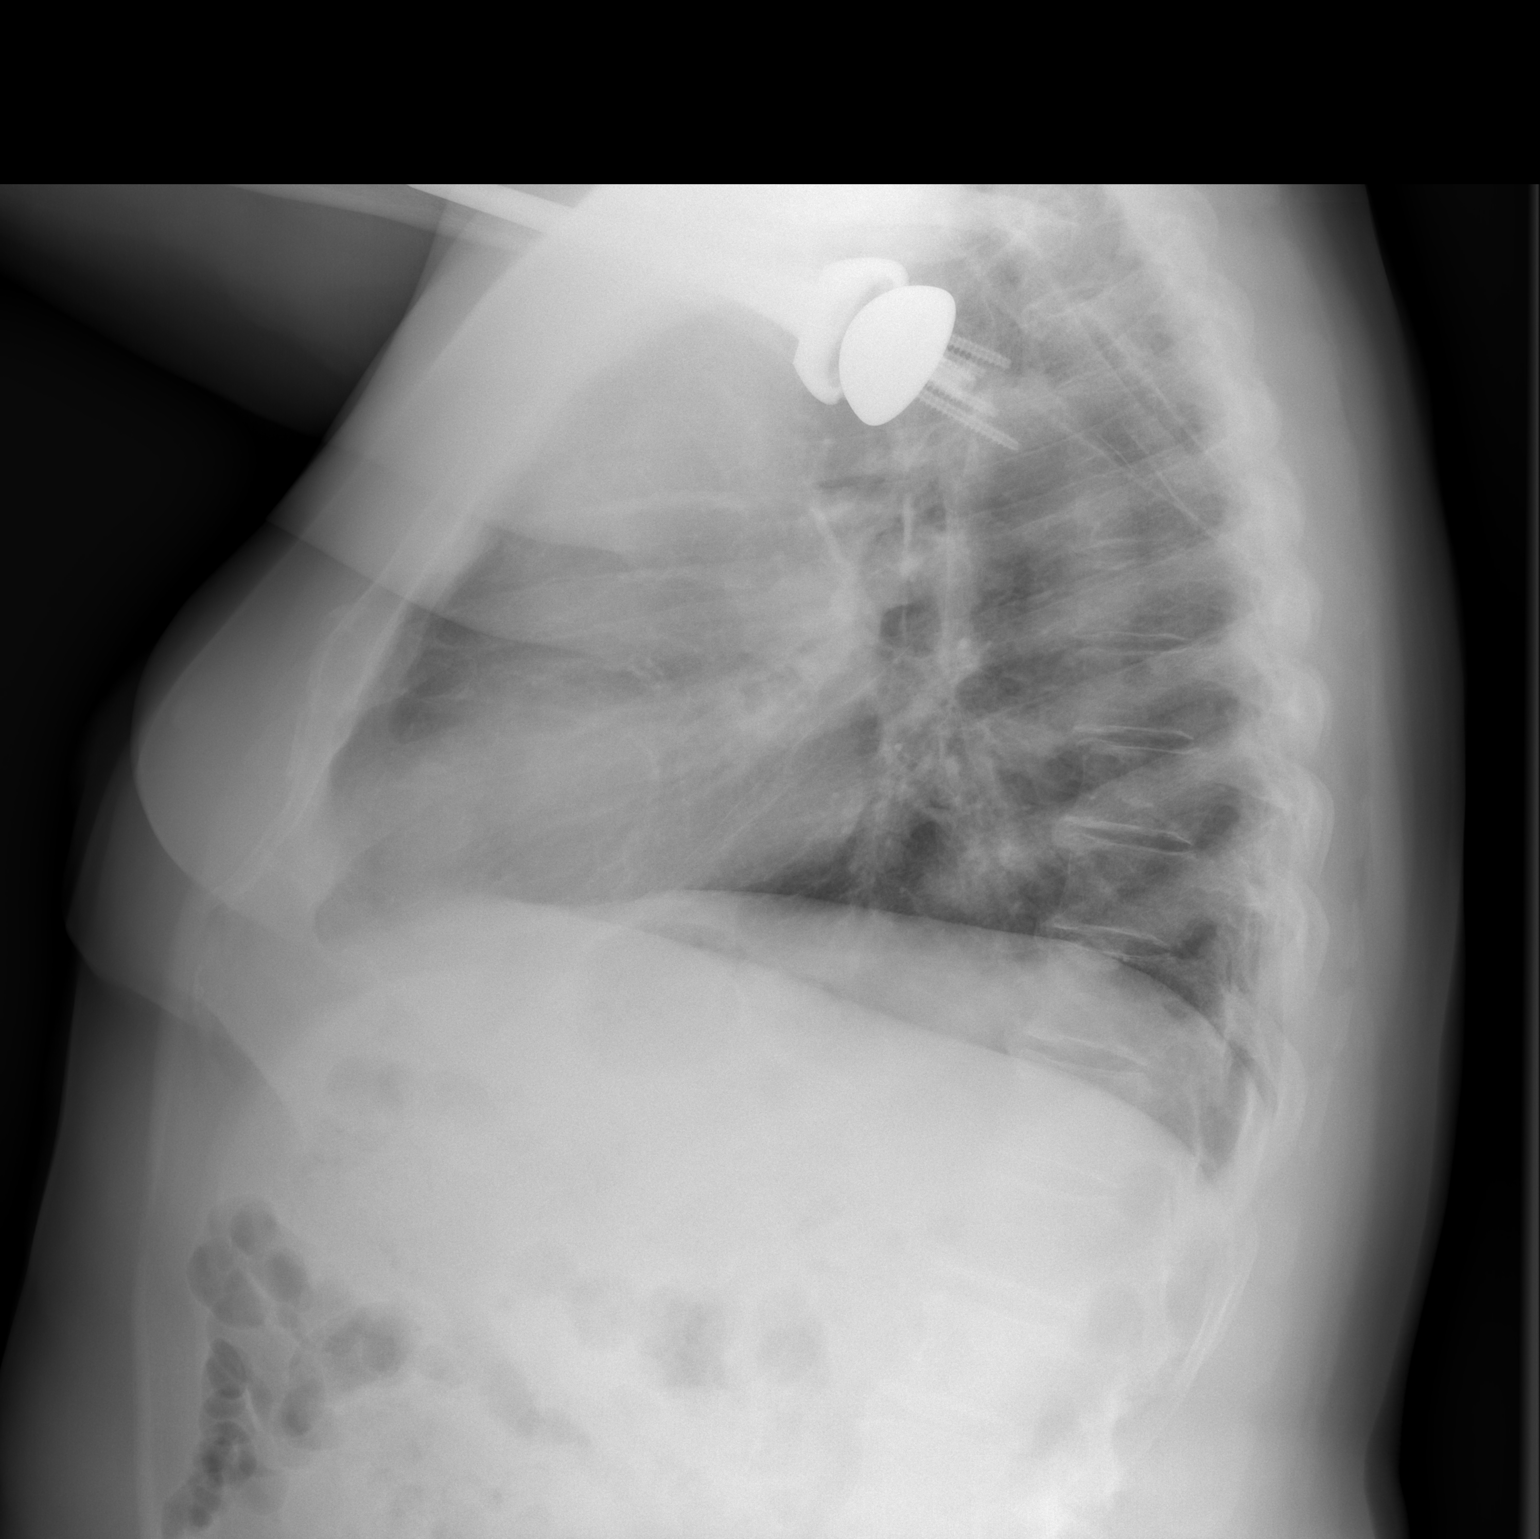

[2 of 2 positions shown; findings below may reference images not displayed]

FINDINGS: Mild elevation of the left hemidiaphragm, similar to the prior
study. Lung volumes are normal. No consolidative airspace disease.
No pleural effusions. No pneumothorax. No pulmonary nodule or mass
noted. Pulmonary vasculature and the cardiomediastinal silhouette
are within normal limits. Status post left shoulder arthroplasty.
IMPRESSION: 1. No radiographic evidence of suggest metastatic disease in the
thorax.
2. No acute cardiopulmonary disease.

## 2019-07-06 DIAGNOSIS — M79642 Pain in left hand: Secondary | ICD-10-CM | POA: Diagnosis not present

## 2019-07-06 DIAGNOSIS — M25562 Pain in left knee: Secondary | ICD-10-CM | POA: Diagnosis not present

## 2019-07-06 DIAGNOSIS — M1712 Unilateral primary osteoarthritis, left knee: Secondary | ICD-10-CM | POA: Diagnosis not present

## 2019-07-06 DIAGNOSIS — M064 Inflammatory polyarthropathy: Secondary | ICD-10-CM | POA: Diagnosis not present

## 2019-07-06 DIAGNOSIS — M19071 Primary osteoarthritis, right ankle and foot: Secondary | ICD-10-CM | POA: Diagnosis not present

## 2019-07-06 DIAGNOSIS — M7989 Other specified soft tissue disorders: Secondary | ICD-10-CM | POA: Diagnosis not present

## 2019-07-06 DIAGNOSIS — M549 Dorsalgia, unspecified: Secondary | ICD-10-CM | POA: Diagnosis not present

## 2019-07-06 DIAGNOSIS — M25561 Pain in right knee: Secondary | ICD-10-CM | POA: Diagnosis not present

## 2019-07-06 DIAGNOSIS — M19041 Primary osteoarthritis, right hand: Secondary | ICD-10-CM | POA: Diagnosis not present

## 2019-07-06 DIAGNOSIS — M79672 Pain in left foot: Secondary | ICD-10-CM | POA: Diagnosis not present

## 2019-07-06 DIAGNOSIS — M79643 Pain in unspecified hand: Secondary | ICD-10-CM | POA: Diagnosis not present

## 2019-07-06 DIAGNOSIS — M25461 Effusion, right knee: Secondary | ICD-10-CM | POA: Diagnosis not present

## 2019-07-06 DIAGNOSIS — M79641 Pain in right hand: Secondary | ICD-10-CM | POA: Diagnosis not present

## 2019-07-06 DIAGNOSIS — N189 Chronic kidney disease, unspecified: Secondary | ICD-10-CM | POA: Diagnosis not present

## 2019-07-06 DIAGNOSIS — M1990 Unspecified osteoarthritis, unspecified site: Secondary | ICD-10-CM | POA: Diagnosis not present

## 2019-07-06 DIAGNOSIS — M1711 Unilateral primary osteoarthritis, right knee: Secondary | ICD-10-CM | POA: Diagnosis not present

## 2019-07-06 DIAGNOSIS — M19072 Primary osteoarthritis, left ankle and foot: Secondary | ICD-10-CM | POA: Diagnosis not present

## 2019-07-06 DIAGNOSIS — M533 Sacrococcygeal disorders, not elsewhere classified: Secondary | ICD-10-CM | POA: Diagnosis not present

## 2019-07-06 DIAGNOSIS — M79671 Pain in right foot: Secondary | ICD-10-CM | POA: Diagnosis not present

## 2019-07-06 DIAGNOSIS — M19042 Primary osteoarthritis, left hand: Secondary | ICD-10-CM | POA: Diagnosis not present

## 2019-07-06 DIAGNOSIS — M47816 Spondylosis without myelopathy or radiculopathy, lumbar region: Secondary | ICD-10-CM | POA: Diagnosis not present

## 2019-07-06 DIAGNOSIS — R21 Rash and other nonspecific skin eruption: Secondary | ICD-10-CM | POA: Diagnosis not present

## 2019-07-22 DIAGNOSIS — M25561 Pain in right knee: Secondary | ICD-10-CM | POA: Diagnosis not present

## 2019-07-22 DIAGNOSIS — M064 Inflammatory polyarthropathy: Secondary | ICD-10-CM | POA: Diagnosis not present

## 2019-07-22 DIAGNOSIS — M549 Dorsalgia, unspecified: Secondary | ICD-10-CM | POA: Diagnosis not present

## 2019-07-22 DIAGNOSIS — N189 Chronic kidney disease, unspecified: Secondary | ICD-10-CM | POA: Diagnosis not present

## 2019-07-22 DIAGNOSIS — M109 Gout, unspecified: Secondary | ICD-10-CM | POA: Diagnosis not present

## 2019-07-22 DIAGNOSIS — M7989 Other specified soft tissue disorders: Secondary | ICD-10-CM | POA: Diagnosis not present

## 2019-07-22 DIAGNOSIS — M25461 Effusion, right knee: Secondary | ICD-10-CM | POA: Diagnosis not present

## 2019-07-22 DIAGNOSIS — R700 Elevated erythrocyte sedimentation rate: Secondary | ICD-10-CM | POA: Diagnosis not present

## 2019-07-22 DIAGNOSIS — M1990 Unspecified osteoarthritis, unspecified site: Secondary | ICD-10-CM | POA: Diagnosis not present

## 2019-07-22 DIAGNOSIS — M79643 Pain in unspecified hand: Secondary | ICD-10-CM | POA: Diagnosis not present

## 2019-07-22 DIAGNOSIS — R21 Rash and other nonspecific skin eruption: Secondary | ICD-10-CM | POA: Diagnosis not present

## 2019-08-28 DIAGNOSIS — R7309 Other abnormal glucose: Secondary | ICD-10-CM | POA: Diagnosis not present

## 2019-08-28 DIAGNOSIS — N183 Chronic kidney disease, stage 3 unspecified: Secondary | ICD-10-CM | POA: Diagnosis not present

## 2019-08-28 DIAGNOSIS — Z0000 Encounter for general adult medical examination without abnormal findings: Secondary | ICD-10-CM | POA: Diagnosis not present

## 2019-08-28 DIAGNOSIS — D649 Anemia, unspecified: Secondary | ICD-10-CM | POA: Diagnosis not present

## 2019-08-28 DIAGNOSIS — D519 Vitamin B12 deficiency anemia, unspecified: Secondary | ICD-10-CM | POA: Diagnosis not present

## 2019-09-14 DIAGNOSIS — F419 Anxiety disorder, unspecified: Secondary | ICD-10-CM | POA: Diagnosis not present

## 2019-09-14 DIAGNOSIS — R739 Hyperglycemia, unspecified: Secondary | ICD-10-CM | POA: Diagnosis not present

## 2019-09-14 DIAGNOSIS — I10 Essential (primary) hypertension: Secondary | ICD-10-CM | POA: Diagnosis not present

## 2019-09-14 DIAGNOSIS — E7800 Pure hypercholesterolemia, unspecified: Secondary | ICD-10-CM | POA: Diagnosis not present

## 2019-09-14 DIAGNOSIS — Z0000 Encounter for general adult medical examination without abnormal findings: Secondary | ICD-10-CM | POA: Diagnosis not present

## 2019-09-14 DIAGNOSIS — D649 Anemia, unspecified: Secondary | ICD-10-CM | POA: Diagnosis not present

## 2019-09-14 DIAGNOSIS — K5790 Diverticulosis of intestine, part unspecified, without perforation or abscess without bleeding: Secondary | ICD-10-CM | POA: Diagnosis not present

## 2019-09-14 DIAGNOSIS — M129 Arthropathy, unspecified: Secondary | ICD-10-CM | POA: Diagnosis not present

## 2019-09-14 DIAGNOSIS — G4700 Insomnia, unspecified: Secondary | ICD-10-CM | POA: Diagnosis not present

## 2019-09-14 DIAGNOSIS — M539 Dorsopathy, unspecified: Secondary | ICD-10-CM | POA: Diagnosis not present

## 2019-09-14 DIAGNOSIS — C679 Malignant neoplasm of bladder, unspecified: Secondary | ICD-10-CM | POA: Diagnosis not present

## 2019-09-14 DIAGNOSIS — M1990 Unspecified osteoarthritis, unspecified site: Secondary | ICD-10-CM | POA: Diagnosis not present

## 2019-10-07 DIAGNOSIS — Z936 Other artificial openings of urinary tract status: Secondary | ICD-10-CM | POA: Diagnosis not present

## 2019-10-08 DIAGNOSIS — M064 Inflammatory polyarthropathy: Secondary | ICD-10-CM | POA: Diagnosis not present

## 2019-10-08 DIAGNOSIS — M25561 Pain in right knee: Secondary | ICD-10-CM | POA: Diagnosis not present

## 2019-10-08 DIAGNOSIS — M7989 Other specified soft tissue disorders: Secondary | ICD-10-CM | POA: Diagnosis not present

## 2019-10-08 DIAGNOSIS — M109 Gout, unspecified: Secondary | ICD-10-CM | POA: Diagnosis not present

## 2019-10-08 DIAGNOSIS — R700 Elevated erythrocyte sedimentation rate: Secondary | ICD-10-CM | POA: Diagnosis not present

## 2019-10-08 DIAGNOSIS — M25461 Effusion, right knee: Secondary | ICD-10-CM | POA: Diagnosis not present

## 2019-10-08 DIAGNOSIS — L4050 Arthropathic psoriasis, unspecified: Secondary | ICD-10-CM | POA: Diagnosis not present

## 2019-10-08 DIAGNOSIS — M79643 Pain in unspecified hand: Secondary | ICD-10-CM | POA: Diagnosis not present

## 2019-10-08 DIAGNOSIS — M549 Dorsalgia, unspecified: Secondary | ICD-10-CM | POA: Diagnosis not present

## 2019-10-08 DIAGNOSIS — N189 Chronic kidney disease, unspecified: Secondary | ICD-10-CM | POA: Diagnosis not present

## 2019-10-08 DIAGNOSIS — R21 Rash and other nonspecific skin eruption: Secondary | ICD-10-CM | POA: Diagnosis not present

## 2019-10-08 DIAGNOSIS — M1990 Unspecified osteoarthritis, unspecified site: Secondary | ICD-10-CM | POA: Diagnosis not present

## 2019-12-04 DIAGNOSIS — R21 Rash and other nonspecific skin eruption: Secondary | ICD-10-CM | POA: Diagnosis not present

## 2019-12-04 DIAGNOSIS — M1990 Unspecified osteoarthritis, unspecified site: Secondary | ICD-10-CM | POA: Diagnosis not present

## 2019-12-04 DIAGNOSIS — M7989 Other specified soft tissue disorders: Secondary | ICD-10-CM | POA: Diagnosis not present

## 2019-12-04 DIAGNOSIS — M25461 Effusion, right knee: Secondary | ICD-10-CM | POA: Diagnosis not present

## 2019-12-04 DIAGNOSIS — R700 Elevated erythrocyte sedimentation rate: Secondary | ICD-10-CM | POA: Diagnosis not present

## 2019-12-04 DIAGNOSIS — M79643 Pain in unspecified hand: Secondary | ICD-10-CM | POA: Diagnosis not present

## 2019-12-04 DIAGNOSIS — M549 Dorsalgia, unspecified: Secondary | ICD-10-CM | POA: Diagnosis not present

## 2019-12-04 DIAGNOSIS — M109 Gout, unspecified: Secondary | ICD-10-CM | POA: Diagnosis not present

## 2019-12-04 DIAGNOSIS — N189 Chronic kidney disease, unspecified: Secondary | ICD-10-CM | POA: Diagnosis not present

## 2019-12-04 DIAGNOSIS — M25561 Pain in right knee: Secondary | ICD-10-CM | POA: Diagnosis not present

## 2019-12-04 DIAGNOSIS — M064 Inflammatory polyarthropathy: Secondary | ICD-10-CM | POA: Diagnosis not present

## 2019-12-04 DIAGNOSIS — L4050 Arthropathic psoriasis, unspecified: Secondary | ICD-10-CM | POA: Diagnosis not present

## 2019-12-30 DIAGNOSIS — Z23 Encounter for immunization: Secondary | ICD-10-CM | POA: Diagnosis not present

## 2020-01-01 DIAGNOSIS — C61 Malignant neoplasm of prostate: Secondary | ICD-10-CM | POA: Diagnosis not present

## 2020-01-01 DIAGNOSIS — C670 Malignant neoplasm of trigone of bladder: Secondary | ICD-10-CM | POA: Diagnosis not present

## 2020-01-04 DIAGNOSIS — K5730 Diverticulosis of large intestine without perforation or abscess without bleeding: Secondary | ICD-10-CM | POA: Diagnosis not present

## 2020-01-04 DIAGNOSIS — C679 Malignant neoplasm of bladder, unspecified: Secondary | ICD-10-CM | POA: Diagnosis not present

## 2020-01-04 DIAGNOSIS — K8020 Calculus of gallbladder without cholecystitis without obstruction: Secondary | ICD-10-CM | POA: Diagnosis not present

## 2020-01-04 DIAGNOSIS — K435 Parastomal hernia without obstruction or  gangrene: Secondary | ICD-10-CM | POA: Diagnosis not present

## 2020-01-04 DIAGNOSIS — C670 Malignant neoplasm of trigone of bladder: Secondary | ICD-10-CM | POA: Diagnosis not present

## 2020-01-14 DIAGNOSIS — M064 Inflammatory polyarthropathy: Secondary | ICD-10-CM | POA: Diagnosis not present

## 2020-01-14 DIAGNOSIS — M25561 Pain in right knee: Secondary | ICD-10-CM | POA: Diagnosis not present

## 2020-01-14 DIAGNOSIS — R21 Rash and other nonspecific skin eruption: Secondary | ICD-10-CM | POA: Diagnosis not present

## 2020-01-14 DIAGNOSIS — N189 Chronic kidney disease, unspecified: Secondary | ICD-10-CM | POA: Diagnosis not present

## 2020-01-14 DIAGNOSIS — L4050 Arthropathic psoriasis, unspecified: Secondary | ICD-10-CM | POA: Diagnosis not present

## 2020-01-14 DIAGNOSIS — M109 Gout, unspecified: Secondary | ICD-10-CM | POA: Diagnosis not present

## 2020-01-14 DIAGNOSIS — M1990 Unspecified osteoarthritis, unspecified site: Secondary | ICD-10-CM | POA: Diagnosis not present

## 2020-01-14 DIAGNOSIS — R700 Elevated erythrocyte sedimentation rate: Secondary | ICD-10-CM | POA: Diagnosis not present

## 2020-01-14 DIAGNOSIS — M25461 Effusion, right knee: Secondary | ICD-10-CM | POA: Diagnosis not present

## 2020-01-14 DIAGNOSIS — M549 Dorsalgia, unspecified: Secondary | ICD-10-CM | POA: Diagnosis not present

## 2020-01-14 DIAGNOSIS — M7989 Other specified soft tissue disorders: Secondary | ICD-10-CM | POA: Diagnosis not present

## 2020-01-14 DIAGNOSIS — M79643 Pain in unspecified hand: Secondary | ICD-10-CM | POA: Diagnosis not present

## 2020-01-18 DIAGNOSIS — Z936 Other artificial openings of urinary tract status: Secondary | ICD-10-CM | POA: Diagnosis not present

## 2020-01-18 DIAGNOSIS — K435 Parastomal hernia without obstruction or  gangrene: Secondary | ICD-10-CM | POA: Diagnosis not present

## 2020-01-18 DIAGNOSIS — C61 Malignant neoplasm of prostate: Secondary | ICD-10-CM | POA: Diagnosis not present

## 2020-01-18 DIAGNOSIS — C670 Malignant neoplasm of trigone of bladder: Secondary | ICD-10-CM | POA: Diagnosis not present

## 2020-02-01 DIAGNOSIS — Z436 Encounter for attention to other artificial openings of urinary tract: Secondary | ICD-10-CM | POA: Diagnosis not present

## 2020-03-09 DIAGNOSIS — R7309 Other abnormal glucose: Secondary | ICD-10-CM | POA: Diagnosis not present

## 2020-03-09 DIAGNOSIS — E7800 Pure hypercholesterolemia, unspecified: Secondary | ICD-10-CM | POA: Diagnosis not present

## 2020-03-09 DIAGNOSIS — M109 Gout, unspecified: Secondary | ICD-10-CM | POA: Diagnosis not present

## 2020-03-09 DIAGNOSIS — I10 Essential (primary) hypertension: Secondary | ICD-10-CM | POA: Diagnosis not present

## 2020-03-16 DIAGNOSIS — F419 Anxiety disorder, unspecified: Secondary | ICD-10-CM | POA: Diagnosis not present

## 2020-03-16 DIAGNOSIS — I129 Hypertensive chronic kidney disease with stage 1 through stage 4 chronic kidney disease, or unspecified chronic kidney disease: Secondary | ICD-10-CM | POA: Diagnosis not present

## 2020-03-16 DIAGNOSIS — M064 Inflammatory polyarthropathy: Secondary | ICD-10-CM | POA: Diagnosis not present

## 2020-03-16 DIAGNOSIS — R7309 Other abnormal glucose: Secondary | ICD-10-CM | POA: Diagnosis not present

## 2020-03-16 DIAGNOSIS — Z79899 Other long term (current) drug therapy: Secondary | ICD-10-CM | POA: Diagnosis not present

## 2020-03-16 DIAGNOSIS — M539 Dorsopathy, unspecified: Secondary | ICD-10-CM | POA: Diagnosis not present

## 2020-03-16 DIAGNOSIS — G4700 Insomnia, unspecified: Secondary | ICD-10-CM | POA: Diagnosis not present

## 2020-03-16 DIAGNOSIS — E7800 Pure hypercholesterolemia, unspecified: Secondary | ICD-10-CM | POA: Diagnosis not present

## 2020-03-17 DIAGNOSIS — R700 Elevated erythrocyte sedimentation rate: Secondary | ICD-10-CM | POA: Diagnosis not present

## 2020-03-17 DIAGNOSIS — M7989 Other specified soft tissue disorders: Secondary | ICD-10-CM | POA: Diagnosis not present

## 2020-03-17 DIAGNOSIS — L4050 Arthropathic psoriasis, unspecified: Secondary | ICD-10-CM | POA: Diagnosis not present

## 2020-03-17 DIAGNOSIS — M109 Gout, unspecified: Secondary | ICD-10-CM | POA: Diagnosis not present

## 2020-03-17 DIAGNOSIS — M25561 Pain in right knee: Secondary | ICD-10-CM | POA: Diagnosis not present

## 2020-03-17 DIAGNOSIS — M25461 Effusion, right knee: Secondary | ICD-10-CM | POA: Diagnosis not present

## 2020-03-17 DIAGNOSIS — M064 Inflammatory polyarthropathy: Secondary | ICD-10-CM | POA: Diagnosis not present

## 2020-03-17 DIAGNOSIS — R21 Rash and other nonspecific skin eruption: Secondary | ICD-10-CM | POA: Diagnosis not present

## 2020-03-17 DIAGNOSIS — M1990 Unspecified osteoarthritis, unspecified site: Secondary | ICD-10-CM | POA: Diagnosis not present

## 2020-03-17 DIAGNOSIS — N189 Chronic kidney disease, unspecified: Secondary | ICD-10-CM | POA: Diagnosis not present

## 2020-03-17 DIAGNOSIS — M549 Dorsalgia, unspecified: Secondary | ICD-10-CM | POA: Diagnosis not present

## 2020-03-17 DIAGNOSIS — M79643 Pain in unspecified hand: Secondary | ICD-10-CM | POA: Diagnosis not present

## 2020-06-16 DIAGNOSIS — Z79899 Other long term (current) drug therapy: Secondary | ICD-10-CM | POA: Diagnosis not present

## 2020-06-16 DIAGNOSIS — M109 Gout, unspecified: Secondary | ICD-10-CM | POA: Diagnosis not present

## 2020-06-16 DIAGNOSIS — M25561 Pain in right knee: Secondary | ICD-10-CM | POA: Diagnosis not present

## 2020-06-16 DIAGNOSIS — M79643 Pain in unspecified hand: Secondary | ICD-10-CM | POA: Diagnosis not present

## 2020-06-16 DIAGNOSIS — L4050 Arthropathic psoriasis, unspecified: Secondary | ICD-10-CM | POA: Diagnosis not present

## 2020-06-16 DIAGNOSIS — M7989 Other specified soft tissue disorders: Secondary | ICD-10-CM | POA: Diagnosis not present

## 2020-06-16 DIAGNOSIS — R21 Rash and other nonspecific skin eruption: Secondary | ICD-10-CM | POA: Diagnosis not present

## 2020-06-16 DIAGNOSIS — N1832 Chronic kidney disease, stage 3b: Secondary | ICD-10-CM | POA: Diagnosis not present

## 2020-06-16 DIAGNOSIS — M1990 Unspecified osteoarthritis, unspecified site: Secondary | ICD-10-CM | POA: Diagnosis not present

## 2020-06-16 DIAGNOSIS — M25461 Effusion, right knee: Secondary | ICD-10-CM | POA: Diagnosis not present

## 2020-06-22 DIAGNOSIS — Z436 Encounter for attention to other artificial openings of urinary tract: Secondary | ICD-10-CM | POA: Diagnosis not present

## 2020-09-08 DIAGNOSIS — Z79899 Other long term (current) drug therapy: Secondary | ICD-10-CM | POA: Diagnosis not present

## 2020-09-08 DIAGNOSIS — I129 Hypertensive chronic kidney disease with stage 1 through stage 4 chronic kidney disease, or unspecified chronic kidney disease: Secondary | ICD-10-CM | POA: Diagnosis not present

## 2020-09-08 DIAGNOSIS — R7309 Other abnormal glucose: Secondary | ICD-10-CM | POA: Diagnosis not present

## 2020-09-08 DIAGNOSIS — E7800 Pure hypercholesterolemia, unspecified: Secondary | ICD-10-CM | POA: Diagnosis not present

## 2020-09-15 DIAGNOSIS — Z0000 Encounter for general adult medical examination without abnormal findings: Secondary | ICD-10-CM | POA: Diagnosis not present

## 2020-09-15 DIAGNOSIS — D649 Anemia, unspecified: Secondary | ICD-10-CM | POA: Diagnosis not present

## 2020-09-15 DIAGNOSIS — Z23 Encounter for immunization: Secondary | ICD-10-CM | POA: Diagnosis not present

## 2020-09-15 DIAGNOSIS — I129 Hypertensive chronic kidney disease with stage 1 through stage 4 chronic kidney disease, or unspecified chronic kidney disease: Secondary | ICD-10-CM | POA: Diagnosis not present

## 2020-09-15 DIAGNOSIS — G4700 Insomnia, unspecified: Secondary | ICD-10-CM | POA: Diagnosis not present

## 2020-09-15 DIAGNOSIS — N1831 Chronic kidney disease, stage 3a: Secondary | ICD-10-CM | POA: Diagnosis not present

## 2020-09-15 DIAGNOSIS — M539 Dorsopathy, unspecified: Secondary | ICD-10-CM | POA: Diagnosis not present

## 2020-09-16 DIAGNOSIS — M1990 Unspecified osteoarthritis, unspecified site: Secondary | ICD-10-CM | POA: Diagnosis not present

## 2020-09-16 DIAGNOSIS — M79643 Pain in unspecified hand: Secondary | ICD-10-CM | POA: Diagnosis not present

## 2020-09-16 DIAGNOSIS — Z79899 Other long term (current) drug therapy: Secondary | ICD-10-CM | POA: Diagnosis not present

## 2020-09-16 DIAGNOSIS — M25461 Effusion, right knee: Secondary | ICD-10-CM | POA: Diagnosis not present

## 2020-09-16 DIAGNOSIS — N1832 Chronic kidney disease, stage 3b: Secondary | ICD-10-CM | POA: Diagnosis not present

## 2020-09-16 DIAGNOSIS — M7500 Adhesive capsulitis of unspecified shoulder: Secondary | ICD-10-CM | POA: Diagnosis not present

## 2020-09-16 DIAGNOSIS — M7989 Other specified soft tissue disorders: Secondary | ICD-10-CM | POA: Diagnosis not present

## 2020-09-16 DIAGNOSIS — M25511 Pain in right shoulder: Secondary | ICD-10-CM | POA: Diagnosis not present

## 2020-09-16 DIAGNOSIS — R21 Rash and other nonspecific skin eruption: Secondary | ICD-10-CM | POA: Diagnosis not present

## 2020-09-16 DIAGNOSIS — M109 Gout, unspecified: Secondary | ICD-10-CM | POA: Diagnosis not present

## 2020-09-16 DIAGNOSIS — L4050 Arthropathic psoriasis, unspecified: Secondary | ICD-10-CM | POA: Diagnosis not present

## 2020-09-16 DIAGNOSIS — M25561 Pain in right knee: Secondary | ICD-10-CM | POA: Diagnosis not present

## 2020-09-24 DIAGNOSIS — Z436 Encounter for attention to other artificial openings of urinary tract: Secondary | ICD-10-CM | POA: Diagnosis not present

## 2020-10-12 DIAGNOSIS — M1990 Unspecified osteoarthritis, unspecified site: Secondary | ICD-10-CM | POA: Diagnosis not present

## 2020-10-12 DIAGNOSIS — N1831 Chronic kidney disease, stage 3a: Secondary | ICD-10-CM | POA: Diagnosis not present

## 2020-10-12 DIAGNOSIS — I129 Hypertensive chronic kidney disease with stage 1 through stage 4 chronic kidney disease, or unspecified chronic kidney disease: Secondary | ICD-10-CM | POA: Diagnosis not present

## 2020-10-12 DIAGNOSIS — E7800 Pure hypercholesterolemia, unspecified: Secondary | ICD-10-CM | POA: Diagnosis not present

## 2020-10-27 DIAGNOSIS — I10 Essential (primary) hypertension: Secondary | ICD-10-CM | POA: Diagnosis not present

## 2020-10-27 DIAGNOSIS — R42 Dizziness and giddiness: Secondary | ICD-10-CM | POA: Diagnosis not present

## 2020-10-27 DIAGNOSIS — R7309 Other abnormal glucose: Secondary | ICD-10-CM | POA: Diagnosis not present

## 2020-12-09 DIAGNOSIS — Z23 Encounter for immunization: Secondary | ICD-10-CM | POA: Diagnosis not present

## 2021-01-24 DIAGNOSIS — Z436 Encounter for attention to other artificial openings of urinary tract: Secondary | ICD-10-CM | POA: Diagnosis not present

## 2021-02-07 DIAGNOSIS — C670 Malignant neoplasm of trigone of bladder: Secondary | ICD-10-CM | POA: Diagnosis not present

## 2021-02-07 DIAGNOSIS — C61 Malignant neoplasm of prostate: Secondary | ICD-10-CM | POA: Diagnosis not present

## 2021-02-14 DIAGNOSIS — C670 Malignant neoplasm of trigone of bladder: Secondary | ICD-10-CM | POA: Diagnosis not present

## 2021-02-16 ENCOUNTER — Other Ambulatory Visit (HOSPITAL_COMMUNITY): Payer: Self-pay | Admitting: Urology

## 2021-02-16 ENCOUNTER — Ambulatory Visit (HOSPITAL_COMMUNITY)
Admission: RE | Admit: 2021-02-16 | Discharge: 2021-02-16 | Disposition: A | Discharge status: Home / Self Care | Payer: PPO | Source: Ambulatory Visit | Attending: Urology | Admitting: Urology

## 2021-02-16 ENCOUNTER — Other Ambulatory Visit: Payer: Self-pay

## 2021-02-16 DIAGNOSIS — C670 Malignant neoplasm of trigone of bladder: Secondary | ICD-10-CM | POA: Diagnosis not present

## 2021-11-18 ENCOUNTER — Ambulatory Visit: Payer: Self-pay | Admitting: Physician Assistant

## 2021-11-18 DIAGNOSIS — G8929 Other chronic pain: Secondary | ICD-10-CM

## 2021-11-18 NOTE — H&P (Signed)
TOTAL KNEE ADMISSION H&P  Patient is being admitted for right total knee arthroplasty.  Subjective:  Chief Complaint:right knee pain.  HPI: Jeremiah Wright, 81 y.o. male, has a history of pain and functional disability in the right knee due to arthritis and has failed non-surgical conservative treatments for greater than 12 weeks to includeNSAID's and/or analgesics, corticosteriod injections, use of assistive devices, and activity modification.  Onset of symptoms was gradual, starting >10 years ago with gradually worsening course since that time. The patient noted  foreign body removal   on the right knee(s).  Patient currently rates pain in the right knee(s) at 8 out of 10 with activity. Patient has night pain, worsening of pain with activity and weight bearing, pain that interferes with activities of daily living, pain with passive range of motion, crepitus, and joint swelling.  Patient has evidence of periarticular osteophytes and joint space narrowing by imaging studies. There is no active infection.  Patient Active Problem List   Diagnosis Date Noted   S/P total knee arthroplasty, left 01/13/2018   Degenerative arthritis of left knee 01/08/2018   Bladder cancer ([place]) 09/14/2013   Arthritis of shoulder region, left 11/30/2011   Past Medical History:  Diagnosis Date   Arthritis    SHOULDERS AND KNEES - ARTHRITIS AND PAIN   Bladder cancer ([place])    "[name]"   prostate   GERD (gastroesophageal reflux disease)    under control   Heart murmur    PT TOLD SLIGHT HEART MURMUR - DOESN'T CAUSE ANY PROBLEMS   History of diverticulitis of colon    Hypertension    Insomnia     Past Surgical History:  Procedure Laterality Date   CARDIOVASCULAR STRESS TEST  10-14-2008  DR BERRY   NORMAL PERFUSION STUDY/ NO ISCHEMIA/ EF 61%   CYSTOSCOPY N/A 03/02/2013   Procedure: CYSTOSCOPY WITH INSTILLATION OF MITOMYCIN C;  Surgeon: Ailene Rud, Wright;  Location: [facility];   Service: Urology;  Laterality: N/A;   CYSTOSCOPY N/A 12/30/2013   Procedure: CYSTOSCOPY WITH INDOCYANINE GREEN DYE INJECTION;  Surgeon: Alexis Frock, Wright;  Location: [facility];  Service: Urology;  Laterality: N/A;   CYSTOSCOPY W/ RETROGRADES Right 04/17/2012   Procedure: CYSTOSCOPY WITH RETROGRADE PYELOGRAM;  Surgeon: Ailene Rud, Wright;  Location: [facility];  Service: Urology;  Laterality: Right;   CYSTOSCOPY WITH BIOPSY Right 04/17/2012   Procedure: CYSTOSCOPY WITH  BIOPSY OF RIGHT BLADDER DIVERTICULUM TUMOR BUTTON VAPORIZATION OF TUMOR INSIDE DIVERTICULUM;  Surgeon: Ailene Rud, Wright;  Location: [facility];  Service: Urology;  Laterality: Right;  CYSTOSCOPY WITH COLD CUP BIOPSY OF RIGHT BLADDER DIVERTICULUM TUMOR/HOLMIUM LASER OF BLADDER TUMOR/POSSIBLE BUTTON VAPORIZATION OF TUMOR INSIDE DIVERTICULUM   CYSTOSCOPY WITH BIOPSY N/A 11/07/2012   Procedure: CYSTOSCOPY WITH COLD CUP BIOPSY AND FULGERATION;  Surgeon: Ailene Rud, Wright;  Location: [facility];  Service: Urology;  Laterality: N/A;   CYSTOSCOPY WITH RETROGRADE PYELOGRAM, URETEROSCOPY AND STENT PLACEMENT Bilateral 09/14/2013   Procedure: CYSTO BILATERAL RETROGRADE PYELOGRAM;  Surgeon: Ailene Rud, Wright;  Location: [facility];  Service: Urology;  Laterality: Bilateral;   FATTY TUMOR     REMOVED FROM BACK   JOINT REPLACEMENT     Left total knee arthroplasty Jeremiah Wright  01-13-18   KNEE ARTHROSCOPY Bilateral >10 YRS AGO   OPEN ACROMIONECTOMY/ RESECTION DISTAL CLAVICLE/ REPAIR ROTATOR CUFF  03-15-2009   LEFT SHOULDER   REVERSE SHOULDER ARTHROPLASTY  11/29/2011   Procedure: REVERSE  SHOULDER ARTHROPLASTY;  Surgeon: Jeremiah Wright;  Location: [facility];  Service: Orthopedics;  Laterality: Left;  Left Reverse Total Shoulder Arthroplasty with left supra clavicular block   ROBOT ASSISTED LAPAROSCOPIC COMPLETE CYSTECT ILEAL CONDUIT N/A 12/30/2013   Procedure: XI ROBOTIC ASSISTED  LAPAROSCOPIC COMPLETE CYSTECTOMY, ILEAL CONDUIT;  Surgeon: Alexis Frock, Wright;  Location: [facility];  Service: Urology;  Laterality: N/A;   SECONDARY CLOSURE ARM     RT ARM INJURY   SHOULDER OPEN ROTATOR CUFF REPAIR  07-18-2001   AND PARTIAL ACROMIONECTOMY/ ACROMIOPLASTY ,  RIGHT SHOULDER   TONSILLECTOMY  as child   TOTAL KNEE ARTHROPLASTY Left 01/13/2018   Procedure: TOTAL KNEE ARTHROPLASTY;  Surgeon: Jeremiah Wright;  Location[facility];  Service: Orthopedics;  Laterality: Left;   TRANSTHORACIC ECHOCARDIOGRAM  09-07-2008   DR BERRY   LVEF 55-65%/ BORDERLINE LV HYPERTROPHY   TRANSURETHRAL RESECTION OF BLADDER TUMOR N/A 03/02/2013   Procedure: TRANSURETHRAL RESECTION OF BLADDER TUMOR (TURBT);  Surgeon: Ailene Rud, Wright;  Location: [facility];  Service: Urology;  Laterality: N/A;   TRANSURETHRAL RESECTION OF BLADDER TUMOR N/A 09/14/2013   Procedure: TRANSURETHRAL RESECTION OF BLADDER TUMOR (TURBT)/TURP WITH GYRUS;  Surgeon: Ailene Rud, Wright;  [facility];  Service: Urology;  Laterality: N/A;   TRANSURETHRAL RESECTION OF BLADDER TUMOR WITH MITOMYCIN-C N/A 11/07/2012   Procedure: TRANSURETHRAL RESECTION OF BLADDER TUMOR WITH MITOMYCIN-C;  Surgeon: Ailene Rud, Wright;  Location: [facility];  Service: Urology;  Laterality: N/A;    Current Outpatient Medications  Medication Sig Dispense Refill Last Dose   aspirin EC 81 MG tablet Take 1 tablet (81 mg total) by mouth 2 (two) times daily. 60 tablet 0    carvedilol (COREG) 12.5 MG tablet Take 12.5 mg by mouth 2 (two) times daily with a meal.  11    hydrocortisone cream 1 % Apply 1 application topically 2 (two) times daily as needed for itching.      LORazepam (ATIVAN) 0.5 MG tablet Take 0.5 mg by mouth at bedtime as needed for anxiety.   2    oxyCODONE-acetaminophen (PERCOCET/ROXICET) 5-325 MG tablet Take 1 tablet by mouth every 4 (four) hours as needed for severe pain. 30 tablet 0    tiZANidine  (ZANAFLEX) 2 MG tablet Take 1 tablet (2 mg total) by mouth every 6 (six) hours as needed. 60 tablet 0    valsartan (DIOVAN) 40 MG tablet Take 20 mg by mouth 2 (two) times daily.      zolpidem (AMBIEN) 10 MG tablet Take 10 mg by mouth at bedtime as needed for sleep.       No current facility-administered medications for this visit.   No Known Allergies  Social History   Tobacco Use   Smoking status: Former    Types: Cigars    Quit date: 11/20/2003    Years since quitting: 18.0   Smokeless tobacco: Never  Substance Use Topics   Alcohol use: No    No family history on file.   Review of Systems  HENT:  Positive for hearing loss.   Respiratory:  Positive for cough.   Cardiovascular:  Positive for leg swelling.  Gastrointestinal:  Positive for nausea and vomiting.  Musculoskeletal:  Positive for joint swelling.  All other systems reviewed and are negative.   Objective:  Physical Exam Constitutional:      General: He is not in acute distress.    Appearance: Normal appearance.  HENT:  Head: Normocephalic and atraumatic.  Eyes:     Extraocular Movements: Extraocular movements intact.     Pupils: Pupils are equal, round, and reactive to light.  Cardiovascular:     Rate and Rhythm: Normal rate and regular rhythm.     Pulses: Normal pulses.     Heart sounds: Normal heart sounds.  Pulmonary:     Effort: Pulmonary effort is normal.     Breath sounds: Normal breath sounds. No wheezing or rales.  Abdominal:     General: Abdomen is flat. Bowel sounds are normal. There is no distension.     Palpations: Abdomen is soft.     Tenderness: There is no abdominal tenderness.  Musculoskeletal:     Cervical back: Normal range of motion and neck supple.     Right knee: Swelling and bony tenderness present. No effusion or erythema. Tenderness present.     Comments: Previous ant/med knee incision  Lymphadenopathy:     Cervical: No cervical adenopathy.  Skin:    General: Skin is warm and  dry.     Findings: No erythema or rash.  Neurological:     General: No focal deficit present.     Mental Status: He is alert and oriented to person, place, and time.  Psychiatric:        Mood and Affect: Mood normal.        Behavior: Behavior normal.     Vital signs in last 24 hours: '@VSRANGES'$ @  Labs:   Estimated body mass index is 33.73 kg/m as calculated from the following:   Height as of 01/13/18: '5\' 6"'$  (1.676 m).   Weight as of 01/13/18: 94.8 kg.   Imaging Review Plain radiographs demonstrate moderate degenerative joint disease of the right knee(s). The overall alignment ismild varus. The bone quality appears to be good for age and reported activity level.      Assessment/Plan:  End stage arthritis, right knee   The patient history, physical examination, clinical judgment of the provider and imaging studies are consistent with end stage degenerative joint disease of the right knee(s) and total knee arthroplasty is deemed medically necessary. The treatment options including medical management, injection therapy arthroscopy and arthroplasty were discussed at length. The risks and benefits of total knee arthroplasty were presented and reviewed. The risks due to aseptic loosening, infection, stiffness, patella tracking problems, thromboembolic complications and other imponderables were discussed. The patient acknowledged the explanation, agreed to proceed with the plan and consent was signed. Patient is being admitted for inpatient treatment for surgery, pain control, PT, OT, prophylactic antibiotics, VTE prophylaxis, progressive ambulation and ADL's and discharge planning. The patient is planning to be discharged home with home health services    Anticipated LOS equal to or greater than 2 midnights due to - Age 72 and older with one or more of the following:  - Obesity  - Expected need for hospital services (PT, OT, Nursing) required for safe  discharge  - Anticipated need  for postoperative skilled nursing care or inpatient rehab  - Active co-morbidities: None OR   - Unanticipated findings during/Post Surgery: None  - Patient is a high risk of re-admission due to: None

## 2021-11-18 NOTE — H&P (View-Only) (Signed)
TOTAL KNEE ADMISSION H&P  Patient is being admitted for right total knee arthroplasty.  Subjective:  Chief Complaint:right knee pain.  HPI: Jeremiah Wright, 81 y.o. male, has a history of pain and functional disability in the right knee due to arthritis and has failed non-surgical conservative treatments for greater than 12 weeks to includeNSAID's and/or analgesics, corticosteriod injections, use of assistive devices, and activity modification.  Onset of symptoms was gradual, starting >10 years ago with gradually worsening course since that time. The patient noted  foreign body removal   on the right knee(s).  Patient currently rates pain in the right knee(s) at 8 out of 10 with activity. Patient has night pain, worsening of pain with activity and weight bearing, pain that interferes with activities of daily living, pain with passive range of motion, crepitus, and joint swelling.  Patient has evidence of periarticular osteophytes and joint space narrowing by imaging studies. There is no active infection.  Patient Active Problem List   Diagnosis Date Noted   S/P total knee arthroplasty, left 01/13/2018   Degenerative arthritis of left knee 01/08/2018   Bladder cancer ([place]) 09/14/2013   Arthritis of shoulder region, left 11/30/2011   Past Medical History:  Diagnosis Date   Arthritis    SHOULDERS AND KNEES - ARTHRITIS AND PAIN   Bladder cancer ([place])    "[name]"   prostate   GERD (gastroesophageal reflux disease)    under control   Heart murmur    PT TOLD SLIGHT HEART MURMUR - DOESN'T CAUSE ANY PROBLEMS   History of diverticulitis of colon    Hypertension    Insomnia     Past Surgical History:  Procedure Laterality Date   CARDIOVASCULAR STRESS TEST  10-14-2008  DR BERRY   NORMAL PERFUSION STUDY/ NO ISCHEMIA/ EF 61%   CYSTOSCOPY N/A 03/02/2013   Procedure: CYSTOSCOPY WITH INSTILLATION OF MITOMYCIN C;  Surgeon: Jeremiah Wright;  Location: [facility];   Service: Urology;  Laterality: N/A;   CYSTOSCOPY N/A 12/30/2013   Procedure: CYSTOSCOPY WITH INDOCYANINE GREEN DYE INJECTION;  Surgeon: Jeremiah Wright;  [facility];  Service: Urology;  Laterality: N/A;   CYSTOSCOPY W/ RETROGRADES Right 04/17/2012   Procedure: CYSTOSCOPY WITH RETROGRADE PYELOGRAM;  Surgeon: Jeremiah Wright;  Location: [facility];  Service: Urology;  Laterality: Right;   CYSTOSCOPY WITH BIOPSY Right 04/17/2012   Procedure: CYSTOSCOPY WITH  BIOPSY OF RIGHT BLADDER DIVERTICULUM TUMOR BUTTON VAPORIZATION OF TUMOR INSIDE DIVERTICULUM;  Surgeon: Jeremiah Wright;  Location: [facility];  Service: Urology;  Laterality: Right;  CYSTOSCOPY WITH COLD CUP BIOPSY OF RIGHT BLADDER DIVERTICULUM TUMOR/HOLMIUM LASER OF BLADDER TUMOR/POSSIBLE BUTTON VAPORIZATION OF TUMOR INSIDE DIVERTICULUM   CYSTOSCOPY WITH BIOPSY N/A 11/07/2012   Procedure: CYSTOSCOPY WITH COLD CUP BIOPSY AND FULGERATION;  Surgeon: Jeremiah Wright;  Location: [facility];  Service: Urology;  Laterality: N/A;   CYSTOSCOPY WITH RETROGRADE PYELOGRAM, URETEROSCOPY AND STENT PLACEMENT Bilateral 09/14/2013   Procedure: CYSTO BILATERAL RETROGRADE PYELOGRAM;  Surgeon: Jeremiah Wright;  [facility];  Service: Urology;  Laterality: Bilateral;   FATTY TUMOR     REMOVED FROM BACK   JOINT REPLACEMENT     Left total knee arthroplasty Jeremiah Wright  01-13-18   KNEE ARTHROSCOPY Bilateral >10 YRS AGO   OPEN ACROMIONECTOMY/ RESECTION DISTAL CLAVICLE/ REPAIR ROTATOR CUFF  03-15-2009   LEFT SHOULDER   REVERSE SHOULDER ARTHROPLASTY  11/29/2011   Procedure: REVERSE  SHOULDER ARTHROPLASTY;  Surgeon: Jeremiah Wright;  Location: [facility];  Service: Orthopedics;  Laterality: Left;  Left Reverse Total Shoulder Arthroplasty with left supra clavicular block   ROBOT ASSISTED LAPAROSCOPIC COMPLETE CYSTECT ILEAL CONDUIT N/A 12/30/2013   Procedure: XI ROBOTIC ASSISTED  LAPAROSCOPIC COMPLETE CYSTECTOMY, ILEAL CONDUIT;  Surgeon: Jeremiah Wright;  Location: [facility];  Service: Urology;  Laterality: N/A;   SECONDARY CLOSURE ARM     RT ARM INJURY   SHOULDER OPEN ROTATOR CUFF REPAIR  07-18-2001   AND PARTIAL ACROMIONECTOMY/ ACROMIOPLASTY ,  RIGHT SHOULDER   TONSILLECTOMY  as child   TOTAL KNEE ARTHROPLASTY Left 01/13/2018   Procedure: TOTAL KNEE ARTHROPLASTY;  Surgeon: Jeremiah Wright;  Location[facility];  Service: Orthopedics;  Laterality: Left;   TRANSTHORACIC ECHOCARDIOGRAM  09-07-2008   DR BERRY   LVEF 55-65%/ BORDERLINE LV HYPERTROPHY   TRANSURETHRAL RESECTION OF BLADDER TUMOR N/A 03/02/2013   Procedure: TRANSURETHRAL RESECTION OF BLADDER TUMOR (TURBT);  Surgeon: Jeremiah Wright;  Location: [facility];  Service: Urology;  Laterality: N/A;   TRANSURETHRAL RESECTION OF BLADDER TUMOR N/A 09/14/2013   Procedure: TRANSURETHRAL RESECTION OF BLADDER TUMOR (TURBT)/TURP WITH GYRUS;  Surgeon: Jeremiah Wright;  [facility];  Service: Urology;  Laterality: N/A;   TRANSURETHRAL RESECTION OF BLADDER TUMOR WITH MITOMYCIN-C N/A 11/07/2012   Procedure: TRANSURETHRAL RESECTION OF BLADDER TUMOR WITH MITOMYCIN-C;  Surgeon: Jeremiah Wright;  Location[facility];  Service: Urology;  Laterality: N/A;    Current Outpatient Medications  Medication Sig Dispense Refill Last Dose   aspirin EC 81 MG tablet Take 1 tablet (81 mg total) by mouth 2 (two) times daily. 60 tablet 0    carvedilol (COREG) 12.5 MG tablet Take 12.5 mg by mouth 2 (two) times daily with a meal.  11    hydrocortisone cream 1 % Apply 1 application topically 2 (two) times daily as needed for itching.      LORazepam (ATIVAN) 0.5 MG tablet Take 0.5 mg by mouth at bedtime as needed for anxiety.   2    oxyCODONE-acetaminophen (PERCOCET/ROXICET) 5-325 MG tablet Take 1 tablet by mouth every 4 (four) hours as needed for severe pain. 30 tablet 0    tiZANidine  (ZANAFLEX) 2 MG tablet Take 1 tablet (2 mg total) by mouth every 6 (six) hours as needed. 60 tablet 0    valsartan (DIOVAN) 40 MG tablet Take 20 mg by mouth 2 (two) times daily.      zolpidem (AMBIEN) 10 MG tablet Take 10 mg by mouth at bedtime as needed for sleep.       No current facility-administered medications for this visit.   No Known Allergies  Social History   Tobacco Use   Smoking status: Former    Types: Cigars    Quit date: 11/20/2003    Years since quitting: 18.0   Smokeless tobacco: Never  Substance Use Topics   Alcohol use: No    No family history on file.   Review of Systems  HENT:  Positive for hearing loss.   Respiratory:  Positive for cough.   Cardiovascular:  Positive for leg swelling.  Gastrointestinal:  Positive for nausea and vomiting.  Musculoskeletal:  Positive for joint swelling.  All other systems reviewed and are negative.   Objective:  Physical Exam Constitutional:      General: He is not in acute distress.    Appearance: Normal appearance.  HENT:  Head: Normocephalic and atraumatic.  Eyes:     Extraocular Movements: Extraocular movements intact.     Pupils: Pupils are equal, round, and reactive to light.  Cardiovascular:     Rate and Rhythm: Normal rate and regular rhythm.     Pulses: Normal pulses.     Heart sounds: Normal heart sounds.  Pulmonary:     Effort: Pulmonary effort is normal.     Breath sounds: Normal breath sounds. No wheezing or rales.  Abdominal:     General: Abdomen is flat. Bowel sounds are normal. There is no distension.     Palpations: Abdomen is soft.     Tenderness: There is no abdominal tenderness.  Musculoskeletal:     Cervical back: Normal range of motion and neck supple.     Right knee: Swelling and bony tenderness present. No effusion or erythema. Tenderness present.     Comments: Previous ant/med knee incision  Lymphadenopathy:     Cervical: No cervical adenopathy.  Skin:    General: Skin is warm and  dry.     Findings: No erythema or rash.  Neurological:     General: No focal deficit present.     Mental Status: He is alert and oriented to person, place, and time.  Psychiatric:        Mood and Affect: Mood normal.        Behavior: Behavior normal.     Vital signs in last 24 hours: '@VSRANGES'$ @  Labs:   Estimated body mass index is 33.73 kg/m as calculated from the following:   Height as of 01/13/18: '5\' 6"'$  (1.676 m).   Weight as of 01/13/18: 94.8 kg.   Imaging Review Plain radiographs demonstrate moderate degenerative joint disease of the right knee(s). The overall alignment ismild varus. The bone quality appears to be good for age and reported activity level.      Assessment/Plan:  End stage arthritis, right knee   The patient history, physical examination, clinical judgment of the provider and imaging studies are consistent with end stage degenerative joint disease of the right knee(s) and total knee arthroplasty is deemed medically necessary. The treatment options including medical management, injection therapy arthroscopy and arthroplasty were discussed at length. The risks and benefits of total knee arthroplasty were presented and reviewed. The risks due to aseptic loosening, infection, stiffness, patella tracking problems, thromboembolic complications and other imponderables were discussed. The patient acknowledged the explanation, agreed to proceed with the plan and consent was signed. Patient is being admitted for inpatient treatment for surgery, pain control, PT, OT, prophylactic antibiotics, VTE prophylaxis, progressive ambulation and ADL's and discharge planning. The patient is planning to be discharged home with home health services    Anticipated LOS equal to or greater than 2 midnights due to - Age 79 and older with one or more of the following:  - Obesity  - Expected need for hospital services (PT, OT, Nursing) required for safe  discharge  - Anticipated need  for postoperative skilled nursing care or inpatient rehab  - Active co-morbidities: None OR   - Unanticipated findings during/Post Surgery: None  - Patient is a high risk of re-admission due to: None

## 2021-11-22 NOTE — Progress Notes (Addendum)
Anesthesia Review:  PCP: Janie Morning- 10/09/21 clearance on chart LOV 10/03/21 on chart  Cardiologist : none  Urology- Dr Alexis Frock clearance 10/23/21 on chart  Chest x-ray : 02/16/21- 2 vuew  EKG : 11/27/21  Echo : Stress test: Cardiac Cath :  Activity level: can do a flight of stairs without difficulty  Sleep Study/ CPAP  none  Fasting Blood Sugar :      / Checks Blood Sugar -- times a day:   Blood Thinner/ Instructions /Last Dose: ASA / Instructions/ Last Dose :   81 mg aspirin  Blood pressure at preop was  181/98in left arm and in right arm was  190/96.  PT denies any chest pain, shortness of breath, dizziness or blurred vision.  PT states his blood pressure is always elevated when goes to MD  Pt reports he took blp meds this am.  REcheck in right arm was 184/92.  Daughter with pt at time of preop appt PT has ostomy- pt to bring supples on DOS.  EKG was overlooked by phlebotomy staff at preop .  Will be done DOS.  CMP done 11/27/21 routed to DR [facility].

## 2021-11-22 NOTE — Patient Instructions (Signed)
SURGICAL WAITING ROOM VISITATION Patients having surgery or a procedure may have no more than 2 support people in the waiting area - these visitors may rotate.   Children under the age of 55 must have an adult with them who is not the patient. If the patient needs to stay at the hospital during part of their recovery, the visitor guidelines for inpatient rooms apply. Pre-op nurse will coordinate an appropriate time for 1 support person to accompany patient in pre-op.  This support person may not rotate.    Please refer to the [facility] website for the visitor guidelines for Inpatients (after your surgery is over and you are in a regular room).       Your procedure is scheduled on:  12/08/21    Report to [facility] Main Entrance    Report to admitting at  [place] AM   Call this number if you have problems the morning of surgery [phone]   Do not eat food :After Midnight.   After Midnight you may have the following liquids until ___ 0430___ AM DAY OF SURGERY  Water Non-Citrus Juices (without pulp, NO RED) Carbonated Beverages Black Coffee (NO MILK/CREAM OR CREAMERS, sugar ok)  Clear Tea (NO MILK/CREAM OR CREAMERS, sugar ok) regular and decaf                             Plain Jell-O (NO RED)                                           Fruit ices (not with fruit pulp, NO RED)                                     Popsicles (NO RED)                                                               Sports drinks like Gatorade (NO RED)                      The day of surgery:  Drink ONE (1) Pre-Surgery Clear Ensure or G2 at   0430AM  ( have completed by ) the morning of surgery. Drink in one sitting. Do not sip.  This drink was given to you during your hospital  pre-op appointment visit. Nothing else to drink after completing the  Pre-Surgery Clear Ensure or G2.          If you have questions, please contact your surgeon's office.       Oral Hygiene is also important to  reduce your risk of infection.                                    Remember - BRUSH YOUR TEETH THE MORNING OF SURGERY WITH YOUR REGULAR TOOTHPASTE   Do NOT smoke after Midnight   Take these medicines the morning of surgery with A SIP OF WATER:  coreg   DO  NOT TAKE ANY ORAL DIABETIC MEDICATIONS DAY OF YOUR SURGERY  Bring CPAP mask and tubing day of surgery.                              You may not have any metal on your body including hair pins, jewelry, and body piercing             Do not wear make-up, lotions, powders, perfumes/cologne, or deodorant  Do not wear nail polish including gel and S&S, artificial/acrylic nails, or any other type of covering on natural nails including finger and toenails. If you have artificial nails, gel coating, etc. that needs to be removed by a nail salon please have this removed prior to surgery or surgery may need to be canceled/ delayed if the surgeon/ anesthesia feels like they are unable to be safely monitored.   Do not shave  48 hours prior to surgery.               Men may shave face and neck.   Do not bring valuables to the hospital. [place].   Contacts, dentures or bridgework may not be worn into surgery.   Bring small overnight bag day of surgery.   DO NOT [place]. PHARMACY WILL DISPENSE MEDICATIONS LISTED ON YOUR MEDICATION LIST TO YOU DURING YOUR ADMISSION [place]!    Patients discharged on the day of surgery will not be allowed to drive home.  Someone NEEDS to stay with you for the first 24 hours after anesthesia.   Special Instructions: Bring a copy of your healthcare power of attorney and living will documents the day of surgery if you haven't scanned them before.              Please read over the following fact sheets you were given: IF [place] [phone]   If you received a COVID  test during your pre-op visit  it is requested that you wear a mask when out in public, stay away from anyone that may not be feeling well and notify your surgeon if you develop symptoms. If you test positive for Covid or have been in contact with anyone that has tested positive in the last 10 days please notify you surgeon.     [place] - Preparing for Surgery Before surgery, you can play an important role.  Because skin is not sterile, your skin needs to be as free of germs as possible.  You can reduce the number of germs on your skin by washing with CHG (chlorahexidine gluconate) soap before surgery.  CHG is an antiseptic cleaner which kills germs and bonds with the skin to continue killing germs even after washing. Please DO NOT use if you have an allergy to CHG or antibacterial soaps.  If your skin becomes reddened/irritated stop using the CHG and inform your nurse when you arrive at Short Stay. Do not shave (including legs and underarms) for at least 48 hours prior to the first CHG shower.  You may shave your face/neck. Please follow these instructions carefully:  1.  Shower with CHG Soap the night before surgery and the  morning of Surgery.  2.  If you choose to wash your hair, wash your hair first as usual with  your  normal  shampoo.  3.  After you shampoo, rinse your hair and body thoroughly to remove the  shampoo.                           4.  Use CHG as you would any other liquid soap.  You can apply chg directly  to the skin and wash                       Gently with a scrungie or clean washcloth.  5.  Apply the CHG Soap to your body ONLY FROM THE NECK DOWN.   Do not use on face/ open                           Wound or open sores. Avoid contact with eyes, ears mouth and genitals (private parts).                       Wash face,  Genitals (private parts) with your normal soap.             6.  Wash thoroughly, paying special attention to the area where your surgery  will be performed.  7.   Thoroughly rinse your body with warm water from the neck down.  8.  DO NOT shower/wash with your normal soap after using and rinsing off  the CHG Soap.                9.  Pat yourself dry with a clean towel.            10.  Wear clean pajamas.            11.  Place clean sheets on your bed the night of your first shower and do not  sleep with pets. Day of Surgery : Do not apply any lotions/deodorants the morning of surgery.  Please wear clean clothes to the hospital/surgery center.  FAILURE TO FOLLOW THESE INSTRUCTIONS MAY RESULT IN THE CANCELLATION OF YOUR SURGERY PATIENT SIGNATURE_________________________________  NURSE SIGNATURE__________________________________  ________________________________________________________________________

## 2021-11-27 ENCOUNTER — Encounter (HOSPITAL_COMMUNITY)
Admission: RE | Admit: 2021-11-27 | Discharge: 2021-11-27 | Disposition: A | Discharge status: Home / Self Care | Payer: PPO | Source: Ambulatory Visit | Attending: Orthopedic Surgery | Admitting: Orthopedic Surgery

## 2021-11-27 ENCOUNTER — Other Ambulatory Visit: Payer: Self-pay

## 2021-11-27 ENCOUNTER — Encounter (HOSPITAL_COMMUNITY): Payer: Self-pay

## 2021-11-27 VITALS — BP 184/92 | HR 71 | Temp 98.5°F | Resp 16 | Ht 65.0 in | Wt 215.0 lb

## 2021-11-27 DIAGNOSIS — M25561 Pain in right knee: Secondary | ICD-10-CM | POA: Diagnosis not present

## 2021-11-27 DIAGNOSIS — Z01818 Encounter for other preprocedural examination: Secondary | ICD-10-CM | POA: Insufficient documentation

## 2021-11-27 DIAGNOSIS — G8929 Other chronic pain: Secondary | ICD-10-CM | POA: Diagnosis not present

## 2021-11-27 LAB — CBC WITH DIFFERENTIAL/PLATELET
Abs Immature Granulocytes: 0.05 10*3/uL (ref 0.00–0.07)
Basophils Absolute: 0.1 10*3/uL (ref 0.0–0.1)
Basophils Relative: 2 %
Eosinophils Absolute: 0.2 10*3/uL (ref 0.0–0.5)
Eosinophils Relative: 2 %
HCT: 41.1 % (ref 39.0–52.0)
Hemoglobin: 13.5 g/dL (ref 13.0–17.0)
Immature Granulocytes: 1 %
Lymphocytes Relative: 23 %
Lymphs Abs: 1.7 10*3/uL (ref 0.7–4.0)
MCH: 32.5 pg (ref 26.0–34.0)
MCHC: 32.8 g/dL (ref 30.0–36.0)
MCV: 98.8 fL (ref 80.0–100.0)
Monocytes Absolute: 0.8 10*3/uL (ref 0.1–1.0)
Monocytes Relative: 11 %
Neutro Abs: 4.6 10*3/uL (ref 1.7–7.7)
Neutrophils Relative %: 61 %
Platelets: 281 10*3/uL (ref 150–400)
RBC: 4.16 MIL/uL — ABNORMAL LOW (ref 4.22–5.81)
RDW: 12.2 % (ref 11.5–15.5)
WBC: 7.5 10*3/uL (ref 4.0–10.5)
nRBC: 0 % (ref 0.0–0.2)

## 2021-11-27 LAB — COMPREHENSIVE METABOLIC PANEL
ALT: 15 U/L (ref 0–44)
AST: 20 U/L (ref 15–41)
Albumin: 4.1 g/dL (ref 3.5–5.0)
Alkaline Phosphatase: 78 U/L (ref 38–126)
Anion gap: 5 (ref 5–15)
BUN: 29 mg/dL — ABNORMAL HIGH (ref 8–23)
CO2: 21 mmol/L — ABNORMAL LOW (ref 22–32)
Calcium: 8.9 mg/dL (ref 8.9–10.3)
Chloride: 110 mmol/L (ref 98–111)
Creatinine, Ser: 1.87 mg/dL — ABNORMAL HIGH (ref 0.61–1.24)
GFR, Estimated: 36 mL/min — ABNORMAL LOW (ref >60)
Glucose, Bld: 93 mg/dL (ref 70–99)
Potassium: 4.8 mmol/L (ref 3.5–5.1)
Sodium: 136 mmol/L (ref 135–145)
Total Bilirubin: 0.7 mg/dL (ref 0.3–1.2)
Total Protein: 7.6 g/dL (ref 6.5–8.1)

## 2021-11-27 LAB — SURGICAL PCR SCREEN
MRSA, PCR: NEGATIVE
Staphylococcus aureus: NEGATIVE

## 2021-11-27 LAB — TYPE AND SCREEN
ABO/RH(D): A NEG
Antibody Screen: NEGATIVE

## 2021-12-07 ENCOUNTER — Encounter (HOSPITAL_COMMUNITY): Payer: Self-pay | Admitting: Orthopedic Surgery

## 2021-12-07 MED ORDER — TRANEXAMIC ACID 1000 MG/10ML IV SOLN
2000.0000 mg | INTRAVENOUS | Status: DC
  Filled 2021-12-07: qty 20

## 2021-12-07 NOTE — Anesthesia Preprocedure Evaluation (Addendum)
Anesthesia Evaluation  Patient identified by MRN, date of birth, ID band Patient awake    Reviewed: Allergy & Precautions, NPO status , Patient's Chart, lab work & pertinent test results  Airway Mallampati: I       Dental  (+) Edentulous Upper, Edentulous Lower   Pulmonary former smoker,    breath sounds clear to auscultation       Cardiovascular hypertension, Pt. on home beta blockers and Pt. on medications + Valvular Problems/Murmurs  Rhythm:Regular Rate:Normal + Systolic murmurs - Murmur needs further evaluation   Neuro/Psych negative neurological ROS  negative psych ROS   GI/Hepatic negative GI ROS, Neg liver ROS,   Endo/Other  negative endocrine ROS  Renal/GU negative Renal ROS     Musculoskeletal  (+) Arthritis ,   Abdominal Normal abdominal exam  (+)   Peds  Hematology negative hematology ROS (+)   Anesthesia Other Findings   Reproductive/Obstetrics                            Anesthesia Physical Anesthesia Plan  ASA: 2  Anesthesia Plan: General   Post-op Pain Management: Regional block*   Induction: Intravenous  PONV Risk Score and Plan: 2 and Ondansetron and Treatment may vary due to age or medical condition  Airway Management Planned: Oral ETT  Additional Equipment: None  Intra-op Plan:   Post-operative Plan: Extubation in OR  Informed Consent: I have reviewed the patients History and Physical, chart, labs and discussed the procedure including the risks, benefits and alternatives for the proposed anesthesia with the patient or authorized representative who has indicated his/her understanding and acceptance.     Dental advisory given  Plan Discussed with: CRNA  Anesthesia Plan Comments: (Lab Results      Component                Value               Date                      WBC                      7.5                 11/27/2021                HGB                       13.5                11/27/2021                HCT                      41.1                11/27/2021                MCV                      98.8                11/27/2021                PLT  281                 11/27/2021           )       Anesthesia Quick Evaluation

## 2021-12-08 ENCOUNTER — Encounter (HOSPITAL_COMMUNITY)
Admission: RE | Disposition: A | Discharge status: Home / Self Care | Payer: Self-pay | Source: Ambulatory Visit | Attending: Orthopedic Surgery

## 2021-12-08 ENCOUNTER — Encounter (HOSPITAL_COMMUNITY): Payer: Self-pay | Admitting: Orthopedic Surgery

## 2021-12-08 ENCOUNTER — Other Ambulatory Visit: Payer: Self-pay

## 2021-12-08 ENCOUNTER — Ambulatory Visit (HOSPITAL_COMMUNITY): Payer: PPO | Admitting: Physician Assistant

## 2021-12-08 ENCOUNTER — Ambulatory Visit (HOSPITAL_COMMUNITY)
Admission: RE | Admit: 2021-12-08 | Discharge: 2021-12-09 | Disposition: A | Discharge status: Home / Self Care | Payer: PPO | Source: Ambulatory Visit | Attending: Orthopedic Surgery | Admitting: Orthopedic Surgery

## 2021-12-08 ENCOUNTER — Ambulatory Visit (HOSPITAL_BASED_OUTPATIENT_CLINIC_OR_DEPARTMENT_OTHER): Payer: PPO | Admitting: Anesthesiology

## 2021-12-08 DIAGNOSIS — Z87891 Personal history of nicotine dependence: Secondary | ICD-10-CM | POA: Diagnosis not present

## 2021-12-08 DIAGNOSIS — M7989 Other specified soft tissue disorders: Secondary | ICD-10-CM | POA: Diagnosis not present

## 2021-12-08 DIAGNOSIS — I10 Essential (primary) hypertension: Secondary | ICD-10-CM | POA: Insufficient documentation

## 2021-12-08 DIAGNOSIS — M1711 Unilateral primary osteoarthritis, right knee: Secondary | ICD-10-CM | POA: Diagnosis present

## 2021-12-08 DIAGNOSIS — R011 Cardiac murmur, unspecified: Secondary | ICD-10-CM | POA: Diagnosis not present

## 2021-12-08 DIAGNOSIS — H9190 Unspecified hearing loss, unspecified ear: Secondary | ICD-10-CM | POA: Diagnosis not present

## 2021-12-08 DIAGNOSIS — R112 Nausea with vomiting, unspecified: Secondary | ICD-10-CM | POA: Diagnosis not present

## 2021-12-08 DIAGNOSIS — Z01818 Encounter for other preprocedural examination: Secondary | ICD-10-CM

## 2021-12-08 DIAGNOSIS — R600 Localized edema: Secondary | ICD-10-CM | POA: Insufficient documentation

## 2021-12-08 HISTORY — PX: TOTAL KNEE ARTHROPLASTY: SHX125

## 2021-12-08 LAB — ABO/RH: ABO/RH(D): A NEG

## 2021-12-08 SURGERY — ARTHROPLASTY, KNEE, TOTAL
Anesthesia: General | Site: Knee | Laterality: Right

## 2021-12-08 MED ORDER — ROCURONIUM BROMIDE 10 MG/ML (PF) SYRINGE
PREFILLED_SYRINGE | INTRAVENOUS | Status: AC
  Filled 2021-12-08: qty 10

## 2021-12-08 MED ORDER — 0.9 % SODIUM CHLORIDE (POUR BTL) OPTIME
TOPICAL | Status: DC | PRN
  Administered 2021-12-08: 1000 mL

## 2021-12-08 MED ORDER — LIDOCAINE 2% (20 MG/ML) 5 ML SYRINGE
INTRAMUSCULAR | Status: DC | PRN
  Administered 2021-12-08: 40 mg via INTRAVENOUS

## 2021-12-08 MED ORDER — SODIUM CHLORIDE (PF) 0.9 % IJ SOLN
INTRAMUSCULAR | Status: AC
  Filled 2021-12-08: qty 50

## 2021-12-08 MED ORDER — FUROSEMIDE 20 MG PO TABS: 10.0000 mg | ORAL_TABLET | Freq: Every day | ORAL | Status: DC | PRN

## 2021-12-08 MED ORDER — MIDAZOLAM HCL 2 MG/2ML IJ SOLN
INTRAMUSCULAR | Status: DC | PRN
  Administered 2021-12-08: 1 mg via INTRAVENOUS

## 2021-12-08 MED ORDER — HYDROMORPHONE HCL 1 MG/ML IJ SOLN
INTRAMUSCULAR | Status: AC
  Filled 2021-12-08: qty 1

## 2021-12-08 MED ORDER — LACTATED RINGERS IV SOLN: INTRAVENOUS | Status: DC

## 2021-12-08 MED ORDER — POVIDONE-IODINE 10 % EX SWAB
2.0000 | Freq: Once | CUTANEOUS | Status: AC
  Administered 2021-12-08: 2 via TOPICAL

## 2021-12-08 MED ORDER — TRANEXAMIC ACID-NACL 1000-0.7 MG/100ML-% IV SOLN
1000.0000 mg | INTRAVENOUS | Status: AC
  Administered 2021-12-08: 1000 mg via INTRAVENOUS
  Filled 2021-12-08: qty 100

## 2021-12-08 MED ORDER — AMISULPRIDE (ANTIEMETIC) 5 MG/2ML IV SOLN: 10.0000 mg | Freq: Once | INTRAVENOUS | Status: DC | PRN

## 2021-12-08 MED ORDER — ONDANSETRON HCL 4 MG/2ML IJ SOLN
INTRAMUSCULAR | Status: DC | PRN
  Administered 2021-12-08: 4 mg via INTRAVENOUS

## 2021-12-08 MED ORDER — FENTANYL CITRATE (PF) 100 MCG/2ML IJ SOLN
INTRAMUSCULAR | Status: AC
  Filled 2021-12-08: qty 2

## 2021-12-08 MED ORDER — ACETAMINOPHEN 325 MG PO TABS: 325.0000 mg | ORAL_TABLET | Freq: Once | ORAL | Status: DC | PRN

## 2021-12-08 MED ORDER — SORBITOL 70 % SOLN: 30.0000 mL | Freq: Every day | Status: DC | PRN

## 2021-12-08 MED ORDER — BUPIVACAINE LIPOSOME 1.3 % IJ SUSP
INTRAMUSCULAR | Status: AC
  Filled 2021-12-08: qty 20

## 2021-12-08 MED ORDER — WATER FOR IRRIGATION, STERILE IR SOLN
Status: DC | PRN
  Administered 2021-12-08: 2000 mL

## 2021-12-08 MED ORDER — TRANEXAMIC ACID-NACL 1000-0.7 MG/100ML-% IV SOLN
1000.0000 mg | Freq: Once | INTRAVENOUS | Status: AC
  Administered 2021-12-08: 1000 mg via INTRAVENOUS
  Filled 2021-12-08: qty 100

## 2021-12-08 MED ORDER — CEFAZOLIN SODIUM-DEXTROSE 2-4 GM/100ML-% IV SOLN
2.0000 g | Freq: Four times a day (QID) | INTRAVENOUS | Status: AC
  Administered 2021-12-08 (×2): 2 g via INTRAVENOUS
  Filled 2021-12-08 (×2): qty 100

## 2021-12-08 MED ORDER — ORAL CARE MOUTH RINSE: 15.0000 mL | Freq: Once | OROMUCOSAL | Status: AC

## 2021-12-08 MED ORDER — SUGAMMADEX SODIUM 200 MG/2ML IV SOLN
INTRAVENOUS | Status: DC | PRN
  Administered 2021-12-08: 200 mg via INTRAVENOUS

## 2021-12-08 MED ORDER — PROMETHAZINE HCL 25 MG/ML IJ SOLN: 6.2500 mg | INTRAMUSCULAR | Status: DC | PRN

## 2021-12-08 MED ORDER — MENTHOL 3 MG MT LOZG: 1.0000 | LOZENGE | OROMUCOSAL | Status: DC | PRN

## 2021-12-08 MED ORDER — ACETAMINOPHEN 10 MG/ML IV SOLN
INTRAVENOUS | Status: AC
  Filled 2021-12-08: qty 100

## 2021-12-08 MED ORDER — MIDAZOLAM HCL 2 MG/2ML IJ SOLN
INTRAMUSCULAR | Status: AC
  Filled 2021-12-08: qty 2

## 2021-12-08 MED ORDER — ONDANSETRON HCL 4 MG PO TABS: 4.0000 mg | ORAL_TABLET | Freq: Four times a day (QID) | ORAL | Status: DC | PRN

## 2021-12-08 MED ORDER — BUPIVACAINE-EPINEPHRINE (PF) 0.25% -1:200000 IJ SOLN
INTRAMUSCULAR | Status: DC | PRN
  Administered 2021-12-08: 30 mL

## 2021-12-08 MED ORDER — SODIUM CHLORIDE 0.9 % IR SOLN
Status: DC | PRN
  Administered 2021-12-08: 1000 mL

## 2021-12-08 MED ORDER — FENTANYL CITRATE (PF) 100 MCG/2ML IJ SOLN
INTRAMUSCULAR | Status: DC | PRN
  Administered 2021-12-08 (×4): 50 ug via INTRAVENOUS

## 2021-12-08 MED ORDER — CHLORHEXIDINE GLUCONATE 0.12 % MT SOLN
15.0000 mL | Freq: Once | OROMUCOSAL | Status: AC
  Administered 2021-12-08: 15 mL via OROMUCOSAL

## 2021-12-08 MED ORDER — PROPOFOL 1000 MG/100ML IV EMUL
INTRAVENOUS | Status: AC
  Filled 2021-12-08: qty 100

## 2021-12-08 MED ORDER — IRBESARTAN 75 MG PO TABS
75.0000 mg | ORAL_TABLET | Freq: Every day | ORAL | Status: DC
  Administered 2021-12-08 – 2021-12-09 (×2): 75 mg via ORAL
  Filled 2021-12-08 (×2): qty 1

## 2021-12-08 MED ORDER — OXYCODONE HCL 5 MG PO TABS
5.0000 mg | ORAL_TABLET | ORAL | Status: DC | PRN
  Administered 2021-12-08 – 2021-12-09 (×5): 10 mg via ORAL
  Filled 2021-12-08 (×5): qty 2

## 2021-12-08 MED ORDER — DEXAMETHASONE SODIUM PHOSPHATE 10 MG/ML IJ SOLN
INTRAMUSCULAR | Status: DC | PRN
  Administered 2021-12-08: 10 mg via INTRAVENOUS

## 2021-12-08 MED ORDER — ROCURONIUM BROMIDE 10 MG/ML (PF) SYRINGE
PREFILLED_SYRINGE | INTRAVENOUS | Status: DC | PRN
  Administered 2021-12-08: 50 mg via INTRAVENOUS

## 2021-12-08 MED ORDER — CELECOXIB 200 MG PO CAPS
400.0000 mg | ORAL_CAPSULE | Freq: Once | ORAL | Status: AC
  Administered 2021-12-08: 400 mg via ORAL
  Filled 2021-12-08: qty 2

## 2021-12-08 MED ORDER — CARVEDILOL 12.5 MG PO TABS
12.5000 mg | ORAL_TABLET | Freq: Every day | ORAL | Status: DC
  Administered 2021-12-08 – 2021-12-09 (×2): 12.5 mg via ORAL
  Filled 2021-12-08 (×2): qty 1

## 2021-12-08 MED ORDER — HYDROMORPHONE HCL 1 MG/ML IJ SOLN
0.2500 mg | INTRAMUSCULAR | Status: DC | PRN
  Administered 2021-12-08 (×3): 0.5 mg via INTRAVENOUS

## 2021-12-08 MED ORDER — HYDROCORTISONE 1 % EX CREA: 1.0000 | TOPICAL_CREAM | Freq: Two times a day (BID) | CUTANEOUS | Status: DC | PRN

## 2021-12-08 MED ORDER — CEFAZOLIN SODIUM-DEXTROSE 2-4 GM/100ML-% IV SOLN
2.0000 g | INTRAVENOUS | Status: AC
  Administered 2021-12-08: 2 g via INTRAVENOUS
  Filled 2021-12-08: qty 100

## 2021-12-08 MED ORDER — SODIUM CHLORIDE 0.9 % IV SOLN: INTRAVENOUS | Status: DC

## 2021-12-08 MED ORDER — ONDANSETRON HCL 4 MG/2ML IJ SOLN: 4.0000 mg | Freq: Four times a day (QID) | INTRAMUSCULAR | Status: DC | PRN

## 2021-12-08 MED ORDER — FLEET ENEMA 7-19 GM/118ML RE ENEM: 1.0000 | ENEMA | Freq: Once | RECTAL | Status: DC | PRN

## 2021-12-08 MED ORDER — DOCUSATE SODIUM 100 MG PO CAPS
100.0000 mg | ORAL_CAPSULE | Freq: Two times a day (BID) | ORAL | Status: DC
  Administered 2021-12-08 – 2021-12-09 (×2): 100 mg via ORAL
  Filled 2021-12-08 (×2): qty 1

## 2021-12-08 MED ORDER — PROPOFOL 10 MG/ML IV BOLUS
INTRAVENOUS | Status: DC | PRN
  Administered 2021-12-08: 100 mg via INTRAVENOUS

## 2021-12-08 MED ORDER — OXYCODONE HCL 5 MG PO TABS: ORAL_TABLET | ORAL | 0 refills | Status: AC

## 2021-12-08 MED ORDER — PROPOFOL 10 MG/ML IV BOLUS
INTRAVENOUS | Status: AC
  Filled 2021-12-08: qty 20

## 2021-12-08 MED ORDER — METOCLOPRAMIDE HCL 5 MG PO TABS: 5.0000 mg | ORAL_TABLET | Freq: Three times a day (TID) | ORAL | Status: DC | PRN

## 2021-12-08 MED ORDER — LEFLUNOMIDE 10 MG PO TABS
20.0000 mg | ORAL_TABLET | Freq: Every day | ORAL | Status: DC
  Administered 2021-12-09: 20 mg via ORAL
  Filled 2021-12-08: qty 2

## 2021-12-08 MED ORDER — POLYETHYLENE GLYCOL 3350 17 G PO PACK: 17.0000 g | PACK | Freq: Every day | ORAL | Status: DC | PRN

## 2021-12-08 MED ORDER — ACETAMINOPHEN 325 MG PO TABS: 325.0000 mg | ORAL_TABLET | Freq: Four times a day (QID) | ORAL | Status: DC | PRN

## 2021-12-08 MED ORDER — BUPIVACAINE LIPOSOME 1.3 % IJ SUSP
INTRAMUSCULAR | Status: DC | PRN
  Administered 2021-12-08: 20 mL

## 2021-12-08 MED ORDER — ACETAMINOPHEN 10 MG/ML IV SOLN
1000.0000 mg | Freq: Once | INTRAVENOUS | Status: DC | PRN
  Administered 2021-12-08: 1000 mg via INTRAVENOUS

## 2021-12-08 MED ORDER — TRANEXAMIC ACID 1000 MG/10ML IV SOLN
INTRAVENOUS | Status: DC | PRN
  Administered 2021-12-08: 2000 mg via TOPICAL

## 2021-12-08 MED ORDER — ASPIRIN 81 MG PO CHEW
81.0000 mg | CHEWABLE_TABLET | Freq: Two times a day (BID) | ORAL | Status: DC
  Administered 2021-12-09: 81 mg via ORAL
  Filled 2021-12-08: qty 1

## 2021-12-08 MED ORDER — ZOLPIDEM TARTRATE 10 MG PO TABS
10.0000 mg | ORAL_TABLET | Freq: Every evening | ORAL | Status: DC | PRN
  Administered 2021-12-08: 10 mg via ORAL
  Filled 2021-12-08: qty 1

## 2021-12-08 MED ORDER — TRAMADOL HCL 50 MG PO TABS
50.0000 mg | ORAL_TABLET | Freq: Four times a day (QID) | ORAL | Status: DC | PRN
  Administered 2021-12-09: 50 mg via ORAL
  Filled 2021-12-08: qty 1

## 2021-12-08 MED ORDER — SODIUM CHLORIDE 0.9% FLUSH
INTRAVENOUS | Status: DC | PRN
  Administered 2021-12-08: 50 mL

## 2021-12-08 MED ORDER — BUPIVACAINE LIPOSOME 1.3 % IJ SUSP: 20.0000 mL | Freq: Once | INTRAMUSCULAR | Status: DC

## 2021-12-08 MED ORDER — DEXAMETHASONE SODIUM PHOSPHATE 10 MG/ML IJ SOLN
INTRAMUSCULAR | Status: AC
  Filled 2021-12-08: qty 1

## 2021-12-08 MED ORDER — LIDOCAINE HCL (PF) 2 % IJ SOLN
INTRAMUSCULAR | Status: AC
  Filled 2021-12-08: qty 5

## 2021-12-08 MED ORDER — LORAZEPAM 0.5 MG PO TABS: 0.5000 mg | ORAL_TABLET | Freq: Every evening | ORAL | Status: DC | PRN

## 2021-12-08 MED ORDER — ONDANSETRON HCL 4 MG/2ML IJ SOLN
INTRAMUSCULAR | Status: AC
  Filled 2021-12-08: qty 2

## 2021-12-08 MED ORDER — ACETAMINOPHEN 160 MG/5ML PO SOLN: 325.0000 mg | Freq: Once | ORAL | Status: DC | PRN

## 2021-12-08 MED ORDER — BUPIVACAINE-EPINEPHRINE (PF) 0.25% -1:200000 IJ SOLN
INTRAMUSCULAR | Status: AC
  Filled 2021-12-08: qty 30

## 2021-12-08 MED ORDER — ASPIRIN 81 MG PO TBEC: 81.0000 mg | DELAYED_RELEASE_TABLET | Freq: Two times a day (BID) | ORAL | 0 refills | Status: AC

## 2021-12-08 MED ORDER — PHENOL 1.4 % MT LIQD: 1.0000 | OROMUCOSAL | Status: DC | PRN

## 2021-12-08 MED ORDER — EPHEDRINE SULFATE-NACL 50-0.9 MG/10ML-% IV SOSY
PREFILLED_SYRINGE | INTRAVENOUS | Status: DC | PRN
  Administered 2021-12-08: 10 mg via INTRAVENOUS

## 2021-12-08 MED ORDER — ROPIVACAINE HCL 5 MG/ML IJ SOLN
INTRAMUSCULAR | Status: DC | PRN
  Administered 2021-12-08: 30 mL via PERINEURAL

## 2021-12-08 MED ORDER — METOCLOPRAMIDE HCL 5 MG/ML IJ SOLN: 5.0000 mg | Freq: Three times a day (TID) | INTRAMUSCULAR | Status: DC | PRN

## 2021-12-08 SURGICAL SUPPLY — 56 items
ATTUNE MED DOME PAT 38 KNEE (Knees) IMPLANT
ATTUNE PS FEM RT SZ 7 CEM KNEE (Femur) IMPLANT
ATTUNE PSRP INSR SZ7 6 KNEE (Insert) IMPLANT
BAG COUNTER SPONGE SURGICOUNT (BAG) ×1 IMPLANT
BAG DECANTER FOR FLEXI CONT (MISCELLANEOUS) ×1 IMPLANT
BAG ZIPLOCK 12X15 (MISCELLANEOUS) ×1 IMPLANT
BASE TIBIAL ROT PLAT SZ 7 KNEE (Knees) IMPLANT
BENZOIN TINCTURE PRP APPL 2/3 (GAUZE/BANDAGES/DRESSINGS) ×1 IMPLANT
BLADE SAGITTAL 25.0X1.19X90 (BLADE) ×1 IMPLANT
BLADE SAW SGTL 13X75X1.27 (BLADE) ×1 IMPLANT
BLADE SURG 15 STRL LF DISP TIS (BLADE) ×1 IMPLANT
BLADE SURG 15 STRL SS (BLADE) ×1
BLADE SURG SZ10 CARB STEEL (BLADE) ×2 IMPLANT
BNDG ELASTIC 6X15 VLCR STRL LF (GAUZE/BANDAGES/DRESSINGS) ×1 IMPLANT
BOWL SMART MIX CTS (DISPOSABLE) ×1 IMPLANT
CEMENT HV SMART SET (Cement) IMPLANT
CLSR STERI-STRIP ANTIMIC 1/2X4 (GAUZE/BANDAGES/DRESSINGS) ×2 IMPLANT
COVER SURGICAL LIGHT HANDLE (MISCELLANEOUS) ×1 IMPLANT
CUFF TOURN SGL QUICK 34 (TOURNIQUET CUFF) ×1
CUFF TRNQT CYL 34X4.125X (TOURNIQUET CUFF) ×1 IMPLANT
DRAPE INCISE IOBAN 66X45 STRL (DRAPES) ×1 IMPLANT
DRAPE U-SHAPE 47X51 STRL (DRAPES) ×1 IMPLANT
DRESSING AQUACEL AG SP 3.5X10 (GAUZE/BANDAGES/DRESSINGS) ×1 IMPLANT
DRSG AQUACEL AG SP 3.5X10 (GAUZE/BANDAGES/DRESSINGS) ×1
DURAPREP 26ML APPLICATOR (WOUND CARE) ×2 IMPLANT
ELECT REM PT RETURN 15FT ADLT (MISCELLANEOUS) ×1 IMPLANT
GLOVE BIOGEL PI IND STRL 8 (GLOVE) ×2 IMPLANT
GLOVE SURG ORTHO 8.0 STRL STRW (GLOVE) ×1 IMPLANT
GLOVE SURG POLYISO LF SZ7.5 (GLOVE) ×1 IMPLANT
GOWN STRL REUS W/ TWL XL LVL3 (GOWN DISPOSABLE) ×2 IMPLANT
GOWN STRL REUS W/TWL XL LVL3 (GOWN DISPOSABLE) ×2
HANDPIECE INTERPULSE COAX TIP (DISPOSABLE) ×1
HOLDER FOLEY CATH W/STRAP (MISCELLANEOUS) IMPLANT
HOOD PEEL AWAY FLYTE STAYCOOL (MISCELLANEOUS) ×1 IMPLANT
IMMOBILIZER KNEE 20 (SOFTGOODS) ×1
IMMOBILIZER KNEE 20 THIGH 36 (SOFTGOODS) ×1 IMPLANT
KIT TURNOVER KIT A (KITS) IMPLANT
MANIFOLD NEPTUNE II (INSTRUMENTS) ×1 IMPLANT
NEEDLE HYPO 22GX1.5 SAFETY (NEEDLE) ×2 IMPLANT
NS IRRIG 1000ML POUR BTL (IV SOLUTION) ×1 IMPLANT
PACK TOTAL KNEE CUSTOM (KITS) ×1 IMPLANT
PROTECTOR NERVE ULNAR (MISCELLANEOUS) ×1 IMPLANT
SET HNDPC FAN SPRY TIP SCT (DISPOSABLE) ×1 IMPLANT
SPIKE FLUID TRANSFER (MISCELLANEOUS) ×2 IMPLANT
SUT ETHIBOND NAB CT1 #1 30IN (SUTURE) ×2 IMPLANT
SUT MNCRL AB 3-0 PS2 18 (SUTURE) ×1 IMPLANT
SUT VIC AB 0 CT1 36 (SUTURE) ×1 IMPLANT
SUT VIC AB 2-0 CT1 27 (SUTURE) ×2
SUT VIC AB 2-0 CT1 TAPERPNT 27 (SUTURE) ×2 IMPLANT
SYR CONTROL 10ML LL (SYRINGE) ×3 IMPLANT
TIBIAL BASE ROT PLAT SZ 7 KNEE (Knees) ×1 IMPLANT
TOWEL OR 17X26 10 PK STRL BLUE (TOWEL DISPOSABLE) ×1 IMPLANT
TRAY FOLEY MTR SLVR 16FR STAT (SET/KITS/TRAYS/PACK) ×1 IMPLANT
TUBE SUCTION HIGH CAP CLEAR NV (SUCTIONS) ×1 IMPLANT
WATER STERILE IRR 1000ML POUR (IV SOLUTION) ×2 IMPLANT
WRAP KNEE MAXI GEL POST OP (GAUZE/BANDAGES/DRESSINGS) ×1 IMPLANT

## 2021-12-08 NOTE — Anesthesia Procedure Notes (Signed)
Anesthesia Regional Block: Adductor canal block   Pre-Anesthetic Checklist: , timeout performed,  Correct Patient, Correct Site, Correct Laterality,  Correct Procedure, Correct Position, site marked,  Risks and benefits discussed,  Surgical consent,  Pre-op evaluation,  At surgeon's request and post-op pain management  Laterality: Right  Prep: chloraprep       Needles:  Injection technique: Single-shot  Needle Type: Echogenic Stimulator Needle     Needle Length: 9cm  Needle Gauge: 21     Additional Needles:   Procedures:,,,, ultrasound used (permanent image in chart),,    Narrative:  Start time: 12/08/2021 7:00 AM End time: 12/08/2021 7:05 AM Injection made incrementally with aspirations every 5 mL.  Performed by: Personally  Anesthesiologist: Effie Berkshire, MD  Additional Notes: Patient tolerated the procedure well. Local anesthetic introduced in an incremental fashion under minimal resistance after negative aspirations. No paresthesias were elicited. After completion of the procedure, no acute issues were identified and patient continued to be monitored by RN.

## 2021-12-08 NOTE — Discharge Instructions (Signed)

## 2021-12-08 NOTE — Brief Op Note (Signed)
12/08/2021  10:09 AM  PATIENT:  Jeremiah Wright  81 y.o. male  PRE-OPERATIVE DIAGNOSIS:  OA RIGHT KNEE  POST-OPERATIVE DIAGNOSIS:  OA RIGHT KNEE  PROCEDURE:  Procedure(s): TOTAL KNEE ARTHROPLASTY (Right)  SURGEON:  Surgeon(s) and Role:    * Earlie Server, MD - Primary  PHYSICIAN ASSISTANT: Chriss Czar, PA-C  ASSISTANTS: OR staff x1   ANESTHESIA:   local, regional, and general  EBL:  minimal   BLOOD ADMINISTERED:none  DRAINS: none   LOCAL MEDICATIONS USED:  MARCAINE     SPECIMEN:  No Specimen  DISPOSITION OF SPECIMEN:  N/A  COUNTS:  YES  TOURNIQUET:   Total Tourniquet Time Documented: Thigh (Right) - 59 minutes Total: Thigh (Right) - 59 minutes   DICTATION: .Other Dictation: Dictation Number unknown  PLAN OF CARE: Admit for overnight observation  PATIENT DISPOSITION:  PACU - hemodynamically stable.   Delay start of Pharmacological VTE agent (>24hrs) due to surgical blood loss or risk of bleeding: yes

## 2021-12-08 NOTE — Progress Notes (Signed)
Orthopedic Tech Progress Note Patient Details:  Jeremiah Wright 09-07-1940 [id number]  CPM Right Knee CPM Right Knee: On Right Knee Flexion (Degrees): 90 Right Knee Extension (Degrees): 0  Post Interventions Patient Tolerated: Well Ortho Devices Type of Ortho Device: Bone foam zero knee Ortho Device/Splint Location: Right knee Ortho Device/Splint Interventions: Application   Post Interventions Patient Tolerated: Well  Alejandro Gamel E [name] 12/08/2021, 11:19 AM

## 2021-12-08 NOTE — Anesthesia Postprocedure Evaluation (Signed)
Anesthesia Post Note  Patient: Olivia J Schulenburg  Procedure(s) Performed: TOTAL KNEE ARTHROPLASTY (Right: Knee)     Patient location during evaluation: PACU Anesthesia Type: General Level of consciousness: awake and alert Pain management: pain level controlled Vital Signs Assessment: post-procedure vital signs reviewed and stable Respiratory status: spontaneous breathing, nonlabored ventilation, respiratory function stable and patient connected to nasal cannula oxygen Cardiovascular status: blood pressure returned to baseline and stable Postop Assessment: no apparent nausea or vomiting Anesthetic complications: no   No notable events documented.  Last Vitals:  Vitals:   12/08/21 1410 12/08/21 1609  BP: (!) 152/104 139/80  Pulse: 80 76  Resp: 18 18  Temp: 36.8 C (!) 36.4 C  SpO2: 94% 97%    Last Pain:  Vitals:   12/08/21 1609  TempSrc: Oral  PainSc:                  Effie Berkshire

## 2021-12-08 NOTE — Interval H&P Note (Signed)
History and Physical Interval Note:  12/08/2021 7:40 AM  Jeremiah Wright  has presented today for surgery, with the diagnosis of OA RIGHT KNEE.  The various methods of treatment have been discussed with the patient and family. After consideration of risks, benefits and other options for treatment, the patient has consented to  Procedure(s): TOTAL KNEE ARTHROPLASTY (Right) as a surgical intervention.  The patient's history has been reviewed, patient examined, no change in status, stable for surgery.  I have reviewed the patient's chart and labs.  Questions were answered to the patient's satisfaction.     Yvette Rack

## 2021-12-08 NOTE — Anesthesia Procedure Notes (Signed)
Procedure Name: Intubation Date/Time: 12/08/2021 7:53 AM  Performed by: Niel Hummer, CRNAPre-anesthesia Checklist: Patient identified, Emergency Drugs available, Suction available and Patient being monitored Patient Re-evaluated:Patient Re-evaluated prior to induction Oxygen Delivery Method: Circle system utilized Preoxygenation: Pre-oxygenation with 100% oxygen Induction Type: IV induction Ventilation: Mask ventilation without difficulty Laryngoscope Size: Mac and 4 Grade View: Grade I Tube type: Oral Tube size: 7.5 mm Number of attempts: 1 Airway Equipment and Method: Stylet Placement Confirmation: ETT inserted through vocal cords under direct vision, positive ETCO2 and breath sounds checked- equal and bilateral Secured at: 22 cm Tube secured with: Tape Dental Injury: Teeth and Oropharynx as per pre-operative assessment

## 2021-12-08 NOTE — Transfer of Care (Signed)
Immediate Anesthesia Transfer of Care Note  Patient: Jeremiah Wright  Procedure(s) Performed: TOTAL KNEE ARTHROPLASTY (Right: Knee)  Patient Location: PACU  Anesthesia Type:General  Level of Consciousness: awake, alert  and oriented  Airway & Oxygen Therapy: Patient Spontanous Breathing and Patient connected to face mask oxygen  Post-op Assessment: Report given to RN, Post -op Vital signs reviewed and stable and Patient moving all extremities X 4  Post vital signs: Reviewed and stable  Last Vitals:  Vitals Value Taken Time  BP 173/110 12/08/21 1010  Temp    Pulse 81 12/08/21 1011  Resp 23 12/08/21 1011  SpO2 95 % 12/08/21 1011  Vitals shown include unvalidated device data.  Last Pain:  Vitals:   12/08/21 0610  TempSrc: Oral         Complications: No notable events documented.

## 2021-12-08 NOTE — Op Note (Signed)
NAME: Jeremiah Wright, Jeremiah Wright. MEDICAL RECORD NO: [id number] ACCOUNT NO: [id number] DATE OF BIRTH: 08-Jan-1941 FACILITY: [facility] LOCATION: [facility] PHYSICIAN: W D. Valeta Harms., MD  Operative Report   DATE OF PROCEDURE: 12/08/2021  PREOPERATIVE DIAGNOSIS:  Severe osteoarthritis, right knee.  POSTOPERATIVE DIAGNOSIS:  Severe osteoarthritis, right knee.  PROCEDURE:  Right total knee replacement (DePuy cemented Attune knee, size 7 femur, tibia, 38 mm all poly patella with 6 mm thick tibial bearing).  SURGEON:  W D. Valeta Harms., MD.  ASSISTANTMarjo Bicker, PA.  ANESTHESIA:  General with block.  TOURNIQUET TIME:  58 minutes.  DESCRIPTION OF PROCEDURE:  Supine position.  Straight skin incision with a medial parapatellar approach to the knee made.  We did a 5 degree valgus cut 10 mm in resection depth on the femur and cut about 3 mm below the most diseased medial compartment  with the extension gap, being measured at 6 mm.  All in one cutting block was placed after sizing the femur to be a size 7 and placing the block in appropriate degree of external rotation.  We accomplished the anterior, posterior and chamfer cuts.   Complete release of the PCL and removal of posterior osteophytes as well.  The capsular and subcutaneous tissues were injected with a mixture of Marcaine and Exparel.  Tibia was sized to be a size #7 as well with the keel cut for the tibia.  Flexion gap  did equal to the extension gap at 6 mm.  Relatively thin patella, resecting 7-1/2 mm of patella.  Placement of the 38 mm all poly trial.  All parameters with the trials then were deemed to be acceptable regarding range of motion, balance and stability.   Cement was prepared on the back table, inserted the cement in the doughy state tibia followed by femur, patella with the trial bearing.  Cement was allowed to harden.  Trial bearing was removed.  Small bits of cement were removed from the knee.   Tourniquet was released under direct vision, no  excessive bleeding was noted.  Small bleeders were coagulated.  The final bearing was placed and all parameters deemed to be acceptable.  Closure was affected with #1 Ethibond, 2-0 Vicryl and Monocryl in  the skin.  Taken to recovery room in stable condition.   PUS D: 12/08/2021 9:37:14 am T: 12/08/2021 11:10:00 am  JOB: [id number]/ [id number]

## 2021-12-08 NOTE — Evaluation (Signed)
Physical Therapy Evaluation Patient Details Name: Jeremiah Wright MRN: [id number] DOB: 12-Jun-1940 Today's Date: 12/08/2021  History of Present Illness  Pt is an 81yo male presenting s/p R-TKA on 12/08/21. PMH: hx of bladder cancer, HTN, L-TKA 2019.`  Clinical Impression  Jeremiah Wright is a 80 y.o. male POD 0 s/p R-TKA. Patient reports modified independence using SPC for mobility at baseline. Patient is now limited by functional impairments (see PT problem list below) and requires supervision for bed mobility and min guard for transfers. Patient was able to ambulate 25 feet with RW and min guard level of assist. Patient instructed in exercise to facilitate ROM and circulation to manage edema. Provided incentive spirometer and with Vcs pt able to achieve 1717m. Patient will benefit from continued skilled PT interventions to address impairments and progress towards PLOF. Acute PT will follow to progress mobility and stair training in preparation for safe discharge home.       Recommendations for follow up therapy are one component of a multi-disciplinary discharge planning process, led by the attending physician.  Recommendations may be updated based on patient status, additional functional criteria and insurance authorization.  Follow Up Recommendations Follow physician's recommendations for discharge plan and follow up therapies      Assistance Recommended at Discharge Frequent or constant Supervision/Assistance  Patient can return home with the following  A little help with walking and/or transfers;A little help with bathing/dressing/bathroom;Assistance with cooking/housework;Help with stairs or ramp for entrance;Assist for transportation    Equipment Recommendations None recommended by PT  Recommendations for Other Services       Functional Status Assessment Patient has had a recent decline in their functional status and demonstrates the ability to make significant improvements in function in  a reasonable and predictable amount of time.     Precautions / Restrictions Precautions Precautions: Fall;Knee Precaution Booklet Issued: No Precaution Comments: no pillow under the knee Required Braces or Orthoses: Knee Immobilizer - Right Knee Immobilizer - Right: On when out of bed or walking;Discontinue post op day 2 Restrictions Weight Bearing Restrictions: No Other Position/Activity Restrictions: wbat      Mobility  Bed Mobility Overal bed mobility: Needs Assistance Bed Mobility: Supine to Sit     Supine to sit: Supervision     General bed mobility comments: For safety only    Transfers Overall transfer level: Needs assistance Equipment used: Rolling walker (2 wheels) Transfers: Sit to/from Stand Sit to Stand: Min guard           General transfer comment: For safety only, VCs for powering up off bed    Ambulation/Gait Ambulation/Gait assistance: Min guard, +2 safety/equipment Gait Distance (Feet): 25 Feet Assistive device: Rolling walker (2 wheels) Gait Pattern/deviations: Step-to pattern Gait velocity: decreased     General Gait Details: Pt ambulated with RW and min guard, +2 for recliner follow, no physical assist required or overt LOB noted  Stairs            Wheelchair Mobility    Modified Rankin (Stroke Patients Only)       Balance Overall balance assessment: Needs assistance Sitting-balance support: No upper extremity supported, Feet supported Sitting balance-Leahy Scale: Good     Standing balance support: Reliant on assistive device for balance, During functional activity, Bilateral upper extremity supported Standing balance-Leahy Scale: Poor                               Pertinent  Vitals/Pain Pain Assessment Pain Assessment: 0-10 Pain Score: 7  Pain Location: right knee Pain Descriptors / Indicators: Operative site guarding Pain Intervention(s): Monitored during session, Limited activity within patient's  tolerance, Repositioned, Ice applied    Home Living Family/patient expects to be discharged to:: Private residence Living Arrangements: Alone Available Help at Discharge: Family;Available 24 hours/day Type of Home: House Home Access: Stairs to enter Entrance Stairs-Rails: Left Entrance Stairs-Number of Steps: 3   Home Layout: Able to live on main level with bedroom/bathroom;Two level Home Equipment: Conservation officer, nature (2 wheels);Cane - single point      Prior Function Prior Level of Function : Independent/Modified Independent             Mobility Comments: SPC ADLs Comments: IND     Hand Dominance   Dominant Hand: Right    Extremity/Trunk Assessment   Upper Extremity Assessment Upper Extremity Assessment: Overall WFL for tasks assessed    Lower Extremity Assessment Lower Extremity Assessment: RLE deficits/detail;LLE deficits/detail RLE Deficits / Details: MMT ank DF/PF 5/5, no extensor lag noted RLE Sensation: WNL LLE Deficits / Details: MMT ank DF/PF 5/5 LLE Sensation: WNL    Cervical / Trunk Assessment Cervical / Trunk Assessment: Kyphotic  Communication   Communication: No difficulties  Cognition Arousal/Alertness: Awake/alert Behavior During Therapy: WFL for tasks assessed/performed Overall Cognitive Status: Within Functional Limits for tasks assessed                                          General Comments      Exercises Total Joint Exercises Ankle Circles/Pumps: AROM, Both, 10 reps   Assessment/Plan    PT Assessment Patient needs continued PT services  PT Problem List Decreased strength;Decreased range of motion;Decreased activity tolerance;Decreased balance;Decreased mobility;Decreased coordination;Pain       PT Treatment Interventions DME instruction;Gait training;Stair training;Functional mobility training;Therapeutic activities;Therapeutic exercise;Balance training;Neuromuscular re-education;Patient/family education    PT  Goals (Current goals can be found in the Care Plan section)  Acute Rehab PT Goals Patient Stated Goal: To go home PT Goal Formulation: With patient Time For Goal Achievement: 12/15/21 Potential to Achieve Goals: Good    Frequency 7X/week     Co-evaluation               AM-PAC PT "6 Clicks" Mobility  Outcome Measure Help needed turning from your back to your side while in a flat bed without using bedrails?: None Help needed moving from lying on your back to sitting on the side of a flat bed without using bedrails?: A Little Help needed moving to and from a bed to a chair (including a wheelchair)?: A Little Help needed standing up from a chair using your arms (e.g., wheelchair or bedside chair)?: A Little Help needed to walk in hospital room?: A Little Help needed climbing 3-5 steps with a railing? : A Little 6 Click Score: 19    End of Session Equipment Utilized During Treatment: Gait belt;Right knee immobilizer Activity Tolerance: Patient tolerated treatment well;No increased pain Patient left: in chair;with call bell/phone within reach;with chair alarm set Nurse Communication: Mobility status PT Visit Diagnosis: Pain;Difficulty in walking, not elsewhere classified (R26.2) Pain - Right/Left: Right Pain - part of body: Knee    Time: 7858-8502 PT Time Calculation (min) (ACUTE ONLY): 21 min   Charges:   PT Evaluation $PT Eval Low Complexity: 1 Low  Coolidge Breeze, PT, DPT [facility] Office: [phone] Weekend pager: [phone]  Coolidge Breeze 12/08/2021, 5:31 PM

## 2021-12-09 DIAGNOSIS — M1711 Unilateral primary osteoarthritis, right knee: Secondary | ICD-10-CM | POA: Diagnosis not present

## 2021-12-09 NOTE — Progress Notes (Signed)
     Subjective:  Patient reports pain as well controlled.  Did well with therapy yesterday.  Eager to go home today.  Already up to recliner for breakfast this morning.  Denies distal numbness and tingling.  Objective:   VITALS:   Vitals:   12/08/21 1742 12/08/21 2049 12/09/21 0134 12/09/21 0451  BP: (!) 159/86 (!) 157/78 126/78 (!) 144/88  Pulse: 75 97 93 90  Resp: '18 18 18 18  '$ Temp: 98.2 F (36.8 C) 97.6 F (36.4 C) 97.9 F (36.6 C) 98.1 F (36.7 C)  TempSrc: Oral Oral Oral Oral  SpO2: 96% 96% 96% 96%  Weight:      Height:        Sensation intact distally Intact pulses distally Dorsiflexion/Plantar flexion intact Incision: dressing C/D/I Compartment soft   Lab Results  Component Value Date   WBC 7.5 11/27/2021   HGB 13.5 11/27/2021   HCT 41.1 11/27/2021   MCV 98.8 11/27/2021   PLT 281 11/27/2021   BMET    Component Value Date/Time   NA 136 11/27/2021 1430   K 4.8 11/27/2021 1430   CL 110 11/27/2021 1430   CO2 21 (L) 11/27/2021 1430   GLUCOSE 93 11/27/2021 1430   BUN 29 (H) 11/27/2021 1430   CREATININE 1.87 (H) 11/27/2021 1430   CALCIUM 8.9 11/27/2021 1430   GFRNONAA 36 (L) 11/27/2021 1430     Assessment/Plan: 1 Day Post-Op   Principal Problem:   Primary localized osteoarthritis of right knee Status post right TKA with Dr. French Ana 12/08/2021  Post op recs: WB: WBAT Abx: ancef x23 hours post op Dressing: keep intact until follow up, change PRN if soiled or saturated. DVT prophylaxis: ASA 81BID Follow up:   with Dr. French Ana at [facility].  Address: [address] [place], [place], [place] [address]  Office Phone: [phone]     Willaim Sheng 12/09/2021, 8:07 AM   Charlies Constable, MD  Contact information:   [phone] 7am-5pm epic message Dr. Zachery Dakins, or call office for patient follow up: [phone] After hours and holidays please check [email or web address] for group call information for [facility]

## 2021-12-09 NOTE — Plan of Care (Signed)
Patient ID: Jeremiah Wright, male   DOB: 1940-03-16, 81 y.o.   MRN: [id number]  Problem: Education: Goal: Knowledge of the prescribed therapeutic regimen will improve Outcome: Adequate for Discharge Goal: Individualized Educational Video(s) Outcome: Adequate for Discharge   Problem: Activity: Goal: Ability to avoid complications of mobility impairment will improve Outcome: Adequate for Discharge Goal: Range of joint motion will improve Outcome: Adequate for Discharge   Problem: Clinical Measurements: Goal: Postoperative complications will be avoided or minimized Outcome: Adequate for Discharge   Problem: Pain Management: Goal: Pain level will decrease with appropriate interventions Outcome: Adequate for Discharge   Problem: Skin Integrity: Goal: Will show signs of wound healing Outcome: Adequate for Discharge   Problem: Education: Goal: Knowledge of General Education information will improve Description: Including pain rating scale, medication(s)/side effects and non-pharmacologic comfort measures Outcome: Adequate for Discharge   Problem: Health Behavior/Discharge Planning: Goal: Ability to manage health-related needs will improve Outcome: Adequate for Discharge   Problem: Clinical Measurements: Goal: Ability to maintain clinical measurements within normal limits will improve Outcome: Adequate for Discharge Goal: Will remain free from infection Outcome: Adequate for Discharge Goal: Diagnostic test results will improve Outcome: Adequate for Discharge Goal: Respiratory complications will improve Outcome: Adequate for Discharge Goal: Cardiovascular complication will be avoided Outcome: Adequate for Discharge   Problem: Activity: Goal: Risk for activity intolerance will decrease Outcome: Adequate for Discharge   Problem: Nutrition: Goal: Adequate nutrition will be maintained Outcome: Adequate for Discharge   Problem: Coping: Goal: Level of anxiety will  decrease Outcome: Adequate for Discharge   Problem: Elimination: Goal: Will not experience complications related to bowel motility Outcome: Adequate for Discharge Goal: Will not experience complications related to urinary retention Outcome: Adequate for Discharge   Problem: Pain Managment: Goal: General experience of comfort will improve Outcome: Adequate for Discharge   Problem: Safety: Goal: Ability to remain free from injury will improve Outcome: Adequate for Discharge   Problem: Skin Integrity: Goal: Risk for impaired skin integrity will decrease Outcome: Adequate for Discharge     Haydee Salter, RN

## 2021-12-09 NOTE — Progress Notes (Signed)
Physical Therapy Treatment Patient Details Name: Jeremiah Wright MRN: [id number] DOB: 07/21/1940 Today's Date: 12/09/2021   History of Present Illness Pt is an 81yo male presenting s/p R-TKA on 12/08/21. PMH: hx of bladder cancer, HTN, L-TKA 2019.`    PT Comments    Pt seen POD1 received in recliner eager to mobilize. Required min guard for transfers and for ambulation in hallway with RW 171f. Pt completed stair training with min guard, verbalized safe guarding position for family. Provided HEP and pt completed exercises with safe form and minimal cuing. All education completed and pt has no further questions. Pt has met mobility goals for safe discharge home, PT is signing off, should needs change please reconsult. Thank you for this referral.    Recommendations for follow up therapy are one component of a multi-disciplinary discharge planning process, led by the attending physician.  Recommendations may be updated based on patient status, additional functional criteria and insurance authorization.  Follow Up Recommendations  Follow physician's recommendations for discharge plan and follow up therapies     Assistance Recommended at Discharge Frequent or constant Supervision/Assistance  Patient can return home with the following A little help with walking and/or transfers;A little help with bathing/dressing/bathroom;Assistance with cooking/housework;Help with stairs or ramp for entrance;Assist for transportation   Equipment Recommendations  None recommended by PT    Recommendations for Other Services       Precautions / Restrictions Precautions Precautions: Fall;Knee Precaution Booklet Issued: No Precaution Comments: no pillow under the knee Required Braces or Orthoses: Knee Immobilizer - Right Knee Immobilizer - Right: On when out of bed or walking;Discontinue post op day 2 Restrictions Weight Bearing Restrictions: No RLE Weight Bearing: Weight bearing as tolerated Other  Position/Activity Restrictions: wbat     Mobility  Bed Mobility               General bed mobility comments: Pt in chair at entry and exit    Transfers Overall transfer level: Needs assistance Equipment used: Rolling walker (2 wheels) Transfers: Sit to/from Stand Sit to Stand: Min guard           General transfer comment: For safety only    Ambulation/Gait Ambulation/Gait assistance: Min guard Gait Distance (Feet): 100 Feet Assistive device: Rolling walker (2 wheels) Gait Pattern/deviations: Step-to pattern Gait velocity: decreased     General Gait Details: Pt ambulated with RW and min guard, no physical assist required or overt LOB noted   Stairs Stairs: Yes Stairs assistance: Min guard Stair Management: One rail Left, Step to pattern, Forwards, With cane Number of Stairs: 2 General stair comments: Pt completed stair training with min guard, no overt LOB noted or physical assist required, VCs for sequencing.   Wheelchair Mobility    Modified Rankin (Stroke Patients Only)       Balance Overall balance assessment: Needs assistance Sitting-balance support: No upper extremity supported, Feet supported Sitting balance-Leahy Scale: Good     Standing balance support: Reliant on assistive device for balance, During functional activity, Bilateral upper extremity supported Standing balance-Leahy Scale: Poor                              Cognition Arousal/Alertness: Awake/alert Behavior During Therapy: WFL for tasks assessed/performed Overall Cognitive Status: Within Functional Limits for tasks assessed  Exercises Total Joint Exercises Ankle Circles/Pumps: AROM, Both, 10 reps Quad Sets: AROM, Right, 10 reps Short Arc Quad: AROM, Right, 10 reps Heel Slides: AROM, Right, 10 reps Hip ABduction/ADduction: AROM, Right, 10 reps Straight Leg Raises: AROM, Right, 10 reps Long Arc Quad:  Other (comment) (attemtped but discontinued) Goniometric ROM: 0-50deg by gross visual approximation    General Comments General comments (skin integrity, edema, etc.): Daughter and son in law present      Pertinent Vitals/Pain Pain Assessment Pain Assessment: 0-10 Pain Score: 5  Faces Pain Scale: Hurts little more Pain Location: right knee Pain Descriptors / Indicators: Operative site guarding Pain Intervention(s): Limited activity within patient's tolerance, Monitored during session, Repositioned, Ice applied    Home Living Family/patient expects to be discharged to:: Private residence Living Arrangements: Alone Available Help at Discharge: Family;Available 24 hours/day Type of Home: House Home Access: Stairs to enter Entrance Stairs-Rails: Left Entrance Stairs-Number of Steps: 3   Home Layout: Able to live on main level with bedroom/bathroom;Two level Home Equipment: Conservation officer, nature (2 wheels);Cane - single point;Shower seat;Hand held shower head;Adaptive equipment      Prior Function            PT Goals (current goals can now be found in the care plan section) Acute Rehab PT Goals Patient Stated Goal: To go home PT Goal Formulation: With patient Time For Goal Achievement: 12/15/21 Potential to Achieve Goals: Good Progress towards PT goals: Goals met/education completed, patient discharged from PT    Frequency    7X/week      PT Plan Current plan remains appropriate    Co-evaluation              AM-PAC PT "6 Clicks" Mobility   Outcome Measure  Help needed turning from your back to your side while in a flat bed without using bedrails?: None Help needed moving from lying on your back to sitting on the side of a flat bed without using bedrails?: A Little Help needed moving to and from a bed to a chair (including a wheelchair)?: A Little Help needed standing up from a chair using your arms (e.g., wheelchair or bedside chair)?: A Little Help needed to  walk in hospital room?: A Little Help needed climbing 3-5 steps with a railing? : A Little 6 Click Score: 19    End of Session Equipment Utilized During Treatment: Gait belt;Right knee immobilizer Activity Tolerance: Patient tolerated treatment well;No increased pain Patient left: in chair;with call bell/phone within reach;with chair alarm set Nurse Communication: Mobility status PT Visit Diagnosis: Pain;Difficulty in walking, not elsewhere classified (R26.2) Pain - Right/Left: Right Pain - part of body: Knee     Time: 3329-5188 PT Time Calculation (min) (ACUTE ONLY): 25 min  Charges:  $Gait Training: 8-22 mins $Therapeutic Exercise: 8-22 mins                     Coolidge Breeze, PT, DPT [facility] Office: [phone] Weekend pager: [phone]   Coolidge Breeze 12/09/2021, 11:56 AM

## 2021-12-09 NOTE — Evaluation (Signed)
Occupational Therapy Evaluation and Discharge Patient Details Name: Jeremiah Wright MRN: [id number] DOB: August 07, 1940 Today's Date: 12/09/2021   History of Present Illness Pt is an 81yo male presenting s/p R-TKA on 12/08/21. PMH: hx of bladder cancer, HTN, L-TKA 2019.`   Clinical Impression   Pt was ambulating with a cane, driving and independent in ADL and IADLs prior to admission. His wife passed away in 2022/04/28, his children will be caring for him when he returns home. Pt presents with post operative pain and impaired balance. Educated pt in compensatory strategies and use of AE for LB bathing and dressing and in tub transfer avoiding stepping over edge of tub and using the tub seat he already owns. Instructed in benefits of walker bag or basket to safely transport items with RW. Pt verbalized and/or demonstrated understanding. No further OT needs.      Recommendations for follow up therapy are one component of a multi-disciplinary discharge planning process, led by the attending physician.  Recommendations may be updated based on patient status, additional functional criteria and insurance authorization.   Follow Up Recommendations  No OT follow up    Assistance Recommended at Discharge Intermittent Supervision/Assistance  Patient can return home with the following A little help with walking and/or transfers;A little help with bathing/dressing/bathroom;Assistance with cooking/housework;Assist for transportation;Help with stairs or ramp for entrance    Functional Status Assessment  Patient has had a recent decline in their functional status and demonstrates the ability to make significant improvements in function in a reasonable and predictable amount of time.  Equipment Recommendations  None recommended by OT    Recommendations for Other Services       Precautions / Restrictions Precautions Precautions: Fall;Knee Precaution Comments: no pillow under the knee Required Braces or  Orthoses: Knee Immobilizer - Right Knee Immobilizer - Right: On when out of bed or walking;Discontinue post op day 2 Restrictions Weight Bearing Restrictions: No RLE Weight Bearing: Weight bearing as tolerated      Mobility Bed Mobility               General bed mobility comments: pt received in chair    Transfers Overall transfer level: Needs assistance Equipment used: Rolling walker (2 wheels)   Sit to Stand: Min guard                  Balance Overall balance assessment: Needs assistance Sitting-balance support: No upper extremity supported, Feet supported Sitting balance-Leahy Scale: Good       Standing balance-Leahy Scale: Poor                             ADL either performed or assessed with clinical judgement   ADL Overall ADL's : Needs assistance/impaired Eating/Feeding: Independent;Sitting   Grooming: Supervision/safety;Standing;Wash/dry hands   Upper Body Bathing: Set up;Sitting   Lower Body Bathing: Minimal assistance;Sit to/from stand Lower Body Bathing Details (indicate cue type and reason): educated in use of long handled bath sponge and reacher Upper Body Dressing : Set up;Sitting Upper Body Dressing Details (indicate cue type and reason): bathrobe Lower Body Dressing: Minimal assistance;Sit to/from stand Lower Body Dressing Details (indicate cue type and reason): instructed to dress R LE first and in use of reacher and sock aide Toilet Transfer: Min guard;Rolling walker (2 wheels);Ambulation         Tub/Shower Transfer Details (indicate cue type and reason): Verbally instructed in tub transfer sitting on shower seat and swinging  legs over edge of tub with assistance of children. Functional mobility during ADLs: Min guard;Rolling walker (2 wheels)       Vision Ability to See in Adequate Light: 0 Adequate Patient Visual Report: No change from baseline       Perception     Praxis      Pertinent Vitals/Pain Pain  Assessment Pain Assessment: Faces Faces Pain Scale: Hurts little more Pain Location: right knee Pain Descriptors / Indicators: Operative site guarding Pain Intervention(s): Monitored during session, Repositioned     Hand Dominance Right   Extremity/Trunk Assessment Upper Extremity Assessment Upper Extremity Assessment: Overall WFL for tasks assessed   Lower Extremity Assessment Lower Extremity Assessment: Defer to PT evaluation   Cervical / Trunk Assessment Cervical / Trunk Assessment: Kyphotic   Communication Communication Communication: No difficulties   Cognition Arousal/Alertness: Awake/alert Behavior During Therapy: WFL for tasks assessed/performed Overall Cognitive Status: Within Functional Limits for tasks assessed                                       General Comments       Exercises     Shoulder Instructions      Home Living Family/patient expects to be discharged to:: Private residence Living Arrangements: Alone Available Help at Discharge: Family;Available 24 hours/day Type of Home: House Home Access: Stairs to enter [employer] of Steps: 3 Entrance Stairs-Rails: Left Home Layout: Able to live on main level with bedroom/bathroom;Two level     Bathroom Shower/Tub: Teacher, early years/pre: Handicapped height     Home Equipment: Conservation officer, nature (2 wheels);Cane - single point;Shower seat;Hand held shower head;Adaptive equipment Adaptive Equipment: Reacher        Prior Functioning/Environment Prior Level of Function : Independent/Modified Independent;Driving             Mobility Comments: SPC ADLs Comments: IND        OT Problem List: Impaired balance (sitting and/or standing);Decreased strength      OT Treatment/Interventions:      OT Goals(Current goals can be found in the care plan section) Acute Rehab OT Goals OT Goal Formulation: With patient  OT Frequency:      Co-evaluation               AM-PAC OT "6 Clicks" Daily Activity     Outcome Measure Help from another person eating meals?: None Help from another person taking care of personal grooming?: A Little Help from another person toileting, which includes using toliet, bedpan, or urinal?: A Little Help from another person bathing (including washing, rinsing, drying)?: A Little Help from another person to put on and taking off regular upper body clothing?: A Little Help from another person to put on and taking off regular lower body clothing?: A Little 6 Click Score: 19   End of Session Equipment Utilized During Treatment: Rolling walker (2 wheels);Right knee immobilizer  Activity Tolerance: Patient tolerated treatment well Patient left: in chair;with call bell/phone within reach;with chair alarm set  OT Visit Diagnosis: Unsteadiness on feet (R26.81);Other abnormalities of gait and mobility (R26.89);Pain Pain - Right/Left: Right Pain - part of body: Knee                Time: 2725-3664 OT Time Calculation (min): 22 min Charges:  OT General Charges $OT Visit: 1 Visit OT Evaluation $OT Eval Moderate Complexity: 1 Mod  Cleta Alberts, OTR/L Acute  Rehabilitation Services Office: [phone]   Malka So 12/09/2021, 8:43 AM

## 2021-12-09 NOTE — Discharge Summary (Signed)
Physician Discharge Summary  Patient ID: Jeremiah Wright MRN: [id number] DOB/AGE: [birth year] 81 y.o.  Admit date: 12/08/2021 Discharge date: 12/09/2021  Admission Diagnoses:  Primary localized osteoarthritis of right knee  Discharge Diagnoses:  Principal Problem:   Primary localized osteoarthritis of right knee   Past Medical History:  Diagnosis Date   Arthritis    SHOULDERS AND KNEES - ARTHRITIS AND PAIN   Bladder cancer ([place])    "[name]"   prostate   Heart murmur    PT TOLD SLIGHT HEART MURMUR - DOESN'T CAUSE ANY PROBLEMS   History of diverticulitis of colon    Hypertension    Insomnia     Surgeries: Procedure(s): TOTAL KNEE ARTHROPLASTY on 12/08/2021   Consultants (if any):   Discharged Condition: Improved  Hospital Course: Jeremiah Wright is an 81 y.o. male who was admitted 12/08/2021 with a diagnosis of Primary localized osteoarthritis of right knee and went to the operating room on 12/08/2021 and underwent the above named procedures.    He was given perioperative antibiotics:  Anti-infectives (From admission, onward)    Start     Dose/Rate Route Frequency Ordered Stop   12/08/21 1445  ceFAZolin (ANCEF) IVPB 2g/100 mL premix        2 g 200 mL/hr over 30 Minutes Intravenous Every 6 hours 12/08/21 1347 12/08/21 2030   12/08/21 0600  ceFAZolin (ANCEF) IVPB 2g/100 mL premix        2 g 200 mL/hr over 30 Minutes Intravenous On call to O.R. 12/08/21 [id number] 12/08/21 0737     .  He was given sequential compression devices, early ambulation, and aspirin for DVT prophylaxis.  He benefited maximally from the hospital stay and there were no complications.    Recent vital signs:  Vitals:   12/09/21 0134 12/09/21 0451  BP: 126/78 (!) 144/88  Pulse: 93 90  Resp: 18 18  Temp: 97.9 F (36.6 C) 98.1 F (36.7 C)  SpO2: 96% 96%    Recent laboratory studies:  Lab Results  Component Value Date   HGB 13.5 11/27/2021   HGB 11.8 (L) 01/14/2018   HGB 13.4 01/07/2018    Lab Results  Component Value Date   WBC 7.5 11/27/2021   PLT 281 11/27/2021   Lab Results  Component Value Date   INR 1.06 11/20/2011   Lab Results  Component Value Date   NA 136 11/27/2021   K 4.8 11/27/2021   CL 110 11/27/2021   CO2 21 (L) 11/27/2021   BUN 29 (H) 11/27/2021   CREATININE 1.87 (H) 11/27/2021   GLUCOSE 93 11/27/2021    Discharge Medications:   Allergies as of 12/09/2021       Reactions   Prednisone Other (See Comments)   Makes the patient feel edgy        Medication List     TAKE these medications    allopurinol 100 MG tablet Commonly known as: ZYLOPRIM Take 100 mg by mouth daily.   aspirin EC 81 MG tablet Take 1 tablet (81 mg total) by mouth 2 (two) times daily. Swallow whole. What changed: when to take this   carvedilol 12.5 MG tablet Commonly known as: COREG Take 12.5 mg by mouth at bedtime.   furosemide 20 MG tablet Commonly known as: LASIX Take 10 mg by mouth daily as needed (swelling).   hydrocortisone cream 1 % Apply 1 application topically 2 (two) times daily as needed for itching.   leflunomide 20 MG tablet Commonly known as: ARAVA  Take 20 mg by mouth at bedtime.   LORazepam 0.5 MG tablet Commonly known as: ATIVAN Take 0.5 mg by mouth daily as needed for anxiety.   oxyCODONE 5 MG immediate release tablet Commonly known as: Oxy IR/ROXICODONE Take one tab po q4-6hrs prn pain   traMADol 50 MG tablet Commonly known as: ULTRAM Take 50-100 mg by mouth every 6 (six) hours as needed for moderate pain.   valsartan 40 MG tablet Commonly known as: DIOVAN Take 40 mg by mouth in the morning.   zolpidem 10 MG tablet Commonly known as: AMBIEN Take 7.5-10 mg by mouth at bedtime.               Durable Medical Equipment  (From admission, onward)           Start     Ordered   12/08/21 1348  DME Walker rolling  Once       Question Answer Comment  Walker: With 5 Inch Wheels   Patient needs a walker to treat with  the following condition Primary localized osteoarthritis of right knee      12/08/21 1347            Diagnostic Studies: No results found.  Disposition: Discharge disposition: 01-Home or Self Care       Discharge Instructions     Call MD / Call 911   Complete by: As directed    If you experience chest pain or shortness of breath, CALL 911 and be transported to the hospital emergency room.  If you develope a fever above 101 F, pus (white drainage) or increased drainage or redness at the wound, or calf pain, call your surgeon's office.   Constipation Prevention   Complete by: As directed    Drink plenty of fluids.  Prune juice may be helpful.  You may use a stool softener, such as Colace (over the counter) 100 mg twice a day.  Use MiraLax (over the counter) for constipation as needed.   Diet - low sodium heart healthy   Complete by: As directed    Increase activity slowly as tolerated   Complete by: As directed    Post-operative opioid taper instructions:   Complete by: As directed    POST-OPERATIVE OPIOID TAPER INSTRUCTIONS: It is important to wean off of your opioid medication as soon as possible. If you do not need pain medication after your surgery it is ok to stop day one. Opioids include: Codeine, Hydrocodone(Norco, Vicodin), Oxycodone(Percocet, oxycontin) and hydromorphone amongst others.  Long term and even short term use of opiods can cause: Increased pain response Dependence Constipation Depression Respiratory depression And more.  Withdrawal symptoms can include Flu like symptoms Nausea, vomiting And more Techniques to manage these symptoms Hydrate well Eat regular healthy meals Stay active Use relaxation techniques(deep breathing, meditating, yoga) Do Not substitute Alcohol to help with tapering If you have been on opioids for less than two weeks and do not have pain than it is ok to stop all together.  Plan to wean off of opioids This plan should  start within one week post op of your joint replacement. Maintain the same interval or time between taking each dose and first decrease the dose.  Cut the total daily intake of opioids by one tablet each day Next start to increase the time between doses. The last dose that should be eliminated is the evening dose.           Follow-up Information  Earlie Server, MD. Schedule an appointment as soon as possible for a visit in 2 week(s).   Specialty: Orthopedic Surgery Contact information: [place] [address] [phone]                    Discharge Instructions      INSTRUCTIONS AFTER JOINT REPLACEMENT   Remove items at home which could result in a fall. This includes throw rugs or furniture in walking pathways ICE to the affected joint every three hours while awake for 30 minutes at a time, for at least the first 3-5 days, and then as needed for pain and swelling.  Continue to use ice for pain and swelling. You may notice swelling that will progress down to the foot and ankle.  This is normal after surgery.  Elevate your leg when you are not up walking on it.   Continue to use the breathing machine you got in the hospital (incentive spirometer) which will help keep your temperature down.  It is common for your temperature to cycle up and down following surgery, especially at night when you are not up moving around and exerting yourself.  The breathing machine keeps your lungs expanded and your temperature down.   DIET:  As you were doing prior to hospitalization, we recommend a well-balanced diet.  DRESSING / WOUND CARE / SHOWERING  Keep the surgical dressing until follow up.  The dressing is water proof, so you can shower without any extra covering.  IF THE DRESSING FALLS OFF or the wound gets wet inside, change the dressing with sterile gauze.  Please use good hand washing techniques before changing the dressing.  Do not use any  lotions or creams on the incision until instructed by your surgeon.    ACTIVITY  Increase activity slowly as tolerated, but follow the weight bearing instructions below.   No driving for 6 weeks or until further direction given by your physician.  You cannot drive while taking narcotics.  No lifting or carrying greater than 10 lbs. until further directed by your surgeon. Avoid periods of inactivity such as sitting longer than an hour when not asleep. This helps prevent blood clots.  You may return to work once you are authorized by your doctor.     WEIGHT BEARING   Weight bearing as tolerated with assist device (walker, cane, etc) as directed, use it as long as suggested by your surgeon or therapist, typically at least 4-6 weeks.   EXERCISES  Results after joint replacement surgery are often greatly improved when you follow the exercise, range of motion and muscle strengthening exercises prescribed by your doctor. Safety measures are also important to protect the joint from further injury. Any time any of these exercises cause you to have increased pain or swelling, decrease what you are doing until you are comfortable again and then slowly increase them. If you have problems or questions, call your caregiver or physical therapist for advice.   Rehabilitation is important following a joint replacement. After just a few days of immobilization, the muscles of the leg can become weakened and shrink (atrophy).  These exercises are designed to build up the tone and strength of the thigh and leg muscles and to improve motion. Often times heat used for twenty to thirty minutes before working out will loosen up your tissues and help with improving the range of motion but do not use heat for the first two weeks following surgery (sometimes  heat can increase post-operative swelling).   These exercises can be done on a training (exercise) mat, on the floor, on a table or on a bed. Use whatever works the  best and is most comfortable for you.    Use music or television while you are exercising so that the exercises are a pleasant break in your day. This will make your life better with the exercises acting as a break in your routine that you can look forward to.   Perform all exercises about fifteen times, three times per day or as directed.  You should exercise both the operative leg and the other leg as well.  Exercises include:   Quad Sets - Tighten up the muscle on the front of the thigh (Quad) and hold for 5-10 seconds.   Straight Leg Raises - With your knee straight (if you were given a brace, keep it on), lift the leg to 60 degrees, hold for 3 seconds, and slowly lower the leg.  Perform this exercise against resistance later as your leg gets stronger.  Leg Slides: Lying on your back, slowly slide your foot toward your buttocks, bending your knee up off the floor (only go as far as is comfortable). Then slowly slide your foot back down until your leg is flat on the floor again.  Angel Wings: Lying on your back spread your legs to the side as far apart as you can without causing discomfort.  Hamstring Strength:  Lying on your back, push your heel against the floor with your leg straight by tightening up the muscles of your buttocks.  Repeat, but this time bend your knee to a comfortable angle, and push your heel against the floor.  You may put a pillow under the heel to make it more comfortable if necessary.   A rehabilitation program following joint replacement surgery can speed recovery and prevent re-injury in the future due to weakened muscles. Contact your doctor or a physical therapist for more information on knee rehabilitation.    CONSTIPATION  Constipation is defined medically as fewer than three stools per week and severe constipation as less than one stool per week.  Even if you have a regular bowel pattern at home, your normal regimen is likely to be disrupted due to multiple reasons  following surgery.  Combination of anesthesia, postoperative narcotics, change in appetite and fluid intake all can affect your bowels.   YOU MUST use at least one of the following options; they are listed in order of increasing strength to get the job done.  They are all available over the counter, and you may need to use some, POSSIBLY even all of these options:    Drink plenty of fluids (prune juice may be helpful) and high fiber foods Colace 100 mg by mouth twice a day  Senokot for constipation as directed and as needed Dulcolax (bisacodyl), take with full glass of water  Miralax (polyethylene glycol) once or twice a day as needed.  If you have tried all these things and are unable to have a bowel movement in the first 3-4 days after surgery call either your surgeon or your primary doctor.    If you experience loose stools or diarrhea, hold the medications until you stool forms back up.  If your symptoms do not get better within 1 week or if they get worse, check with your doctor.  If you experience "the worst abdominal pain ever" or develop nausea or vomiting, please contact the office immediately  for further recommendations for treatment.   ITCHING:  If you experience itching with your medications, try taking only a single pain pill, or even half a pain pill at a time.  You can also use Benadryl over the counter for itching or also to help with sleep.   TED HOSE STOCKINGS:  Use stockings on both legs until for at least 2 weeks or as directed by physician office. They may be removed at night for sleeping.  MEDICATIONS:  See your medication summary on the "After Visit Summary" that nursing will review with you.  You may have some home medications which will be placed on hold until you complete the course of blood thinner medication.  It is important for you to complete the blood thinner medication as prescribed.  PRECAUTIONS:  If you experience chest pain or shortness of breath - call 911  immediately for transfer to the hospital emergency department.   If you develop a fever greater that 101 F, purulent drainage from wound, increased redness or drainage from wound, foul odor from the wound/dressing, or calf pain - CONTACT YOUR SURGEON.                                                   FOLLOW-UP APPOINTMENTS:  If you do not already have a post-op appointment, please call the office for an appointment to be seen by your surgeon.  Guidelines for how soon to be seen are listed in your "After Visit Summary", but are typically between 1-4 weeks after surgery.  OTHER INSTRUCTIONS:   Knee Replacement:  Do not place pillow under knee, focus on keeping the knee straight while resting. CPM instructions: 0-90 degrees, 2 hours in the morning, 2 hours in the afternoon, and 2 hours in the evening. Place foam block, curve side up under heel at all times except when in CPM or when walking.  DO NOT modify, tear, cut, or change the foam block in any way.  POST-OPERATIVE OPIOID TAPER INSTRUCTIONS: It is important to wean off of your opioid medication as soon as possible. If you do not need pain medication after your surgery it is ok to stop day one. Opioids include: Codeine, Hydrocodone(Norco, Vicodin), Oxycodone(Percocet, oxycontin) and hydromorphone amongst others.  Long term and even short term use of opiods can cause: Increased pain response Dependence Constipation Depression Respiratory depression And more.  Withdrawal symptoms can include Flu like symptoms Nausea, vomiting And more Techniques to manage these symptoms Hydrate well Eat regular healthy meals Stay active Use relaxation techniques(deep breathing, meditating, yoga) Do Not substitute Alcohol to help with tapering If you have been on opioids for less than two weeks and do not have pain than it is ok to stop all together.  Plan to wean off of opioids This plan should start within one week post op of your joint  replacement. Maintain the same interval or time between taking each dose and first decrease the dose.  Cut the total daily intake of opioids by one tablet each day Next start to increase the time between doses. The last dose that should be eliminated is the evening dose.   MAKE SURE YOU:  Understand these instructions.  Get help right away if you are not doing well or get worse.    Thank you for letting us be a part of your medical  care team.  It is a privilege we respect greatly.  We hope these instructions will help you stay on track for a fast and full recovery!         Signed: Emersen Carroll A Cayla Wiegand 12/09/2021, 8:09 AM

## 2021-12-11 ENCOUNTER — Encounter (HOSPITAL_COMMUNITY): Payer: Self-pay | Admitting: Orthopedic Surgery

## 2022-03-12 DIAGNOSIS — C670 Malignant neoplasm of trigone of bladder: Secondary | ICD-10-CM | POA: Diagnosis not present

## 2022-03-12 DIAGNOSIS — C61 Malignant neoplasm of prostate: Secondary | ICD-10-CM | POA: Diagnosis not present

## 2022-03-14 ENCOUNTER — Ambulatory Visit (HOSPITAL_COMMUNITY)
Admission: RE | Admit: 2022-03-14 | Discharge: 2022-03-14 | Disposition: A | Discharge status: Home / Self Care | Payer: PPO | Source: Ambulatory Visit | Attending: Urology | Admitting: Urology

## 2022-03-14 ENCOUNTER — Other Ambulatory Visit (HOSPITAL_COMMUNITY): Payer: Self-pay | Admitting: Urology

## 2022-03-14 DIAGNOSIS — C670 Malignant neoplasm of trigone of bladder: Secondary | ICD-10-CM | POA: Diagnosis not present

## 2022-03-14 DIAGNOSIS — N134 Hydroureter: Secondary | ICD-10-CM | POA: Diagnosis not present

## 2022-03-14 DIAGNOSIS — N1330 Unspecified hydronephrosis: Secondary | ICD-10-CM | POA: Diagnosis not present

## 2022-03-14 DIAGNOSIS — C679 Malignant neoplasm of bladder, unspecified: Secondary | ICD-10-CM | POA: Diagnosis not present

## 2022-03-14 DIAGNOSIS — I700 Atherosclerosis of aorta: Secondary | ICD-10-CM | POA: Diagnosis not present

## 2022-03-15 DIAGNOSIS — Z436 Encounter for attention to other artificial openings of urinary tract: Secondary | ICD-10-CM | POA: Diagnosis not present

## 2022-03-19 DIAGNOSIS — C61 Malignant neoplasm of prostate: Secondary | ICD-10-CM | POA: Diagnosis not present

## 2022-03-19 DIAGNOSIS — Z936 Other artificial openings of urinary tract status: Secondary | ICD-10-CM | POA: Diagnosis not present

## 2022-03-19 DIAGNOSIS — C670 Malignant neoplasm of trigone of bladder: Secondary | ICD-10-CM | POA: Diagnosis not present

## 2022-04-03 DIAGNOSIS — D649 Anemia, unspecified: Secondary | ICD-10-CM | POA: Diagnosis not present

## 2022-04-03 DIAGNOSIS — E7800 Pure hypercholesterolemia, unspecified: Secondary | ICD-10-CM | POA: Diagnosis not present

## 2022-04-03 DIAGNOSIS — I129 Hypertensive chronic kidney disease with stage 1 through stage 4 chronic kidney disease, or unspecified chronic kidney disease: Secondary | ICD-10-CM | POA: Diagnosis not present

## 2022-04-03 DIAGNOSIS — R7309 Other abnormal glucose: Secondary | ICD-10-CM | POA: Diagnosis not present

## 2022-04-03 DIAGNOSIS — N1832 Chronic kidney disease, stage 3b: Secondary | ICD-10-CM | POA: Diagnosis not present

## 2022-04-10 DIAGNOSIS — R5383 Other fatigue: Secondary | ICD-10-CM | POA: Diagnosis not present

## 2022-04-10 DIAGNOSIS — L4050 Arthropathic psoriasis, unspecified: Secondary | ICD-10-CM | POA: Diagnosis not present

## 2022-04-10 DIAGNOSIS — M539 Dorsopathy, unspecified: Secondary | ICD-10-CM | POA: Diagnosis not present

## 2022-04-10 DIAGNOSIS — D649 Anemia, unspecified: Secondary | ICD-10-CM | POA: Diagnosis not present

## 2022-04-10 DIAGNOSIS — R7309 Other abnormal glucose: Secondary | ICD-10-CM | POA: Diagnosis not present

## 2022-04-10 DIAGNOSIS — I129 Hypertensive chronic kidney disease with stage 1 through stage 4 chronic kidney disease, or unspecified chronic kidney disease: Secondary | ICD-10-CM | POA: Diagnosis not present

## 2022-04-10 DIAGNOSIS — D519 Vitamin B12 deficiency anemia, unspecified: Secondary | ICD-10-CM | POA: Diagnosis not present

## 2022-04-10 DIAGNOSIS — N1832 Chronic kidney disease, stage 3b: Secondary | ICD-10-CM | POA: Diagnosis not present

## 2022-04-10 DIAGNOSIS — G4700 Insomnia, unspecified: Secondary | ICD-10-CM | POA: Diagnosis not present

## 2022-04-18 DIAGNOSIS — M25511 Pain in right shoulder: Secondary | ICD-10-CM | POA: Diagnosis not present

## 2022-04-18 DIAGNOSIS — M1990 Unspecified osteoarthritis, unspecified site: Secondary | ICD-10-CM | POA: Diagnosis not present

## 2022-04-18 DIAGNOSIS — R21 Rash and other nonspecific skin eruption: Secondary | ICD-10-CM | POA: Diagnosis not present

## 2022-04-18 DIAGNOSIS — M7989 Other specified soft tissue disorders: Secondary | ICD-10-CM | POA: Diagnosis not present

## 2022-04-18 DIAGNOSIS — M7500 Adhesive capsulitis of unspecified shoulder: Secondary | ICD-10-CM | POA: Diagnosis not present

## 2022-04-18 DIAGNOSIS — Z79899 Other long term (current) drug therapy: Secondary | ICD-10-CM | POA: Diagnosis not present

## 2022-04-18 DIAGNOSIS — M79643 Pain in unspecified hand: Secondary | ICD-10-CM | POA: Diagnosis not present

## 2022-04-18 DIAGNOSIS — L4050 Arthropathic psoriasis, unspecified: Secondary | ICD-10-CM | POA: Diagnosis not present

## 2022-04-18 DIAGNOSIS — N1832 Chronic kidney disease, stage 3b: Secondary | ICD-10-CM | POA: Diagnosis not present

## 2022-04-18 DIAGNOSIS — M109 Gout, unspecified: Secondary | ICD-10-CM | POA: Diagnosis not present

## 2022-05-01 DIAGNOSIS — M1711 Unilateral primary osteoarthritis, right knee: Secondary | ICD-10-CM | POA: Diagnosis not present

## 2022-06-17 DIAGNOSIS — Z936 Other artificial openings of urinary tract status: Secondary | ICD-10-CM | POA: Diagnosis not present

## 2022-06-17 DIAGNOSIS — Z436 Encounter for attention to other artificial openings of urinary tract: Secondary | ICD-10-CM | POA: Diagnosis not present

## 2022-07-19 DIAGNOSIS — M7500 Adhesive capsulitis of unspecified shoulder: Secondary | ICD-10-CM | POA: Diagnosis not present

## 2022-07-19 DIAGNOSIS — L4050 Arthropathic psoriasis, unspecified: Secondary | ICD-10-CM | POA: Diagnosis not present

## 2022-07-19 DIAGNOSIS — M1990 Unspecified osteoarthritis, unspecified site: Secondary | ICD-10-CM | POA: Diagnosis not present

## 2022-07-19 DIAGNOSIS — R21 Rash and other nonspecific skin eruption: Secondary | ICD-10-CM | POA: Diagnosis not present

## 2022-07-19 DIAGNOSIS — M109 Gout, unspecified: Secondary | ICD-10-CM | POA: Diagnosis not present

## 2022-07-19 DIAGNOSIS — M7989 Other specified soft tissue disorders: Secondary | ICD-10-CM | POA: Diagnosis not present

## 2022-07-19 DIAGNOSIS — M79643 Pain in unspecified hand: Secondary | ICD-10-CM | POA: Diagnosis not present

## 2022-07-19 DIAGNOSIS — N1832 Chronic kidney disease, stage 3b: Secondary | ICD-10-CM | POA: Diagnosis not present

## 2022-07-19 DIAGNOSIS — Z79899 Other long term (current) drug therapy: Secondary | ICD-10-CM | POA: Diagnosis not present

## 2022-07-19 DIAGNOSIS — M25511 Pain in right shoulder: Secondary | ICD-10-CM | POA: Diagnosis not present

## 2022-08-27 IMAGING — CR DG CHEST 2V
2 series · 2 of 2 positions shown · non-contrast
Comparison: Chest x-ray 12/23/2017.

CLINICAL DATA: History of bladder cancer.

EXAM:
CHEST - 2 VIEW

[w chest pa]
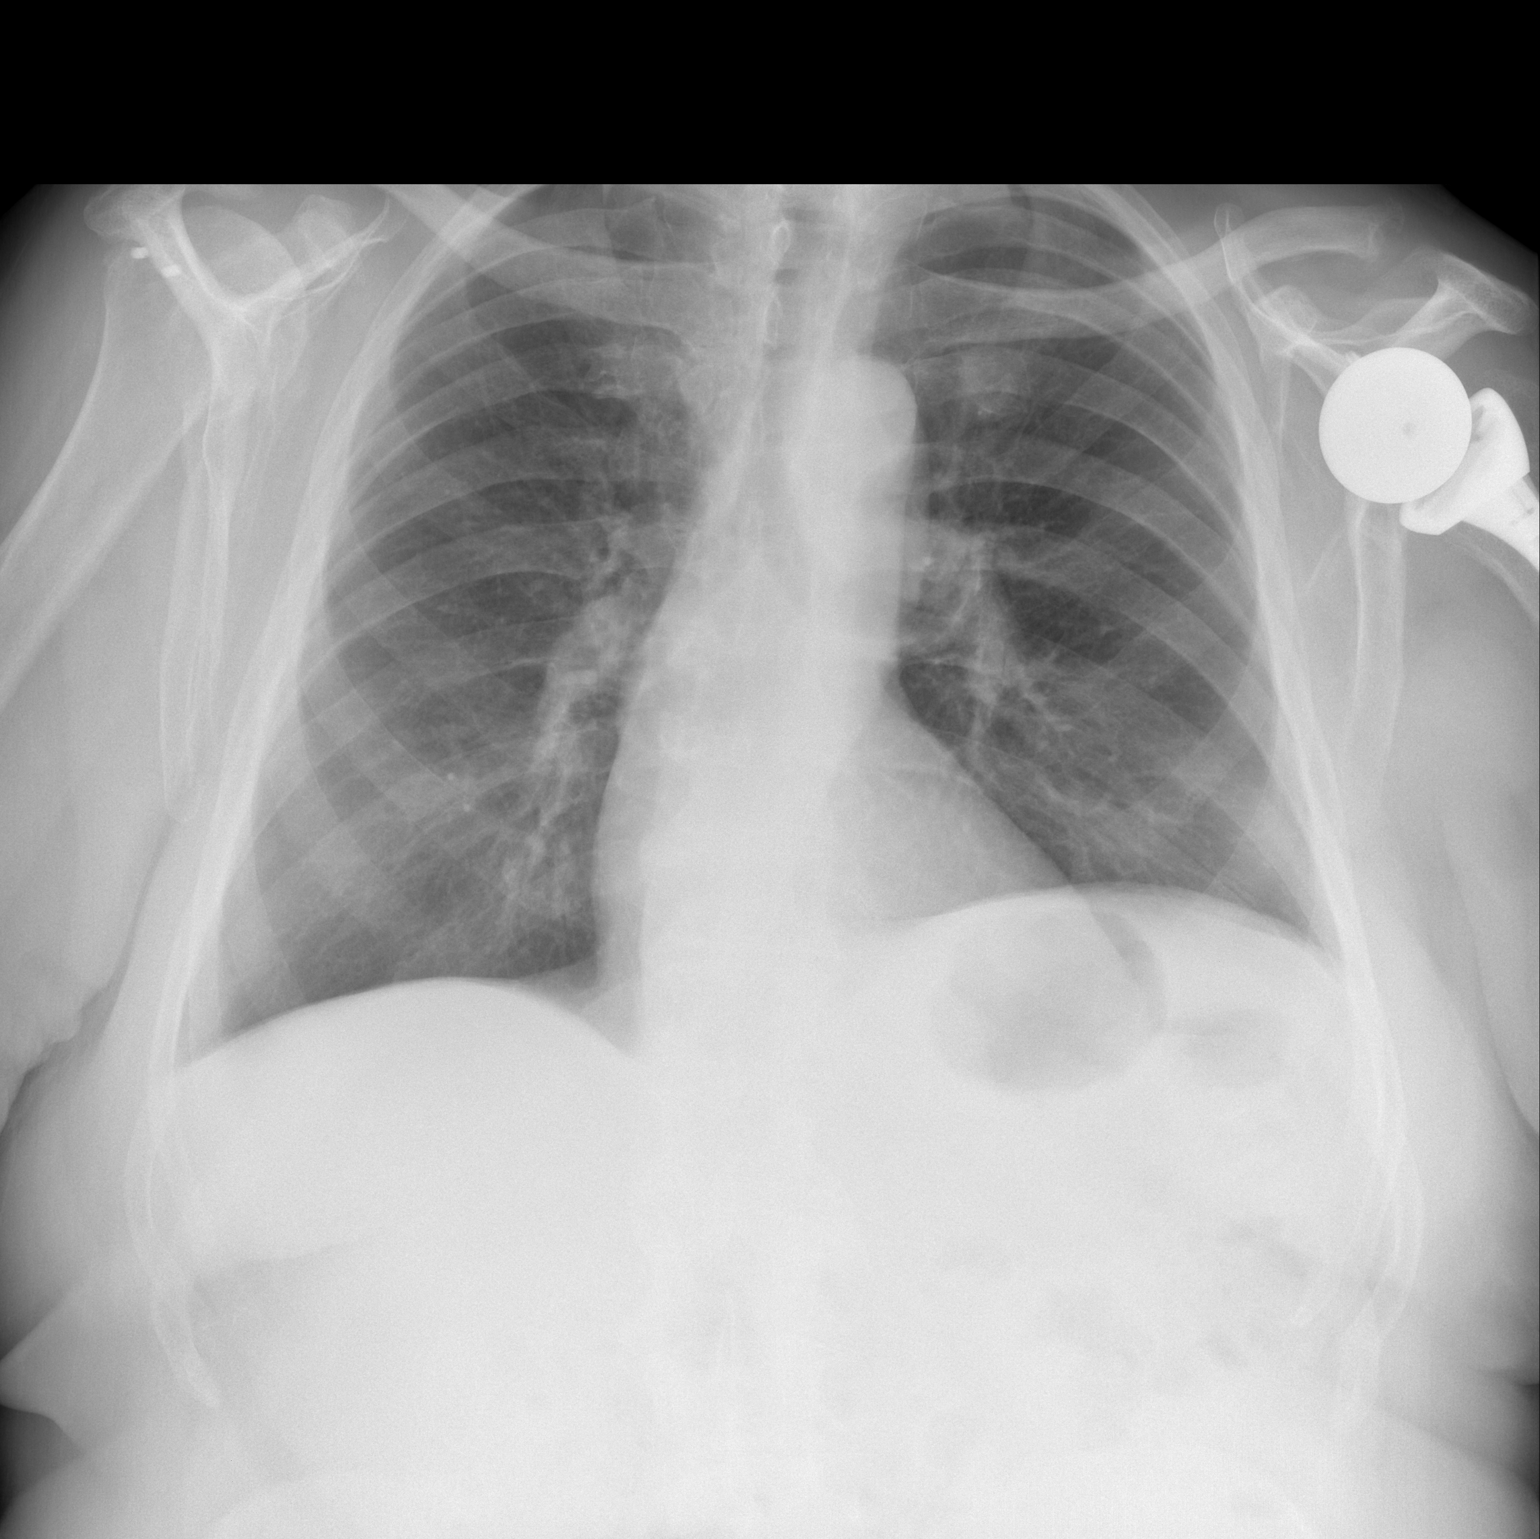

[w chest lat]
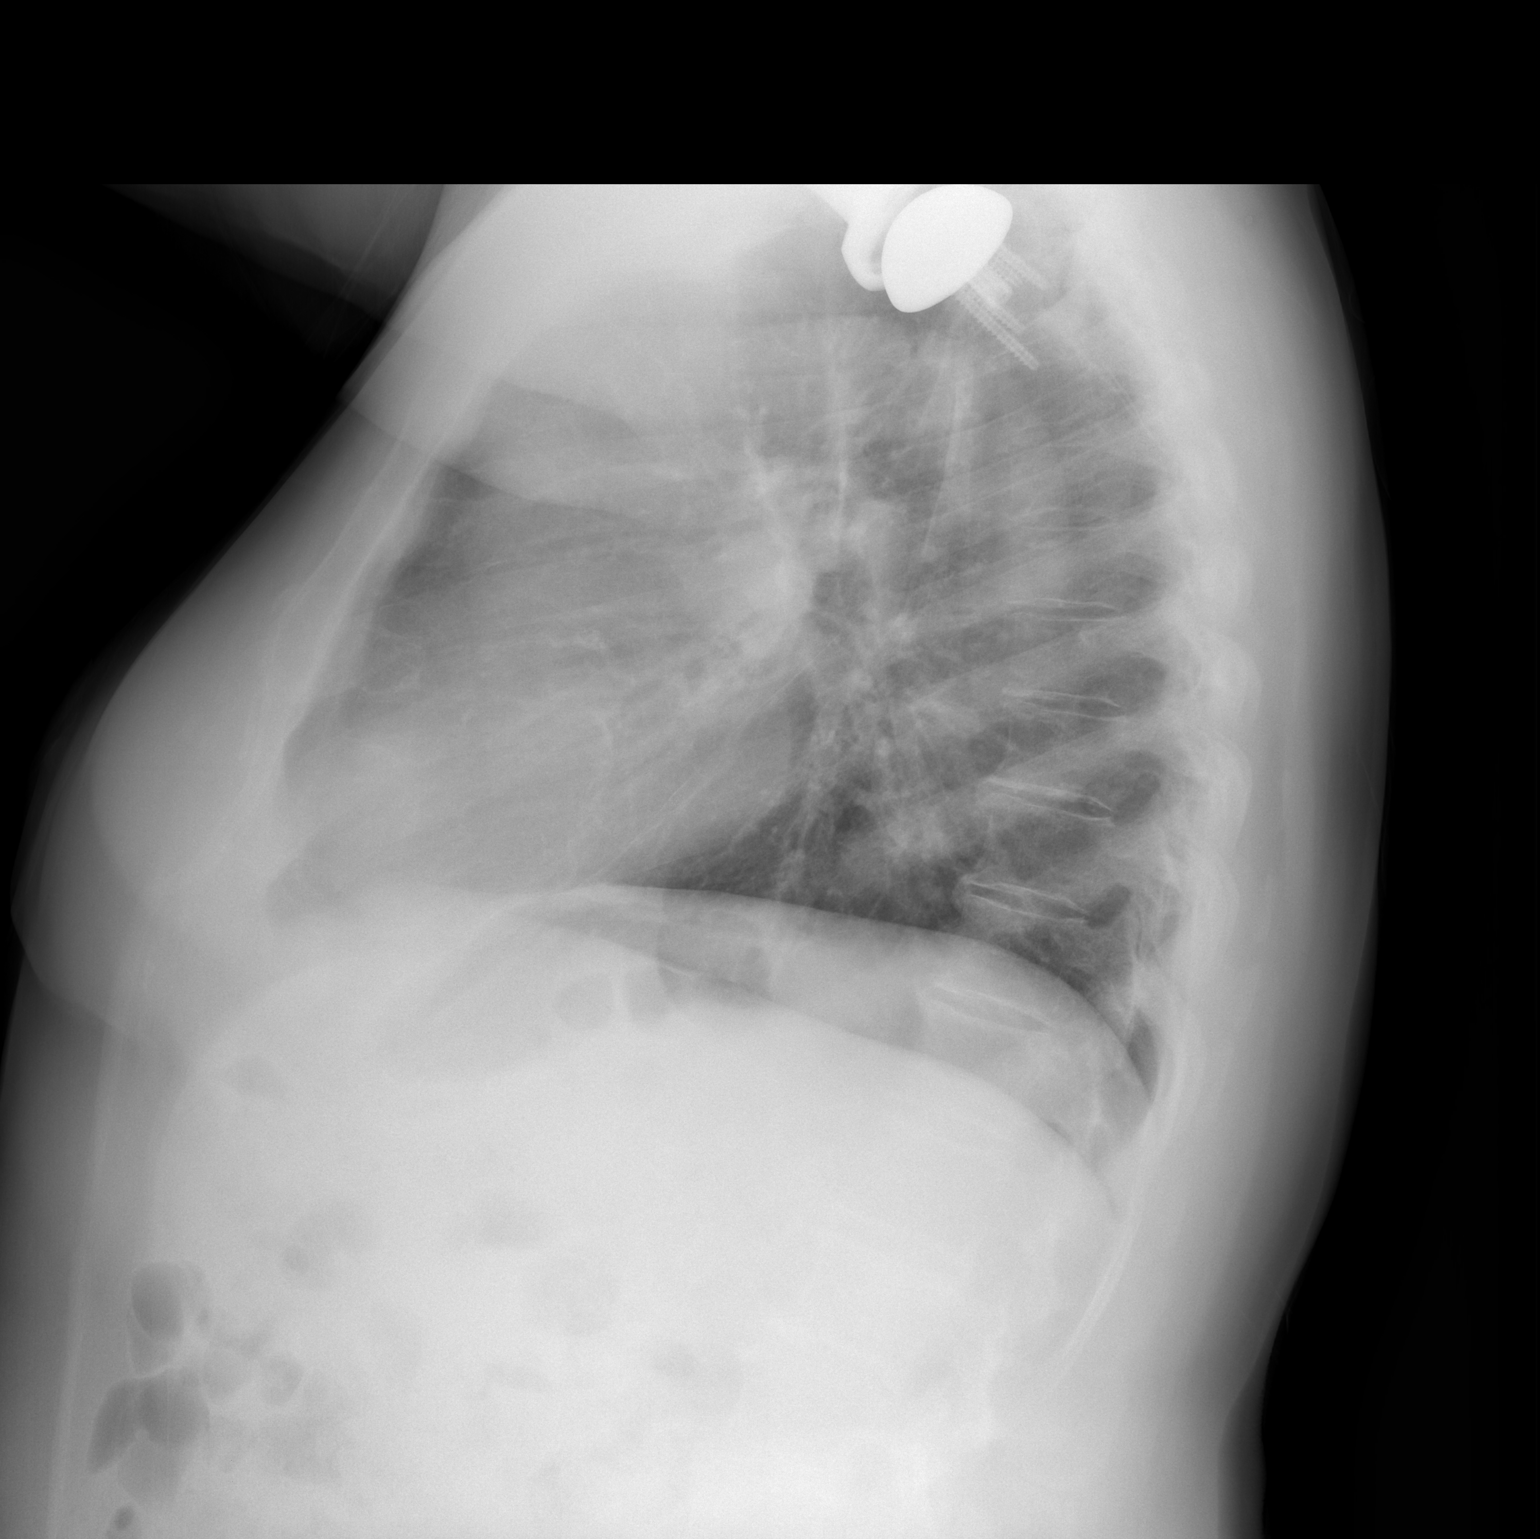

[2 of 2 positions shown; findings below may reference images not displayed]

FINDINGS: There is stable mild elevation of the left hemidiaphragm. The lungs
are clear. There is no pleural effusion or pneumothorax identified.
Cardiomediastinal silhouette is within normal limits. Left shoulder
arthroplasty is present. No acute fractures are seen. There are
postsurgical changes in the distal left clavicle and in the right
shoulder.
IMPRESSION: No active cardiopulmonary disease.

## 2022-10-02 DIAGNOSIS — I129 Hypertensive chronic kidney disease with stage 1 through stage 4 chronic kidney disease, or unspecified chronic kidney disease: Secondary | ICD-10-CM | POA: Diagnosis not present

## 2022-10-02 DIAGNOSIS — D649 Anemia, unspecified: Secondary | ICD-10-CM | POA: Diagnosis not present

## 2022-10-02 DIAGNOSIS — R5383 Other fatigue: Secondary | ICD-10-CM | POA: Diagnosis not present

## 2022-10-02 DIAGNOSIS — D519 Vitamin B12 deficiency anemia, unspecified: Secondary | ICD-10-CM | POA: Diagnosis not present

## 2022-10-02 DIAGNOSIS — N1832 Chronic kidney disease, stage 3b: Secondary | ICD-10-CM | POA: Diagnosis not present

## 2022-10-09 DIAGNOSIS — M1990 Unspecified osteoarthritis, unspecified site: Secondary | ICD-10-CM | POA: Diagnosis not present

## 2022-10-09 DIAGNOSIS — Z0000 Encounter for general adult medical examination without abnormal findings: Secondary | ICD-10-CM | POA: Diagnosis not present

## 2022-10-09 DIAGNOSIS — M109 Gout, unspecified: Secondary | ICD-10-CM | POA: Diagnosis not present

## 2022-10-09 DIAGNOSIS — Z79899 Other long term (current) drug therapy: Secondary | ICD-10-CM | POA: Diagnosis not present

## 2022-10-09 DIAGNOSIS — D649 Anemia, unspecified: Secondary | ICD-10-CM | POA: Diagnosis not present

## 2022-10-09 DIAGNOSIS — E7800 Pure hypercholesterolemia, unspecified: Secondary | ICD-10-CM | POA: Diagnosis not present

## 2022-10-09 DIAGNOSIS — N1832 Chronic kidney disease, stage 3b: Secondary | ICD-10-CM | POA: Diagnosis not present

## 2022-10-09 DIAGNOSIS — L4050 Arthropathic psoriasis, unspecified: Secondary | ICD-10-CM | POA: Diagnosis not present

## 2022-10-09 DIAGNOSIS — I129 Hypertensive chronic kidney disease with stage 1 through stage 4 chronic kidney disease, or unspecified chronic kidney disease: Secondary | ICD-10-CM | POA: Diagnosis not present

## 2022-10-09 DIAGNOSIS — B351 Tinea unguium: Secondary | ICD-10-CM | POA: Diagnosis not present

## 2022-10-09 DIAGNOSIS — D519 Vitamin B12 deficiency anemia, unspecified: Secondary | ICD-10-CM | POA: Diagnosis not present

## 2022-10-17 DIAGNOSIS — Z436 Encounter for attention to other artificial openings of urinary tract: Secondary | ICD-10-CM | POA: Diagnosis not present

## 2022-10-22 DIAGNOSIS — M109 Gout, unspecified: Secondary | ICD-10-CM | POA: Diagnosis not present

## 2022-10-22 DIAGNOSIS — Z23 Encounter for immunization: Secondary | ICD-10-CM | POA: Diagnosis not present

## 2022-10-22 DIAGNOSIS — M7989 Other specified soft tissue disorders: Secondary | ICD-10-CM | POA: Diagnosis not present

## 2022-10-22 DIAGNOSIS — M7500 Adhesive capsulitis of unspecified shoulder: Secondary | ICD-10-CM | POA: Diagnosis not present

## 2022-10-22 DIAGNOSIS — M1990 Unspecified osteoarthritis, unspecified site: Secondary | ICD-10-CM | POA: Diagnosis not present

## 2022-10-22 DIAGNOSIS — N1832 Chronic kidney disease, stage 3b: Secondary | ICD-10-CM | POA: Diagnosis not present

## 2022-10-22 DIAGNOSIS — L4050 Arthropathic psoriasis, unspecified: Secondary | ICD-10-CM | POA: Diagnosis not present

## 2022-10-22 DIAGNOSIS — Z79899 Other long term (current) drug therapy: Secondary | ICD-10-CM | POA: Diagnosis not present

## 2022-10-22 DIAGNOSIS — M79643 Pain in unspecified hand: Secondary | ICD-10-CM | POA: Diagnosis not present

## 2022-10-22 DIAGNOSIS — R21 Rash and other nonspecific skin eruption: Secondary | ICD-10-CM | POA: Diagnosis not present

## 2022-10-22 DIAGNOSIS — M25511 Pain in right shoulder: Secondary | ICD-10-CM | POA: Diagnosis not present

## 2023-01-25 DIAGNOSIS — M25511 Pain in right shoulder: Secondary | ICD-10-CM | POA: Diagnosis not present

## 2023-01-25 DIAGNOSIS — M79643 Pain in unspecified hand: Secondary | ICD-10-CM | POA: Diagnosis not present

## 2023-01-25 DIAGNOSIS — M1990 Unspecified osteoarthritis, unspecified site: Secondary | ICD-10-CM | POA: Diagnosis not present

## 2023-01-25 DIAGNOSIS — M7500 Adhesive capsulitis of unspecified shoulder: Secondary | ICD-10-CM | POA: Diagnosis not present

## 2023-01-25 DIAGNOSIS — Z79899 Other long term (current) drug therapy: Secondary | ICD-10-CM | POA: Diagnosis not present

## 2023-01-25 DIAGNOSIS — R21 Rash and other nonspecific skin eruption: Secondary | ICD-10-CM | POA: Diagnosis not present

## 2023-01-25 DIAGNOSIS — N1832 Chronic kidney disease, stage 3b: Secondary | ICD-10-CM | POA: Diagnosis not present

## 2023-01-25 DIAGNOSIS — M7989 Other specified soft tissue disorders: Secondary | ICD-10-CM | POA: Diagnosis not present

## 2023-01-25 DIAGNOSIS — M109 Gout, unspecified: Secondary | ICD-10-CM | POA: Diagnosis not present

## 2023-01-25 DIAGNOSIS — L4050 Arthropathic psoriasis, unspecified: Secondary | ICD-10-CM | POA: Diagnosis not present

## 2023-02-27 ENCOUNTER — Other Ambulatory Visit (HOSPITAL_COMMUNITY): Payer: Self-pay | Admitting: Urology

## 2023-02-27 ENCOUNTER — Ambulatory Visit (HOSPITAL_COMMUNITY)
Admission: RE | Admit: 2023-02-27 | Discharge: 2023-02-27 | Disposition: A | Discharge status: Home / Self Care | Payer: PPO | Source: Ambulatory Visit | Attending: Urology | Admitting: Urology

## 2023-02-27 DIAGNOSIS — C670 Malignant neoplasm of trigone of bladder: Secondary | ICD-10-CM

## 2023-02-27 DIAGNOSIS — I10 Essential (primary) hypertension: Secondary | ICD-10-CM | POA: Diagnosis not present

## 2023-02-27 DIAGNOSIS — R011 Cardiac murmur, unspecified: Secondary | ICD-10-CM | POA: Diagnosis not present

## 2023-02-27 DIAGNOSIS — C61 Malignant neoplasm of prostate: Secondary | ICD-10-CM | POA: Diagnosis not present

## 2023-02-27 DIAGNOSIS — Z8551 Personal history of malignant neoplasm of bladder: Secondary | ICD-10-CM | POA: Diagnosis not present

## 2023-03-06 DIAGNOSIS — C670 Malignant neoplasm of trigone of bladder: Secondary | ICD-10-CM | POA: Diagnosis not present

## 2023-03-06 DIAGNOSIS — K8020 Calculus of gallbladder without cholecystitis without obstruction: Secondary | ICD-10-CM | POA: Diagnosis not present

## 2023-03-06 DIAGNOSIS — C679 Malignant neoplasm of bladder, unspecified: Secondary | ICD-10-CM | POA: Diagnosis not present

## 2023-03-22 DIAGNOSIS — Z936 Other artificial openings of urinary tract status: Secondary | ICD-10-CM | POA: Diagnosis not present

## 2023-03-22 DIAGNOSIS — C670 Malignant neoplasm of trigone of bladder: Secondary | ICD-10-CM | POA: Diagnosis not present

## 2023-03-22 DIAGNOSIS — C61 Malignant neoplasm of prostate: Secondary | ICD-10-CM | POA: Diagnosis not present

## 2023-03-22 DIAGNOSIS — K435 Parastomal hernia without obstruction or  gangrene: Secondary | ICD-10-CM | POA: Diagnosis not present

## 2023-04-02 DIAGNOSIS — R946 Abnormal results of thyroid function studies: Secondary | ICD-10-CM | POA: Diagnosis not present

## 2023-04-02 DIAGNOSIS — E7800 Pure hypercholesterolemia, unspecified: Secondary | ICD-10-CM | POA: Diagnosis not present

## 2023-04-02 DIAGNOSIS — I129 Hypertensive chronic kidney disease with stage 1 through stage 4 chronic kidney disease, or unspecified chronic kidney disease: Secondary | ICD-10-CM | POA: Diagnosis not present

## 2023-04-02 DIAGNOSIS — D649 Anemia, unspecified: Secondary | ICD-10-CM | POA: Diagnosis not present

## 2023-04-02 DIAGNOSIS — N1832 Chronic kidney disease, stage 3b: Secondary | ICD-10-CM | POA: Diagnosis not present

## 2023-04-11 DIAGNOSIS — M109 Gout, unspecified: Secondary | ICD-10-CM | POA: Diagnosis not present

## 2023-04-11 DIAGNOSIS — R630 Anorexia: Secondary | ICD-10-CM | POA: Diagnosis not present

## 2023-04-11 DIAGNOSIS — R059 Cough, unspecified: Secondary | ICD-10-CM | POA: Diagnosis not present

## 2023-04-11 DIAGNOSIS — M1990 Unspecified osteoarthritis, unspecified site: Secondary | ICD-10-CM | POA: Diagnosis not present

## 2023-04-11 DIAGNOSIS — I129 Hypertensive chronic kidney disease with stage 1 through stage 4 chronic kidney disease, or unspecified chronic kidney disease: Secondary | ICD-10-CM | POA: Diagnosis not present

## 2023-04-11 DIAGNOSIS — R0989 Other specified symptoms and signs involving the circulatory and respiratory systems: Secondary | ICD-10-CM | POA: Diagnosis not present

## 2023-04-11 DIAGNOSIS — G4700 Insomnia, unspecified: Secondary | ICD-10-CM | POA: Diagnosis not present

## 2023-04-11 DIAGNOSIS — M539 Dorsopathy, unspecified: Secondary | ICD-10-CM | POA: Diagnosis not present

## 2023-04-11 DIAGNOSIS — R634 Abnormal weight loss: Secondary | ICD-10-CM | POA: Diagnosis not present

## 2023-04-11 DIAGNOSIS — N1832 Chronic kidney disease, stage 3b: Secondary | ICD-10-CM | POA: Diagnosis not present

## 2023-04-11 DIAGNOSIS — D649 Anemia, unspecified: Secondary | ICD-10-CM | POA: Diagnosis not present

## 2023-04-25 DIAGNOSIS — M109 Gout, unspecified: Secondary | ICD-10-CM | POA: Diagnosis not present

## 2023-04-25 DIAGNOSIS — M7989 Other specified soft tissue disorders: Secondary | ICD-10-CM | POA: Diagnosis not present

## 2023-04-25 DIAGNOSIS — M25511 Pain in right shoulder: Secondary | ICD-10-CM | POA: Diagnosis not present

## 2023-04-25 DIAGNOSIS — M1990 Unspecified osteoarthritis, unspecified site: Secondary | ICD-10-CM | POA: Diagnosis not present

## 2023-04-25 DIAGNOSIS — Z79899 Other long term (current) drug therapy: Secondary | ICD-10-CM | POA: Diagnosis not present

## 2023-04-25 DIAGNOSIS — Z436 Encounter for attention to other artificial openings of urinary tract: Secondary | ICD-10-CM | POA: Diagnosis not present

## 2023-04-25 DIAGNOSIS — N1832 Chronic kidney disease, stage 3b: Secondary | ICD-10-CM | POA: Diagnosis not present

## 2023-04-25 DIAGNOSIS — M79643 Pain in unspecified hand: Secondary | ICD-10-CM | POA: Diagnosis not present

## 2023-04-25 DIAGNOSIS — L4050 Arthropathic psoriasis, unspecified: Secondary | ICD-10-CM | POA: Diagnosis not present

## 2023-04-25 DIAGNOSIS — I878 Other specified disorders of veins: Secondary | ICD-10-CM | POA: Diagnosis not present

## 2023-05-21 DIAGNOSIS — M25511 Pain in right shoulder: Secondary | ICD-10-CM | POA: Diagnosis not present

## 2023-05-28 DIAGNOSIS — M25511 Pain in right shoulder: Secondary | ICD-10-CM | POA: Diagnosis not present

## 2023-06-11 DIAGNOSIS — M25511 Pain in right shoulder: Secondary | ICD-10-CM | POA: Diagnosis not present

## 2023-07-04 DIAGNOSIS — N1832 Chronic kidney disease, stage 3b: Secondary | ICD-10-CM | POA: Diagnosis not present

## 2023-07-04 DIAGNOSIS — R634 Abnormal weight loss: Secondary | ICD-10-CM | POA: Diagnosis not present

## 2023-07-04 DIAGNOSIS — I129 Hypertensive chronic kidney disease with stage 1 through stage 4 chronic kidney disease, or unspecified chronic kidney disease: Secondary | ICD-10-CM | POA: Diagnosis not present

## 2023-07-11 DIAGNOSIS — G4700 Insomnia, unspecified: Secondary | ICD-10-CM | POA: Diagnosis not present

## 2023-07-11 DIAGNOSIS — R5383 Other fatigue: Secondary | ICD-10-CM | POA: Diagnosis not present

## 2023-07-11 DIAGNOSIS — N1832 Chronic kidney disease, stage 3b: Secondary | ICD-10-CM | POA: Diagnosis not present

## 2023-07-11 DIAGNOSIS — M539 Dorsopathy, unspecified: Secondary | ICD-10-CM | POA: Diagnosis not present

## 2023-07-11 DIAGNOSIS — I129 Hypertensive chronic kidney disease with stage 1 through stage 4 chronic kidney disease, or unspecified chronic kidney disease: Secondary | ICD-10-CM | POA: Diagnosis not present

## 2023-07-11 DIAGNOSIS — M170 Bilateral primary osteoarthritis of knee: Secondary | ICD-10-CM | POA: Diagnosis not present

## 2023-07-25 DIAGNOSIS — S46011A Strain of muscle(s) and tendon(s) of the rotator cuff of right shoulder, initial encounter: Secondary | ICD-10-CM | POA: Diagnosis not present

## 2023-07-26 ENCOUNTER — Other Ambulatory Visit: Payer: Self-pay | Admitting: Orthopaedic Surgery

## 2023-07-26 DIAGNOSIS — Z01818 Encounter for other preprocedural examination: Secondary | ICD-10-CM | POA: Diagnosis not present

## 2023-07-26 DIAGNOSIS — I129 Hypertensive chronic kidney disease with stage 1 through stage 4 chronic kidney disease, or unspecified chronic kidney disease: Secondary | ICD-10-CM | POA: Diagnosis not present

## 2023-07-26 DIAGNOSIS — R011 Cardiac murmur, unspecified: Secondary | ICD-10-CM | POA: Diagnosis not present

## 2023-07-26 DIAGNOSIS — G8929 Other chronic pain: Secondary | ICD-10-CM

## 2023-07-26 DIAGNOSIS — Z23 Encounter for immunization: Secondary | ICD-10-CM | POA: Diagnosis not present

## 2023-08-02 ENCOUNTER — Ambulatory Visit
Admission: RE | Admit: 2023-08-02 | Discharge: 2023-08-02 | Disposition: A | Discharge status: Home / Self Care | Source: Ambulatory Visit | Attending: Orthopaedic Surgery | Admitting: Orthopaedic Surgery

## 2023-08-02 DIAGNOSIS — M19011 Primary osteoarthritis, right shoulder: Secondary | ICD-10-CM | POA: Diagnosis not present

## 2023-08-02 DIAGNOSIS — M75121 Complete rotator cuff tear or rupture of right shoulder, not specified as traumatic: Secondary | ICD-10-CM | POA: Diagnosis not present

## 2023-08-02 DIAGNOSIS — G8929 Other chronic pain: Secondary | ICD-10-CM

## 2023-08-07 DIAGNOSIS — R011 Cardiac murmur, unspecified: Secondary | ICD-10-CM | POA: Diagnosis not present

## 2023-08-26 ENCOUNTER — Ambulatory Visit
Admission: RE | Admit: 2023-08-26 | Discharge: 2023-08-26 | Disposition: A | Discharge status: Home / Self Care | Source: Ambulatory Visit

## 2023-08-26 VITALS — BP 137/87 | HR 63 | Temp 97.5°F | Resp 18

## 2023-08-26 DIAGNOSIS — L03211 Cellulitis of face: Secondary | ICD-10-CM

## 2023-08-26 DIAGNOSIS — L259 Unspecified contact dermatitis, unspecified cause: Secondary | ICD-10-CM

## 2023-08-26 MED ORDER — DOXYCYCLINE HYCLATE 100 MG PO CAPS: 100.0000 mg | ORAL_CAPSULE | Freq: Two times a day (BID) | ORAL | 0 refills | Status: AC

## 2023-08-26 MED ORDER — METHYLPREDNISOLONE ACETATE 40 MG/ML IJ SUSP
20.0000 mg | Freq: Once | INTRAMUSCULAR | Status: AC
  Administered 2023-08-26: 20 mg via INTRAMUSCULAR

## 2023-08-26 NOTE — ED Triage Notes (Signed)
 Pt started breaking out on bil arms 5 days ago with itching.  Now pt has rash on right side of face with redness and swelling

## 2023-08-29 NOTE — ED Provider Notes (Signed)
 [facility]    CSN: [id number] Arrival date & time: 08/26/23  1150      History   Chief Complaint Chief Complaint  Patient presents with   Poison Ivy    Swelling in face eye area itching redness not sure about pouson ivy - Entered by patient    HPI Jeremiah Wright is a 83 y.o. male.   Patient presents today for evaluation of rash to bilateral arms that is itchy that has been present for 5 days.  He also reports the rash is now spread to the right side of his face and he has some swelling and redness to this area.  He has not had any shortness of breath or trouble breathing or swallowing.  He denies fever. He has used topical treatment without resolution.   The history is provided by the patient.  Poison Ivy    Past Medical History:  Diagnosis Date   Arthritis    SHOULDERS AND KNEES - ARTHRITIS AND PAIN   Bladder cancer (HCC)    [name]   prostate   Heart murmur    PT TOLD SLIGHT HEART MURMUR - DOESN'T CAUSE ANY PROBLEMS   History of diverticulitis of colon    Hypertension    Insomnia     Patient Active Problem List   Diagnosis Date Noted   Primary localized osteoarthritis of right knee 12/08/2021   S/P total knee arthroplasty, left 01/13/2018   Degenerative arthritis of left knee 01/08/2018   Bladder cancer (HCC) 09/14/2013   Arthritis of shoulder region, left 11/30/2011    Past Surgical History:  Procedure Laterality Date   CARDIOVASCULAR STRESS TEST  10-14-2008  DR BERRY   NORMAL PERFUSION STUDY/ NO ISCHEMIA/ EF 61%   CYSTOSCOPY N/A 03/02/2013   Procedure: CYSTOSCOPY WITH INSTILLATION OF MITOMYCIN  C;  Surgeon: Arlena LILLETTE Gal, MD;  Location: [facility];  Service: Urology;  Laterality: N/A;   CYSTOSCOPY N/A 12/30/2013   Procedure: CYSTOSCOPY WITH INDOCYANINE GREEN  DYE INJECTION;  Surgeon: Ricardo Likens, MD;  Location: [facility];  Service: Urology;  Laterality: N/A;   CYSTOSCOPY W/ RETROGRADES Right 04/17/2012   Procedure:  CYSTOSCOPY WITH RETROGRADE PYELOGRAM;  Surgeon: Arlena LILLETTE Gal, MD;  Location: [facility];  Service: Urology;  Laterality: Right;   CYSTOSCOPY WITH BIOPSY Right 04/17/2012   Procedure: CYSTOSCOPY WITH  BIOPSY OF RIGHT BLADDER DIVERTICULUM TUMOR BUTTON VAPORIZATION OF TUMOR INSIDE DIVERTICULUM;  Surgeon: Arlena LILLETTE Gal, MD;  Location: [facility];  Service: Urology;  Laterality: Right;  CYSTOSCOPY WITH COLD CUP BIOPSY OF RIGHT BLADDER DIVERTICULUM TUMOR/HOLMIUM LASER OF BLADDER TUMOR/POSSIBLE BUTTON VAPORIZATION OF TUMOR INSIDE DIVERTICULUM   CYSTOSCOPY WITH BIOPSY N/A 11/07/2012   Procedure: CYSTOSCOPY WITH COLD CUP BIOPSY AND FULGERATION;  Surgeon: Arlena LILLETTE Gal, MD;  Location: [facility];  Service: Urology;  Laterality: N/A;   CYSTOSCOPY WITH RETROGRADE PYELOGRAM, URETEROSCOPY AND STENT PLACEMENT Bilateral 09/14/2013   Procedure: CYSTO BILATERAL RETROGRADE PYELOGRAM;  Surgeon: Arlena LILLETTE Gal, MD;  Location: [facility];  Service: Urology;  Laterality: Bilateral;   FATTY TUMOR     REMOVED FROM BACK   JOINT REPLACEMENT     Left total knee arthroplasty Dr. Liam  01-13-18   KNEE ARTHROSCOPY Bilateral >10 YRS AGO   OPEN ACROMIONECTOMY/ RESECTION DISTAL CLAVICLE/ REPAIR ROTATOR CUFF  03-15-2009   LEFT SHOULDER   REVERSE SHOULDER ARTHROPLASTY  11/29/2011   Procedure: REVERSE SHOULDER ARTHROPLASTY;  Surgeon: Eva Elsie Herring, MD;  Location: [facility];  Service: Orthopedics;  Laterality: Left;  Left Reverse Total Shoulder Arthroplasty with left supra clavicular block   ROBOT ASSISTED LAPAROSCOPIC COMPLETE CYSTECT ILEAL CONDUIT N/A 12/30/2013   Procedure: XI ROBOTIC ASSISTED LAPAROSCOPIC COMPLETE CYSTECTOMY, ILEAL CONDUIT;  Surgeon: Ricardo Likens, MD;  Location: [facility];  Service: Urology;  Laterality: N/A;   SECONDARY CLOSURE ARM     RT ARM INJURY   SHOULDER OPEN ROTATOR CUFF REPAIR  07-18-2001   AND PARTIAL ACROMIONECTOMY/  ACROMIOPLASTY ,  RIGHT SHOULDER   TONSILLECTOMY  as child   TOTAL KNEE ARTHROPLASTY Left 01/13/2018   Procedure: TOTAL KNEE ARTHROPLASTY;  Surgeon: Liam Lerner, MD;  Location: [facility];  Service: Orthopedics;  Laterality: Left;   TOTAL KNEE ARTHROPLASTY Right 12/08/2021   Procedure: TOTAL KNEE ARTHROPLASTY;  Surgeon: Shari Sieving, MD;  Location: [facility];  Service: Orthopedics;  Laterality: Right;   TRANSTHORACIC ECHOCARDIOGRAM  09-07-2008   DR BERRY   LVEF 55-65%/ BORDERLINE LV HYPERTROPHY   TRANSURETHRAL RESECTION OF BLADDER TUMOR N/A 03/02/2013   Procedure: TRANSURETHRAL RESECTION OF BLADDER TUMOR (TURBT);  Surgeon: Arlena LILLETTE Gal, MD;  Location: [facility];  Service: Urology;  Laterality: N/A;   TRANSURETHRAL RESECTION OF BLADDER TUMOR N/A 09/14/2013   Procedure: TRANSURETHRAL RESECTION OF BLADDER TUMOR (TURBT)/TURP WITH GYRUS;  Surgeon: Arlena LILLETTE Gal, MD;  Location: [facility];  Service: Urology;  Laterality: N/A;   TRANSURETHRAL RESECTION OF BLADDER TUMOR WITH MITOMYCIN -C N/A 11/07/2012   Procedure: TRANSURETHRAL RESECTION OF BLADDER TUMOR WITH MITOMYCIN -C;  Surgeon: Arlena LILLETTE Gal, MD;  Location: [facility];  Service: Urology;  Laterality: N/A;       Home Medications    Prior to Admission medications   Medication Sig Start Date End Date Taking? Authorizing Provider  doxycycline  (VIBRAMYCIN ) 100 MG capsule Take 1 capsule (100 mg total) by mouth 2 (two) times daily for 7 days. 08/26/23 09/02/23 Yes Billy Asberry FALCON, PA-C  LORazepam  (ATIVAN ) 1 MG tablet Take 1 mg by mouth at bedtime. 07/19/23  Yes [provider]  allopurinol (ZYLOPRIM) 100 MG tablet Take 100 mg by mouth daily.    [provider]  carvedilol  (COREG ) 12.5 MG tablet Take 12.5 mg by mouth at bedtime. 01/01/18   [provider]  furosemide  (LASIX ) 20 MG tablet Take 10 mg by mouth daily as needed (swelling). 10/12/21   [provider]  hydrocortisone   cream 1 % Apply 1 application topically 2 (two) times daily as needed for itching. Patient not taking: Reported on 12/08/2021    [provider]  leflunomide  (ARAVA ) 20 MG tablet Take 20 mg by mouth at bedtime. 10/18/21   [provider]  LORazepam  (ATIVAN ) 0.5 MG tablet Take 0.5 mg by mouth daily as needed for anxiety. Patient not taking: Reported on 08/26/2023 10/17/17   [provider]  oxyCODONE  (OXY IR/ROXICODONE ) 5 MG immediate release tablet Take one tab po q4-6hrs prn pain Patient not taking: Reported on 08/26/2023 12/08/21   Chadwell, Joshua, PA-C  traMADol  (ULTRAM ) 50 MG tablet Take 50-100 mg by mouth every 6 (six) hours as needed for moderate pain. 11/13/21   [provider]  valsartan (DIOVAN) 40 MG tablet Take 40 mg by mouth in the morning.    [provider]  zolpidem  (AMBIEN ) 10 MG tablet Take 7.5-10 mg by mouth at bedtime. Patient not taking: Reported on 08/26/2023    [provider]    Family History No family history on file.  Social History Social History  Tobacco Use   Smoking status: Former    Types: Cigars    Quit date: 11/20/2003    Years since quitting: 19.7   Smokeless tobacco: Never  Vaping Use   Vaping status: Never Used  Substance Use Topics   Alcohol use: No   Drug use: No     Allergies   Prednisone   Review of Systems Review of Systems  Constitutional:  Negative for chills and fever.  Eyes:  Negative for discharge and redness.  Skin:  Positive for color change and rash.  Neurological:  Negative for numbness.     Physical Exam Triage Vital Signs ED Triage Vitals  Encounter Vitals Group     BP 08/26/23 1212 137/87     Girls Systolic BP Percentile --      Girls Diastolic BP Percentile --      Boys Systolic BP Percentile --      Boys Diastolic BP Percentile --      Pulse Rate 08/26/23 1212 63     Resp 08/26/23 1212 18     Temp 08/26/23 1212 (!) 97.5 F (36.4 C)     Temp Source 08/26/23  1212 Oral     SpO2 08/26/23 1212 98 %     Weight --      Height --      Head Circumference --      Peak Flow --      Pain Score 08/26/23 1214 0     Pain Loc --      Pain Education --      Exclude from Growth Chart --    No data found.  Updated Vital Signs BP 137/87 (BP Location: Left Arm)   Pulse 63   Temp (!) 97.5 F (36.4 C) (Oral)   Resp 18   SpO2 98%   Visual Acuity Right Eye Distance:   Left Eye Distance:   Bilateral Distance:    Right Eye Near:   Left Eye Near:    Bilateral Near:     Physical Exam Vitals and nursing note reviewed.  Constitutional:      General: He is not in acute distress.    Appearance: Normal appearance. He is not ill-appearing.  HENT:     Head: Normocephalic and atraumatic.  Eyes:     Conjunctiva/sclera: Conjunctivae normal.  Cardiovascular:     Rate and Rhythm: Normal rate.  Pulmonary:     Effort: Pulmonary effort is normal. No respiratory distress.  Skin:    Comments: See photos of rash and face  Neurological:     Mental Status: He is alert.  Psychiatric:        Mood and Affect: Mood normal.        Behavior: Behavior normal.        Thought Content: Thought content normal.           UC Treatments / Results  Labs (all labs ordered are listed, but only abnormal results are displayed) Labs Reviewed - No data to display  EKG   Radiology No results found.  Procedures Procedures (including critical care time)  Medications Ordered in UC Medications  methylPREDNISolone  acetate (DEPO-MEDROL ) injection 20 mg (20 mg Intramuscular Given 08/26/23 1242)    Initial Impression / Assessment and Plan / UC Course  I have reviewed the triage vital signs and the nursing notes.  Pertinent labs & imaging results that were available during my care of the patient were reviewed by me and considered in my medical decision  making (see chart for details).    Steroid injection administered in office to hopefully help with suspected  reaction to contact.  Will treat with antibiotics to hopefully also cover developing cellulitis to face.  Advised follow-up if no gradual improvement or with any worsening advised emergency room visit.  Patient expressed understanding. Advised to avoid topical OTC treatment on face.  Final Clinical Impressions(s) / UC Diagnoses   Final diagnoses:  Contact dermatitis, unspecified contact dermatitis type, unspecified trigger  Facial cellulitis   Discharge Instructions   None    ED Prescriptions     Medication Sig Dispense Auth. Provider   doxycycline  (VIBRAMYCIN ) 100 MG capsule Take 1 capsule (100 mg total) by mouth 2 (two) times daily for 7 days. 14 capsule Billy Asberry FALCON, PA-C      PDMP not reviewed this encounter.   Billy Asberry FALCON, PA-C 08/29/23 1652

## 2023-10-10 DIAGNOSIS — M79641 Pain in right hand: Secondary | ICD-10-CM | POA: Diagnosis not present

## 2023-10-10 DIAGNOSIS — M2540 Effusion, unspecified joint: Secondary | ICD-10-CM | POA: Diagnosis not present

## 2023-10-10 DIAGNOSIS — R5383 Other fatigue: Secondary | ICD-10-CM | POA: Diagnosis not present

## 2023-10-10 DIAGNOSIS — Z6835 Body mass index (BMI) 35.0-35.9, adult: Secondary | ICD-10-CM | POA: Diagnosis not present

## 2023-10-10 DIAGNOSIS — Z111 Encounter for screening for respiratory tuberculosis: Secondary | ICD-10-CM | POA: Diagnosis not present

## 2023-10-10 DIAGNOSIS — M109 Gout, unspecified: Secondary | ICD-10-CM | POA: Diagnosis not present

## 2023-10-10 DIAGNOSIS — E669 Obesity, unspecified: Secondary | ICD-10-CM | POA: Diagnosis not present

## 2023-10-10 DIAGNOSIS — L4050 Arthropathic psoriasis, unspecified: Secondary | ICD-10-CM | POA: Diagnosis not present

## 2023-10-10 DIAGNOSIS — M2560 Stiffness of unspecified joint, not elsewhere classified: Secondary | ICD-10-CM | POA: Diagnosis not present

## 2023-10-10 DIAGNOSIS — M79642 Pain in left hand: Secondary | ICD-10-CM | POA: Diagnosis not present

## 2023-10-17 DIAGNOSIS — F419 Anxiety disorder, unspecified: Secondary | ICD-10-CM | POA: Diagnosis not present

## 2023-10-17 DIAGNOSIS — Z8551 Personal history of malignant neoplasm of bladder: Secondary | ICD-10-CM | POA: Diagnosis not present

## 2023-10-17 DIAGNOSIS — R7309 Other abnormal glucose: Secondary | ICD-10-CM | POA: Diagnosis not present

## 2023-10-17 DIAGNOSIS — G4700 Insomnia, unspecified: Secondary | ICD-10-CM | POA: Diagnosis not present

## 2023-10-17 DIAGNOSIS — D519 Vitamin B12 deficiency anemia, unspecified: Secondary | ICD-10-CM | POA: Diagnosis not present

## 2023-10-17 DIAGNOSIS — E7800 Pure hypercholesterolemia, unspecified: Secondary | ICD-10-CM | POA: Diagnosis not present

## 2023-10-17 DIAGNOSIS — Z0000 Encounter for general adult medical examination without abnormal findings: Secondary | ICD-10-CM | POA: Diagnosis not present

## 2023-10-17 DIAGNOSIS — Z23 Encounter for immunization: Secondary | ICD-10-CM | POA: Diagnosis not present

## 2023-10-17 DIAGNOSIS — I129 Hypertensive chronic kidney disease with stage 1 through stage 4 chronic kidney disease, or unspecified chronic kidney disease: Secondary | ICD-10-CM | POA: Diagnosis not present

## 2023-10-17 DIAGNOSIS — L4050 Arthropathic psoriasis, unspecified: Secondary | ICD-10-CM | POA: Diagnosis not present

## 2023-10-17 DIAGNOSIS — N1832 Chronic kidney disease, stage 3b: Secondary | ICD-10-CM | POA: Diagnosis not present

## 2023-10-17 DIAGNOSIS — M109 Gout, unspecified: Secondary | ICD-10-CM | POA: Diagnosis not present

## 2023-10-21 ENCOUNTER — Encounter (HOSPITAL_COMMUNITY): Payer: Self-pay

## 2023-10-21 ENCOUNTER — Other Ambulatory Visit (HOSPITAL_COMMUNITY): Payer: Self-pay

## 2023-10-21 ENCOUNTER — Other Ambulatory Visit: Payer: Self-pay

## 2023-10-21 DIAGNOSIS — Z436 Encounter for attention to other artificial openings of urinary tract: Secondary | ICD-10-CM | POA: Diagnosis not present

## 2023-10-21 MED ORDER — SKYRIZI PEN 150 MG/ML ~~LOC~~ SOAJ: SUBCUTANEOUS | 4 refills | Status: AC

## 2023-10-22 ENCOUNTER — Other Ambulatory Visit (HOSPITAL_BASED_OUTPATIENT_CLINIC_OR_DEPARTMENT_OTHER): Payer: Self-pay

## 2023-10-22 ENCOUNTER — Telehealth (HOSPITAL_COMMUNITY): Payer: Self-pay | Admitting: Pharmacy Technician

## 2023-10-22 ENCOUNTER — Other Ambulatory Visit (HOSPITAL_COMMUNITY): Payer: Self-pay

## 2023-10-22 ENCOUNTER — Other Ambulatory Visit: Payer: Self-pay

## 2023-10-22 MED ORDER — SKYRIZI PEN 150 MG/ML ~~LOC~~ SOAJ: SUBCUTANEOUS | 4 refills | Status: AC

## 2023-10-22 MED ORDER — SKYRIZI PEN 150 MG/ML ~~LOC~~ SOAJ: SUBCUTANEOUS | 5 refills | Status: AC

## 2023-10-22 NOTE — Telephone Encounter (Signed)
 Daughter, Rea, returned call, advised they have submitted PAP paperwork to [employer] and expect to receive a determination in 10 business days.

## 2023-10-22 NOTE — Telephone Encounter (Signed)
 Received new Rx at [facility], ran test claim- PA not required at this time, patient's copay is $1,432.78- patient likely to benefit from MPPP or Manufacturer Assistance. Called patient, left message.

## 2023-10-22 NOTE — Progress Notes (Signed)
 Daughter, Rea, returned call, advised they have submitted PAP paperwork to [employer] and expect to receive a determination in 10 business days.

## 2023-10-23 ENCOUNTER — Other Ambulatory Visit: Payer: Self-pay

## 2023-11-01 ENCOUNTER — Other Ambulatory Visit: Payer: Self-pay

## 2023-11-08 DIAGNOSIS — I872 Venous insufficiency (chronic) (peripheral): Secondary | ICD-10-CM | POA: Diagnosis not present

## 2023-11-08 DIAGNOSIS — X32XXXD Exposure to sunlight, subsequent encounter: Secondary | ICD-10-CM | POA: Diagnosis not present

## 2023-11-08 DIAGNOSIS — L570 Actinic keratosis: Secondary | ICD-10-CM | POA: Diagnosis not present

## 2023-11-08 DIAGNOSIS — D0439 Carcinoma in situ of skin of other parts of face: Secondary | ICD-10-CM | POA: Diagnosis not present

## 2023-11-12 ENCOUNTER — Other Ambulatory Visit: Payer: Self-pay

## 2023-11-12 NOTE — Progress Notes (Signed)
 Spoke with daughter, Jeremiah Wright was approved for manufacturer assistance. Dis-enrolling.

## 2023-12-19 DIAGNOSIS — E669 Obesity, unspecified: Secondary | ICD-10-CM | POA: Diagnosis not present

## 2023-12-19 DIAGNOSIS — Z6834 Body mass index (BMI) 34.0-34.9, adult: Secondary | ICD-10-CM | POA: Diagnosis not present

## 2023-12-19 DIAGNOSIS — L4050 Arthropathic psoriasis, unspecified: Secondary | ICD-10-CM | POA: Diagnosis not present

## 2023-12-19 DIAGNOSIS — M109 Gout, unspecified: Secondary | ICD-10-CM | POA: Diagnosis not present

## 2023-12-19 DIAGNOSIS — M79642 Pain in left hand: Secondary | ICD-10-CM | POA: Diagnosis not present

## 2023-12-19 DIAGNOSIS — M2560 Stiffness of unspecified joint, not elsewhere classified: Secondary | ICD-10-CM | POA: Diagnosis not present

## 2023-12-19 DIAGNOSIS — M2540 Effusion, unspecified joint: Secondary | ICD-10-CM | POA: Diagnosis not present

## 2023-12-19 DIAGNOSIS — M79641 Pain in right hand: Secondary | ICD-10-CM | POA: Diagnosis not present

## 2023-12-20 DIAGNOSIS — L570 Actinic keratosis: Secondary | ICD-10-CM | POA: Diagnosis not present

## 2023-12-20 DIAGNOSIS — Z85828 Personal history of other malignant neoplasm of skin: Secondary | ICD-10-CM | POA: Diagnosis not present

## 2023-12-20 DIAGNOSIS — Z08 Encounter for follow-up examination after completed treatment for malignant neoplasm: Secondary | ICD-10-CM | POA: Diagnosis not present

## 2023-12-20 DIAGNOSIS — X32XXXD Exposure to sunlight, subsequent encounter: Secondary | ICD-10-CM | POA: Diagnosis not present
